# Patient Record
Sex: Female | Born: 1965 | Race: White | Hispanic: No | Marital: Married | Smoking: Current every day smoker
Health system: Southern US, Community
[De-identification: ages and names within clinical notes are randomized; demographics above are authoritative.]

## PROBLEM LIST (undated history)

## (undated) DIAGNOSIS — F32A Depression, unspecified: Secondary | ICD-10-CM

## (undated) DIAGNOSIS — F1020 Alcohol dependence, uncomplicated: Secondary | ICD-10-CM

## (undated) DIAGNOSIS — J948 Other specified pleural conditions: Secondary | ICD-10-CM

## (undated) DIAGNOSIS — F329 Major depressive disorder, single episode, unspecified: Secondary | ICD-10-CM

## (undated) DIAGNOSIS — Z9289 Personal history of other medical treatment: Secondary | ICD-10-CM

## (undated) DIAGNOSIS — D649 Anemia, unspecified: Secondary | ICD-10-CM

## (undated) DIAGNOSIS — R011 Cardiac murmur, unspecified: Secondary | ICD-10-CM

## (undated) DIAGNOSIS — K7031 Alcoholic cirrhosis of liver with ascites: Principal | ICD-10-CM

## (undated) DIAGNOSIS — F419 Anxiety disorder, unspecified: Secondary | ICD-10-CM

## (undated) HISTORY — DX: Other specified pleural conditions: J94.8

## (undated) HISTORY — PX: REFRACTIVE SURGERY: SHX103

## (undated) HISTORY — DX: Alcohol dependence, uncomplicated: F10.20

## (undated) HISTORY — DX: Alcoholic cirrhosis of liver with ascites: K70.31

## (undated) HISTORY — PX: EYE SURGERY: SHX253

## (undated) HISTORY — PX: WISDOM TOOTH EXTRACTION: SHX21

---

## 1997-10-13 ENCOUNTER — Other Ambulatory Visit: Admission: RE | Admit: 1997-10-13 | Discharge: 1997-10-13 | Payer: Self-pay | Admitting: Gynecology

## 1999-01-06 ENCOUNTER — Other Ambulatory Visit: Admission: RE | Admit: 1999-01-06 | Discharge: 1999-01-06 | Payer: Self-pay | Admitting: Gynecology

## 2001-04-13 ENCOUNTER — Other Ambulatory Visit: Admission: RE | Admit: 2001-04-13 | Discharge: 2001-04-13 | Payer: Self-pay | Admitting: Gynecology

## 2001-08-08 HISTORY — PX: FERTILITY SURGERY: SHX945

## 2004-10-18 ENCOUNTER — Other Ambulatory Visit: Admission: RE | Admit: 2004-10-18 | Discharge: 2004-10-18 | Payer: Self-pay | Admitting: Gynecology

## 2005-12-08 ENCOUNTER — Other Ambulatory Visit: Admission: RE | Admit: 2005-12-08 | Discharge: 2005-12-08 | Payer: Self-pay | Admitting: Gynecology

## 2007-03-26 ENCOUNTER — Other Ambulatory Visit: Admission: RE | Admit: 2007-03-26 | Discharge: 2007-03-26 | Payer: Self-pay | Admitting: Gynecology

## 2008-04-03 ENCOUNTER — Other Ambulatory Visit: Admission: RE | Admit: 2008-04-03 | Discharge: 2008-04-03 | Payer: Self-pay | Admitting: Gynecology

## 2017-01-06 DIAGNOSIS — Z9289 Personal history of other medical treatment: Secondary | ICD-10-CM

## 2017-01-06 HISTORY — DX: Personal history of other medical treatment: Z92.89

## 2017-01-06 HISTORY — PX: COLONOSCOPY: SHX174

## 2017-01-06 HISTORY — PX: ESOPHAGOGASTRODUODENOSCOPY: SHX1529

## 2017-01-18 ENCOUNTER — Other Ambulatory Visit: Payer: Self-pay | Admitting: Family Medicine

## 2017-01-18 ENCOUNTER — Encounter: Payer: Self-pay | Admitting: Physician Assistant

## 2017-01-18 DIAGNOSIS — R945 Abnormal results of liver function studies: Secondary | ICD-10-CM

## 2017-01-18 DIAGNOSIS — R7989 Other specified abnormal findings of blood chemistry: Secondary | ICD-10-CM

## 2017-01-18 DIAGNOSIS — R160 Hepatomegaly, not elsewhere classified: Secondary | ICD-10-CM

## 2017-01-19 ENCOUNTER — Encounter (HOSPITAL_COMMUNITY): Payer: Self-pay | Admitting: Nurse Practitioner

## 2017-01-19 ENCOUNTER — Observation Stay (HOSPITAL_COMMUNITY)
Admission: EM | Admit: 2017-01-19 | Discharge: 2017-01-20 | Disposition: A | Discharge status: Home / Self Care | Payer: 59 | Attending: Internal Medicine | Admitting: Internal Medicine

## 2017-01-19 DIAGNOSIS — F1020 Alcohol dependence, uncomplicated: Secondary | ICD-10-CM | POA: Diagnosis not present

## 2017-01-19 DIAGNOSIS — J948 Other specified pleural conditions: Secondary | ICD-10-CM | POA: Diagnosis present

## 2017-01-19 DIAGNOSIS — E871 Hypo-osmolality and hyponatremia: Secondary | ICD-10-CM | POA: Diagnosis not present

## 2017-01-19 DIAGNOSIS — K7030 Alcoholic cirrhosis of liver without ascites: Secondary | ICD-10-CM | POA: Diagnosis present

## 2017-01-19 DIAGNOSIS — E43 Unspecified severe protein-calorie malnutrition: Secondary | ICD-10-CM | POA: Diagnosis present

## 2017-01-19 DIAGNOSIS — R197 Diarrhea, unspecified: Secondary | ICD-10-CM | POA: Diagnosis present

## 2017-01-19 DIAGNOSIS — K7031 Alcoholic cirrhosis of liver with ascites: Secondary | ICD-10-CM

## 2017-01-19 DIAGNOSIS — R7989 Other specified abnormal findings of blood chemistry: Secondary | ICD-10-CM | POA: Insufficient documentation

## 2017-01-19 DIAGNOSIS — E860 Dehydration: Secondary | ICD-10-CM

## 2017-01-19 DIAGNOSIS — F17210 Nicotine dependence, cigarettes, uncomplicated: Secondary | ICD-10-CM | POA: Insufficient documentation

## 2017-01-19 DIAGNOSIS — R531 Weakness: Secondary | ICD-10-CM

## 2017-01-19 DIAGNOSIS — D649 Anemia, unspecified: Secondary | ICD-10-CM | POA: Diagnosis not present

## 2017-01-19 DIAGNOSIS — Z79899 Other long term (current) drug therapy: Secondary | ICD-10-CM | POA: Diagnosis not present

## 2017-01-19 DIAGNOSIS — G4700 Insomnia, unspecified: Secondary | ICD-10-CM | POA: Insufficient documentation

## 2017-01-19 DIAGNOSIS — R945 Abnormal results of liver function studies: Secondary | ICD-10-CM

## 2017-01-19 DIAGNOSIS — E876 Hypokalemia: Secondary | ICD-10-CM | POA: Diagnosis not present

## 2017-01-19 DIAGNOSIS — K759 Inflammatory liver disease, unspecified: Secondary | ICD-10-CM | POA: Diagnosis not present

## 2017-01-19 HISTORY — DX: Alcoholic cirrhosis of liver with ascites: K70.31

## 2017-01-19 HISTORY — DX: Anemia, unspecified: D64.9

## 2017-01-19 HISTORY — DX: Anxiety disorder, unspecified: F41.9

## 2017-01-19 LAB — COMPREHENSIVE METABOLIC PANEL
ALT: 29 U/L (ref 14–54)
ANION GAP: 9 (ref 5–15)
AST: 129 U/L — ABNORMAL HIGH (ref 15–41)
Albumin: 2.8 g/dL — ABNORMAL LOW (ref 3.5–5.0)
Alkaline Phosphatase: 139 U/L — ABNORMAL HIGH (ref 38–126)
BUN: <5 mg/dL — ABNORMAL LOW (ref 6–20)
CHLORIDE: 101 mmol/L (ref 101–111)
CO2: 21 mmol/L — AB (ref 22–32)
CREATININE: 0.61 mg/dL (ref 0.44–1.00)
Calcium: 8.4 mg/dL — ABNORMAL LOW (ref 8.9–10.3)
GFR calc non Af Amer: >60 mL/min (ref >60)
GLUCOSE: 137 mg/dL — AB (ref 65–99)
Potassium: 3.4 mmol/L — ABNORMAL LOW (ref 3.5–5.1)
SODIUM: 131 mmol/L — AB (ref 135–145)
Total Bilirubin: 2.8 mg/dL — ABNORMAL HIGH (ref 0.3–1.2)
Total Protein: 7.3 g/dL (ref 6.5–8.1)

## 2017-01-19 LAB — CBC
HEMATOCRIT: 23.4 % — AB (ref 36.0–46.0)
HEMOGLOBIN: 7 g/dL — AB (ref 12.0–15.0)
MCH: 26.6 pg (ref 26.0–34.0)
MCHC: 29.9 g/dL — ABNORMAL LOW (ref 30.0–36.0)
MCV: 89 fL (ref 78.0–100.0)
Platelets: 262 10*3/uL (ref 150–400)
RBC: 2.63 MIL/uL — AB (ref 3.87–5.11)
RDW: 16.6 % — AB (ref 11.5–15.5)
WBC: 8.7 10*3/uL (ref 4.0–10.5)

## 2017-01-19 LAB — BILIRUBIN, FRACTIONATED(TOT/DIR/INDIR)
BILIRUBIN DIRECT: 1.3 mg/dL — AB (ref 0.1–0.5)
BILIRUBIN INDIRECT: 1.6 mg/dL — AB (ref 0.3–0.9)
BILIRUBIN TOTAL: 2.9 mg/dL — AB (ref 0.3–1.2)

## 2017-01-19 LAB — TSH: TSH: 2.591 u[IU]/mL (ref 0.350–4.500)

## 2017-01-19 LAB — IRON AND TIBC
IRON: 36 ug/dL (ref 28–170)
SATURATION RATIOS: 15 % (ref 10.4–31.8)
TIBC: 245 ug/dL — AB (ref 250–450)
UIBC: 209 ug/dL

## 2017-01-19 LAB — PROTIME-INR
INR: 1.42
Prothrombin Time: 17.5 seconds — ABNORMAL HIGH (ref 11.4–15.2)

## 2017-01-19 LAB — FERRITIN: Ferritin: 42 ng/mL (ref 11–307)

## 2017-01-19 LAB — RETICULOCYTES
RBC.: 2.5 MIL/uL — AB (ref 3.87–5.11)
Retic Count, Absolute: 42.5 10*3/uL (ref 19.0–186.0)
Retic Ct Pct: 1.7 % (ref 0.4–3.1)

## 2017-01-19 LAB — VITAMIN B12: Vitamin B-12: 806 pg/mL (ref 180–914)

## 2017-01-19 LAB — LACTATE DEHYDROGENASE: LDH: 155 U/L (ref 98–192)

## 2017-01-19 LAB — I-STAT TROPONIN, ED: Troponin i, poc: 0.01 ng/mL (ref 0.00–0.08)

## 2017-01-19 LAB — FOLATE: Folate: 8.7 ng/mL (ref >5.9)

## 2017-01-19 LAB — ABO/RH: ABO/RH(D): A POS

## 2017-01-19 LAB — OSMOLALITY: Osmolality: 285 mOsm/kg (ref 275–295)

## 2017-01-19 MED ORDER — SODIUM CHLORIDE 0.9 % IV SOLN
INTRAVENOUS | Status: AC
  Administered 2017-01-19: 20:00:00 via INTRAVENOUS

## 2017-01-19 MED ORDER — ONDANSETRON HCL 4 MG PO TABS: 4.0000 mg | ORAL_TABLET | Freq: Four times a day (QID) | ORAL | Status: DC | PRN

## 2017-01-19 MED ORDER — ESCITALOPRAM OXALATE 10 MG PO TABS
20.0000 mg | ORAL_TABLET | Freq: Every day | ORAL | Status: DC
  Administered 2017-01-20: 20 mg via ORAL
  Filled 2017-01-19: qty 2

## 2017-01-19 MED ORDER — ENSURE ENLIVE PO LIQD
237.0000 mL | Freq: Two times a day (BID) | ORAL | Status: DC
  Administered 2017-01-20: 237 mL via ORAL

## 2017-01-19 MED ORDER — ONDANSETRON HCL 4 MG/2ML IJ SOLN: 4.0000 mg | Freq: Four times a day (QID) | INTRAMUSCULAR | Status: DC | PRN

## 2017-01-19 NOTE — ED Provider Notes (Signed)
[facility]-EMERGENCY DEPT Provider Note   CSN: [id number] Arrival date & time: 01/19/17  1608     History   Chief Complaint Chief Complaint  Patient presents with  . Anemia    HPI Rhonda Haley is a 51 y.o. female.  The history is provided by the patient and medical records. No language interpreter was used.  Illness  This is a chronic problem. The current episode started more than 1 week ago. The problem occurs daily. The problem has not changed since onset.Pertinent negatives include no chest pain, no abdominal pain and no shortness of breath. Nothing aggravates the symptoms. The symptoms are relieved by medications.    Past Medical History:  Diagnosis Date  . Anxiety   . Symptomatic anemia 01/19/2017    Patient Active Problem List   Diagnosis Date Noted  . Diarrhea   . Symptomatic anemia 01/19/2017  . Hyponatremia 01/19/2017  . Hypokalemia 01/19/2017  . Elevated LFTs 01/19/2017    Past Surgical History:  Procedure Laterality Date  . FERTILITY SURGERY  2003   in vitro fertilization    OB History    No data available       Home Medications    Prior to Admission medications   Medication Sig Start Date End Date Taking? Authorizing Provider  diphenhydramine-acetaminophen (TYLENOL PM) 25-500 MG TABS tablet Take 1-2 tablets by mouth at bedtime as needed (pain).    Yes [provider]  escitalopram (LEXAPRO) 20 MG tablet Take 20 mg by mouth daily. 01/18/17  Yes [provider]  loratadine (CLARITIN) 10 MG tablet Take 10 mg by mouth daily as needed for allergies.   Yes [provider]  feeding supplement, ENSURE ENLIVE, (ENSURE ENLIVE) LIQD Take 237 mLs by mouth 2 (two) times daily between meals. 01/21/17   Ghimire, Werner LeanShanker M, MD  ferrous sulfate 325 (65 FE) MG tablet Take 1 tablet (325 mg total) by mouth 2 (two) times daily with a meal. 01/20/17   Ghimire, Werner LeanShanker M, MD  loperamide (IMODIUM A-D) 2 MG tablet Take 1 tablet (2 mg total) by mouth 3  (three) times daily as needed for diarrhea or loose stools. 01/20/17   Ghimire, Werner LeanShanker M, MD  thiamine (VITAMIN B-1) 100 MG tablet Take 1 tablet (100 mg total) by mouth daily. 01/20/17   Ghimire, Werner LeanShanker M, MD    Family History Family History  Problem Relation Age of Onset  . Brain cancer Mother     Social History Social History  Substance Use Topics  . Smoking status: Current Every Day Smoker    Packs/day: 0.50    Years: 28.00    Types: Cigarettes  . Smokeless tobacco: Never Used  . Alcohol use Yes     Comment: 01/19/2017 "1 bottle of wine/week; quit 1 wk ago"     Allergies   Patient has no known allergies.   Review of Systems Review of Systems  Constitutional: Positive for fatigue. Negative for chills and fever.  HENT: Negative for ear pain and sore throat.   Eyes: Negative for pain and visual disturbance.  Respiratory: Negative for cough and shortness of breath.   Cardiovascular: Negative for chest pain and palpitations.  Gastrointestinal: Positive for diarrhea. Negative for abdominal pain and vomiting.  Genitourinary: Negative for dysuria and hematuria.  Musculoskeletal: Negative for arthralgias and back pain.  Skin: Negative for color change and rash.  Neurological: Positive for dizziness. Negative for seizures and syncope.  All other systems reviewed and are negative.    Physical Exam  Updated Vital Signs BP 118/69 (BP Location: Right Arm)   Pulse (!) 106   Temp 98.5 F (36.9 C) (Oral)   Resp (!) 24   Ht 5\' 8"  (1.727 m)   Wt 63.9 kg (140 lb 14 oz)   SpO2 99%   BMI 21.42 kg/m   Physical Exam  Constitutional: She appears well-developed. No distress.  HENT:  Head: Normocephalic and atraumatic.  Eyes: Conjunctivae are normal.  Neck: Neck supple.  Cardiovascular: Normal rate and regular rhythm.   No murmur heard. Pulmonary/Chest: Effort normal and breath sounds normal. No respiratory distress.  Abdominal: Soft. There is no tenderness.  Genitourinary:  Rectal exam shows guaiac negative stool.  Musculoskeletal: She exhibits no edema.  Neurological: She is alert. No cranial nerve deficit. Coordination normal.  5/5 motor strength and intact sensation in all extremities. Finger-to-nose intact bilaterally  Skin: Skin is warm and dry.  Nursing note and vitals reviewed.    ED Treatments / Results  Labs (all labs ordered are listed, but only abnormal results are displayed) Labs Reviewed  CBC - Abnormal; Notable for the following:       Result Value   RBC 2.63 (*)    Hemoglobin 7.0 (*)    HCT 23.4 (*)    MCHC 29.9 (*)    RDW 16.6 (*)    All other components within normal limits  COMPREHENSIVE METABOLIC PANEL - Abnormal; Notable for the following:    Sodium 131 (*)    Potassium 3.4 (*)    CO2 21 (*)    Glucose, Bld 137 (*)    BUN <5 (*)    Calcium 8.4 (*)    Albumin 2.8 (*)    AST 129 (*)    Alkaline Phosphatase 139 (*)    Total Bilirubin 2.8 (*)    All other components within normal limits  IRON AND TIBC - Abnormal; Notable for the following:    TIBC 245 (*)    All other components within normal limits  RETICULOCYTES - Abnormal; Notable for the following:    RBC. 2.50 (*)    All other components within normal limits  PROTIME-INR - Abnormal; Notable for the following:    Prothrombin Time 17.5 (*)    All other components within normal limits  BILIRUBIN, FRACTIONATED(TOT/DIR/INDIR) - Abnormal; Notable for the following:    Total Bilirubin 2.9 (*)    Bilirubin, Direct 1.3 (*)    Indirect Bilirubin 1.6 (*)    All other components within normal limits  COMPREHENSIVE METABOLIC PANEL - Abnormal; Notable for the following:    BUN <5 (*)    Calcium 8.2 (*)    Albumin 2.5 (*)    AST 117 (*)    Alkaline Phosphatase 127 (*)    Total Bilirubin 2.8 (*)    All other components within normal limits  CBC - Abnormal; Notable for the following:    RBC 2.50 (*)    Hemoglobin 6.6 (*)    HCT 22.3 (*)    MCHC 29.6 (*)    RDW 16.7 (*)     All other components within normal limits  CBC - Abnormal; Notable for the following:    RBC 2.90 (*)    Hemoglobin 7.9 (*)    HCT 25.3 (*)    RDW 16.4 (*)    All other components within normal limits  GASTROINTESTINAL PANEL BY PCR, STOOL (REPLACES STOOL CULTURE)  FERRITIN  TSH  FOLATE  VITAMIN B12  HIV ANTIBODY (ROUTINE TESTING)  LACTATE  DEHYDROGENASE  HAPTOGLOBIN  OSMOLALITY  SODIUM, URINE, RANDOM  HEPATITIS PANEL, ACUTE  OSMOLALITY, URINE  OCCULT BLOOD X 1 CARD TO LAB, STOOL  CBC  I-STAT TROPOININ, ED  POC OCCULT BLOOD, ED  TYPE AND SCREEN  ABO/RH  PREPARE RBC (CROSSMATCH)    EKG  EKG Interpretation  Date/Time:  Thursday January 19 2017 16:42:03 EDT Ventricular Rate:  127 PR Interval:    QRS Duration: 72 QT Interval:  328 QTC Calculation: 476 R Axis:   74 Text Interpretation:  Accelerated Junctional rhythm with Premature ventricular complexes or Fusion complexes Cannot rule out Anterior infarct , age undetermined Abnormal ECG No acute changes Confirmed by Derwood Kaplan [phone]) on 01/19/2017 6:31:30 PM Also confirmed by Derwood Kaplan [phone]), editor Elita Quick ([id number])  on 01/20/2017 6:38:30 AM       Radiology US Abdomen Complete  Result Date: 01/20/2017 CLINICAL DATA:  Elevated liver function studies, anemia. EXAM: ABDOMEN ULTRASOUND COMPLETE COMPARISON:  None in PACs FINDINGS: Gallbladder: The gallbladder is adequately distended. There are echogenic mobile shadowing stones measuring up to 1 cm in diameter. The gallbladder wall is top normal in thickness at 3 mm. There is no positive sonographic Murphy's sign. There is a trace of fluid adjacent to the gallbladder. Common bile duct: Diameter: 4.6 mm Liver: The hepatic echotexture is heterogeneous. The surface contour is irregular. There is no discrete mass or ductal dilation. There is ascites surrounding the liver. IVC: No abnormality visualized. Pancreas: Visualization of the pancreatic tail is limited due to  bowel gas. Spleen: The spleen is mildly enlarged measuring 12.6 x 11.2 x 5.7 cm. The calculated volume is 420 cc. Right Kidney: Length: 11.7 cm. Echogenicity within normal limits. No mass or hydronephrosis visualized. Left Kidney: Length: 11.4 cm. Echogenicity within normal limits. No mass or hydronephrosis visualized. Abdominal aorta: Visualization of the abdominal aorta is limited due to bowel gas. Other findings: There is a right pleural effusion. IMPRESSION: Abnormal appearance of the liver worrisome for cirrhosis or other hepatocellular infiltrative process. Right pleural effusion. Small amount of ascites. Splenomegaly. Hepatic protocol MRI is recommended. Gallstones without sonographic evidence of acute cholecystitis. Limited visualization of the pancreatic tail. Electronically Signed   By: David  [place] M.D.   On: 01/20/2017 07:52    Procedures Procedures (including critical care time)  Medications Ordered in ED Medications  escitalopram (LEXAPRO) tablet 20 mg (20 mg Oral Given 01/20/17 1114)  ondansetron (ZOFRAN) tablet 4 mg (not administered)    Or  ondansetron (ZOFRAN) injection 4 mg (not administered)  0.9 %  sodium chloride infusion ( Intravenous New Bag/Given 01/19/17 2029)  feeding supplement (ENSURE ENLIVE) (ENSURE ENLIVE) liquid 237 mL (237 mLs Oral Not Given 01/20/17 1535)  0.9 %  sodium chloride infusion ( Intravenous New Bag/Given 01/20/17 0855)  acetaminophen (TYLENOL) tablet 650 mg (650 mg Oral Given 01/20/17 0850)  diphenhydrAMINE (BENADRYL) injection 25 mg (25 mg Intravenous Given 01/20/17 0849)  furosemide (LASIX) injection 20 mg (20 mg Intravenous Given 01/20/17 0850)     Initial Impression / Assessment and Plan / ED Course  I have reviewed the triage vital signs and the nursing notes.  Pertinent labs & imaging results that were available during my care of the patient were reviewed by me and considered in my medical decision making (see chart for details).     78 yoF  sent by [facility] PCP for symptomatic anemia. Onset of diarrhea over past month after mother was ill. Diarrhea typical after stress. However, diarrhea not improving like  normal. Pt with generalized fatigue, lightheadedness, and nausea.   AF, tachycardic 120s, otherwise VSS. No focal neuro deficits. Lungs CTAB. Abdomen soft, benign throughout. Green fecal matter on recal exam, no gross blood. HemOccult negative.  Unclear etiology for normocytic anemia. Pt admitted for symptomatic anemia. Pt stable at time of transfer.  Final Clinical Impressions(s) / ED Diagnoses   Final diagnoses:  Elevated LFTs    New Prescriptions Discharge Medication List as of 01/20/2017  6:34 PM    START taking these medications   Details  feeding supplement, ENSURE ENLIVE, (ENSURE ENLIVE) LIQD Take 237 mLs by mouth 2 (two) times daily between meals., Starting Sat 01/21/2017, Print    ferrous sulfate 325 (65 FE) MG tablet Take 1 tablet (325 mg total) by mouth 2 (two) times daily with a meal., Starting Fri 01/20/2017, Print    loperamide (IMODIUM A-D) 2 MG tablet Take 1 tablet (2 mg total) by mouth 3 (three) times daily as needed for diarrhea or loose stools., Starting Fri 01/20/2017, Print    thiamine (VITAMIN B-1) 100 MG tablet Take 1 tablet (100 mg total) by mouth daily., Starting Fri 01/20/2017, Print         Hebert Soho, MD 01/20/17 1610    Derwood Kaplan, MD 01/22/17 [phone]

## 2017-01-19 NOTE — ED Notes (Signed)
Admitting at bedside 

## 2017-01-19 NOTE — ED Notes (Signed)
EDP at bedside. MD aware of Hgb 7.0 and occult card at bedside for MD.

## 2017-01-19 NOTE — ED Triage Notes (Signed)
Pt presents with c/o anemia. [facility] after her lab work returned with low hemoglobin. She saw Dr Duanne Guessewey in the office recently for a several month history of fatigue, tachycardia and diarrhea. She denies any fevers, dizziness,  shortness of breath, nausea, vomiting, pain. Her symptoms began after a period of increased stress related to her mother being ill.

## 2017-01-20 ENCOUNTER — Observation Stay (HOSPITAL_COMMUNITY): Payer: 59

## 2017-01-20 DIAGNOSIS — R197 Diarrhea, unspecified: Secondary | ICD-10-CM

## 2017-01-20 DIAGNOSIS — D649 Anemia, unspecified: Secondary | ICD-10-CM

## 2017-01-20 DIAGNOSIS — K7031 Alcoholic cirrhosis of liver with ascites: Secondary | ICD-10-CM | POA: Diagnosis not present

## 2017-01-20 DIAGNOSIS — R7989 Other specified abnormal findings of blood chemistry: Secondary | ICD-10-CM

## 2017-01-20 LAB — COMPREHENSIVE METABOLIC PANEL
ALBUMIN: 2.5 g/dL — AB (ref 3.5–5.0)
ALK PHOS: 127 U/L — AB (ref 38–126)
ALT: 27 U/L (ref 14–54)
ANION GAP: 9 (ref 5–15)
AST: 117 U/L — AB (ref 15–41)
BUN: <5 mg/dL — ABNORMAL LOW (ref 6–20)
CO2: 24 mmol/L (ref 22–32)
Calcium: 8.2 mg/dL — ABNORMAL LOW (ref 8.9–10.3)
Chloride: 102 mmol/L (ref 101–111)
Creatinine, Ser: 0.53 mg/dL (ref 0.44–1.00)
GFR calc Af Amer: >60 mL/min (ref >60)
GFR calc non Af Amer: >60 mL/min (ref >60)
Glucose, Bld: 99 mg/dL (ref 65–99)
POTASSIUM: 3.8 mmol/L (ref 3.5–5.1)
SODIUM: 135 mmol/L (ref 135–145)
Total Bilirubin: 2.8 mg/dL — ABNORMAL HIGH (ref 0.3–1.2)
Total Protein: 6.9 g/dL (ref 6.5–8.1)

## 2017-01-20 LAB — GASTROINTESTINAL PANEL BY PCR, STOOL (REPLACES STOOL CULTURE)
ADENOVIRUS F40/41: NOT DETECTED
Astrovirus: NOT DETECTED
CRYPTOSPORIDIUM: NOT DETECTED
CYCLOSPORA CAYETANENSIS: NOT DETECTED
Campylobacter species: NOT DETECTED
ENTEROAGGREGATIVE E COLI (EAEC): NOT DETECTED
ENTEROPATHOGENIC E COLI (EPEC): NOT DETECTED
Entamoeba histolytica: NOT DETECTED
Enterotoxigenic E coli (ETEC): NOT DETECTED
GIARDIA LAMBLIA: NOT DETECTED
Norovirus GI/GII: NOT DETECTED
PLESIMONAS SHIGELLOIDES: NOT DETECTED
Rotavirus A: NOT DETECTED
Salmonella species: NOT DETECTED
Sapovirus (I, II, IV, and V): NOT DETECTED
Shiga like toxin producing E coli (STEC): NOT DETECTED
Shigella/Enteroinvasive E coli (EIEC): NOT DETECTED
VIBRIO SPECIES: NOT DETECTED
Vibrio cholerae: NOT DETECTED
YERSINIA ENTEROCOLITICA: NOT DETECTED

## 2017-01-20 LAB — CBC
HCT: 25.3 % — ABNORMAL LOW (ref 36.0–46.0)
HEMATOCRIT: 22.3 % — AB (ref 36.0–46.0)
HEMOGLOBIN: 7.9 g/dL — AB (ref 12.0–15.0)
Hemoglobin: 6.6 g/dL — CL (ref 12.0–15.0)
MCH: 26.4 pg (ref 26.0–34.0)
MCH: 27.2 pg (ref 26.0–34.0)
MCHC: 29.6 g/dL — AB (ref 30.0–36.0)
MCHC: 31.2 g/dL (ref 30.0–36.0)
MCV: 89.2 fL (ref 78.0–100.0)
MCV: 87.2 fL (ref 78.0–100.0)
Platelets: 245 10*3/uL (ref 150–400)
Platelets: 237 10*3/uL (ref 150–400)
RBC: 2.5 MIL/uL — ABNORMAL LOW (ref 3.87–5.11)
RBC: 2.9 MIL/uL — ABNORMAL LOW (ref 3.87–5.11)
RDW: 16.7 % — ABNORMAL HIGH (ref 11.5–15.5)
RDW: 16.4 % — ABNORMAL HIGH (ref 11.5–15.5)
WBC: 8.6 10*3/uL (ref 4.0–10.5)
WBC: 8.9 10*3/uL (ref 4.0–10.5)

## 2017-01-20 LAB — HEPATITIS PANEL, ACUTE
HCV Ab: 0.1 s/co ratio (ref 0.0–0.9)
Hep A IgM: NEGATIVE
Hep B C IgM: NEGATIVE
Hepatitis B Surface Ag: NEGATIVE

## 2017-01-20 LAB — SODIUM, URINE, RANDOM: Sodium, Ur: 40 mmol/L

## 2017-01-20 LAB — PREPARE RBC (CROSSMATCH)

## 2017-01-20 LAB — HIV ANTIBODY (ROUTINE TESTING W REFLEX): HIV SCREEN 4TH GENERATION: NONREACTIVE

## 2017-01-20 LAB — OSMOLALITY, URINE: Osmolality, Ur: 584 mOsm/kg (ref 300–900)

## 2017-01-20 LAB — HAPTOGLOBIN: Haptoglobin: 152 mg/dL (ref 34–200)

## 2017-01-20 LAB — OCCULT BLOOD X 1 CARD TO LAB, STOOL: FECAL OCCULT BLD: NEGATIVE

## 2017-01-20 MED ORDER — ENSURE ENLIVE PO LIQD: 237.0000 mL | Freq: Two times a day (BID) | ORAL | 0 refills | Status: AC

## 2017-01-20 MED ORDER — FUROSEMIDE 10 MG/ML IJ SOLN
20.0000 mg | Freq: Once | INTRAMUSCULAR | Status: AC
  Administered 2017-01-20: 20 mg via INTRAVENOUS
  Filled 2017-01-20: qty 2

## 2017-01-20 MED ORDER — DIPHENHYDRAMINE HCL 50 MG/ML IJ SOLN
25.0000 mg | Freq: Once | INTRAMUSCULAR | Status: AC
  Administered 2017-01-20: 25 mg via INTRAVENOUS
  Filled 2017-01-20: qty 1

## 2017-01-20 MED ORDER — SODIUM CHLORIDE 0.9 % IV SOLN
Freq: Once | INTRAVENOUS | Status: AC
  Administered 2017-01-20: 09:00:00 via INTRAVENOUS

## 2017-01-20 MED ORDER — LOPERAMIDE HCL 2 MG PO TABS: 2.0000 mg | ORAL_TABLET | Freq: Three times a day (TID) | ORAL | 0 refills | Status: DC | PRN

## 2017-01-20 MED ORDER — ACETAMINOPHEN 325 MG PO TABS
650.0000 mg | ORAL_TABLET | Freq: Once | ORAL | Status: AC
  Administered 2017-01-20: 650 mg via ORAL
  Filled 2017-01-20: qty 2

## 2017-01-20 MED ORDER — VITAMIN B-1 100 MG PO TABS: 100.0000 mg | ORAL_TABLET | Freq: Every day | ORAL | 0 refills | Status: DC

## 2017-01-20 MED ORDER — FERROUS SULFATE 325 (65 FE) MG PO TABS: 325.0000 mg | ORAL_TABLET | Freq: Two times a day (BID) | ORAL | 0 refills | Status: DC

## 2017-01-20 NOTE — Progress Notes (Signed)
Per report from ED nurse, anticipating transfusion of blood for patient. No active orders for blood transfusion. On call notified to clarify about blood transfusion.

## 2017-01-20 NOTE — Progress Notes (Signed)
PROGRESS NOTE [facility]   Rhonda Haley   WUJ:[id number] DOB: July 12, 1966  DOA: 01/19/2017 PCP: [facility], Rhonda Haley   Brief Narrative:  51 year-old female with significant Hx for anxiety and heavy drinking admitted for anemia. Presented with Non-bloody/nonmelanotic diarrhea, general malaise, fatigue, insomnia. No epistaxis, hemoptysis, hematuria, hematochezia, hematemesis. See below for further details.    Assessment & Plan:  1. Symptomatic anemia:  Unclear cause, hypoproliferative vs chronic blood loss.  Nothing in history to suggest acute blood loss.   -Retic Count 42.5, Iron 36, TIBC 245, B12 806,LDH 155, haptoglobin 152, FOBT- negative -Discuss endoscopy with GI as inpatient vs outpatient  2. Hepatitis:  -hepatitis serologies- Negative -T.Bili 2.8, AST 117, ALT 27, INR 1.42, albumin 2.5 -RUQ US- see results below -MRI- recommended -Consult GI  3. Hyponatremia:  -Resolved -continue to monitor  4. Hypokalemia:  -Resolved -continue to monitor  5. Anxiety:  -Continue escitalopram  DVT prophylaxis: SCDs  Code Status: FULL  Family Communication: None at bedside Disposition Plan: Pending results  Subjective: Patient states she's had increasing fatigue for a few weeks with intermittent SOB/cough and non-bloody diarrhea, until the past week when she experienced diarrhea daily. She states she is feeling better since being admitted yesterday but still has fatigue and diarrhea (x4 this morning). Patient denies N/V, chest pain, hemoptysis, hematochezia, hematemesis.   Objective: Vitals:   01/19/17 1915 01/19/17 1945 01/19/17 2031 01/20/17 0456  BP: (!) 142/74 132/72 120/68 111/67  Pulse: (!) 117 (!) 118 (!) 123 (!) 110  Resp:   18 18  Temp:   99.4 F (37.4 C) 98.7 F (37.1 C)  TempSrc:   Oral Oral  SpO2: 92% 96% 95% 99%  Weight:   140 lb 14 oz (63.9 kg)   Height:   5\' 8"  (1.727 m)     Intake/Output Summary (Last 24 hours) at 01/20/17 0905 Last data  filed at 01/20/17 [id number]  Gross per 24 hour  Intake              950 ml  Output                0 ml  Net              950 ml   Filed Weights   01/19/17 1635 01/19/17 2031  Weight: 141 lb (64 kg) 140 lb 14 oz (63.9 kg)    Examination:  General exam: Appears slightly anxious but in no acute distress Respiratory system: Clear to auscultation. No wheezes,crackle or rhonchi Cardiovascular system: S1 & S2 heard, RRR. No JVD, murmurs, rubs or gallops Gastrointestinal system: Abdomen positive for distention, nontender. Suspected hepatomegaly  Central nervous system: Alert and oriented. No focal neurological deficits. Extremities: No pedal edema. Skin: No jaundice, no lacerations or ulcerations Psychiatry: Judgement and insight appear normal. Mood & affect appropriate.    Data Reviewed: I have personally reviewed following labs and imaging studies  CBC:  Recent Labs Lab 01/19/17 1646 01/20/17 0458  WBC 8.7 8.6  HGB 7.0* 6.6*  HCT 23.4* 22.3*  MCV 89.0 89.2  PLT 262 245   Basic Metabolic Panel:  Recent Labs Lab 01/19/17 1646 01/20/17 0458  NA 131* 135  K 3.4* 3.8  CL 101 102  CO2 21* 24  GLUCOSE 137* 99  BUN <5* <5*  CREATININE 0.61 0.53  CALCIUM 8.4* 8.2*   GFR: Estimated Creatinine Clearance: 82 mL/min (by C-G formula based on SCr of 0.53 mg/dL). Liver Function Tests:  Recent Labs Lab  01/19/17 1646 01/19/17 2010 01/20/17 0458  AST 129*  --  117*  ALT 29  --  27  ALKPHOS 139*  --  127*  BILITOT 2.8* 2.9* 2.8*  PROT 7.3  --  6.9  ALBUMIN 2.8*  --  2.5*   No results for input(s): LIPASE, AMYLASE in the last 168 hours. No results for input(s): AMMONIA in the last 168 hours. Coagulation Profile:  Recent Labs Lab 01/19/17 2010  INR 1.42   Cardiac Enzymes: No results for input(s): CKTOTAL, CKMB, CKMBINDEX, TROPONINI in the last 168 hours. BNP (last 3 results) No results for input(s): PROBNP in the last 8760 hours. HbA1C: No results for input(s): HGBA1C  in the last 72 hours. CBG: No results for input(s): GLUCAP in the last 168 hours. Lipid Profile: No results for input(s): CHOL, HDL, LDLCALC, TRIG, CHOLHDL, LDLDIRECT in the last 72 hours. Thyroid Function Tests:  Recent Labs  01/19/17 2010  TSH 2.591   Anemia Panel:  Recent Labs  01/19/17 2010  VITAMINB12 806  FOLATE 8.7  FERRITIN 42  TIBC 245*  IRON 36  RETICCTPCT 1.7   Sepsis Labs: No results for input(s): PROCALCITON, LATICACIDVEN in the last 168 hours.  No results found for this or any previous visit (from the past 240 hour(s)).       Radiology Studies: US Abdomen Complete  Result Date: 01/20/2017 CLINICAL DATA:  Elevated liver function studies, anemia. EXAM: ABDOMEN ULTRASOUND COMPLETE COMPARISON:  None in PACs FINDINGS: Gallbladder: The gallbladder is adequately distended. There are echogenic mobile shadowing stones measuring up to 1 cm in diameter. The gallbladder wall is top normal in thickness at 3 mm. There is no positive sonographic Murphy's sign. There is a trace of fluid adjacent to the gallbladder. Common bile duct: Diameter: 4.6 mm Liver: The hepatic echotexture is heterogeneous. The surface contour is irregular. There is no discrete mass or ductal dilation. There is ascites surrounding the liver. IVC: No abnormality visualized. Pancreas: Visualization of the pancreatic tail is limited due to bowel gas. Spleen: The spleen is mildly enlarged measuring 12.6 x 11.2 x 5.7 cm. The calculated volume is 420 cc. Right Kidney: Length: 11.7 cm. Echogenicity within normal limits. No mass or hydronephrosis visualized. Left Kidney: Length: 11.4 cm. Echogenicity within normal limits. No mass or hydronephrosis visualized. Abdominal aorta: Visualization of the abdominal aorta is limited due to bowel gas. Other findings: There is a right pleural effusion. IMPRESSION: Abnormal appearance of the liver worrisome for cirrhosis or other hepatocellular infiltrative process. Right pleural  effusion. Small amount of ascites. Splenomegaly. Hepatic protocol MRI is recommended. Gallstones without sonographic evidence of acute cholecystitis. Limited visualization of the pancreatic tail. Electronically Signed   By: Rhonda  Haley M.D.   On: 01/20/2017 07:52      Scheduled Meds: . escitalopram  20 mg Oral Daily  . feeding supplement (ENSURE ENLIVE)  237 mL Oral BID BM   Continuous Infusions:   LOS: 0 days     Ikran Patman,PA-s If 7PM-7AM, please contact night-coverage [email or web address] Password [email or web address] 01/20/2017, 9:05 AM

## 2017-01-20 NOTE — Consult Note (Addendum)
Consultation  Referring Provider: Maxie Barb MD     Primary Care Physician:  Lewis Moccasin, MD Primary Gastroenterologist: Stan Head, MD        Reason for Consultation: Anemia        Impression / Plan:   1. Normocytic Anemia - Mildly symptomatic with reports of fatigue. 1 unit has been transfused. Hg pre transfusion was 6.6. FOBT negative. Iron studies, folate, B12 normal. Haptoglobin normal. Etiology may be related to splenomegaly and cirrhosis. Considering EGD evaluation. *Post transfusion Hg is 7.9*   2. Abnormal LFTs - [place] suggests cirrhosis, likely alcoholic.  ALP 139, AST 129, ALT normal at 27. PT slightly prolonged at 17.5 and INR normal. Bilirubin elevated at 2.9 total. Acute Hep A, B, C serology negative. Sodium and potassium slightly low as well. Abdominal ultrasound shows uncomplicated gallstones, splenomegaly, and abnormal apperance of liver worrisome for cirrhosis or other hepatocellular infiltrative process. MRI recommended for further evaluation, but per patient preference, may consider doing this outpatient.   3. Diarrhea - Longstanding problem for patient, usually self limiting and associated with stress. Stool sample has already been collected for GI panel.    I have personally seen the patient, reviewed and repeated key elements of the history and physical and participated in formation of the assessment and plan the student has documented.   She appears to have cirrhosis based on clinical features and [place], alcoholism, and a normocytic iron that has features of anemia of chronic disease. Also w/ some diarrhea - not infectious.  She has good insight and says she will stop drinking.  DC home I will arrange outpatient EGD and colonoscopy Prn loperamide Thiamine, MVI, ferrous sulfate each day Stop EtOH - she plans to look into outpatient treatment Nutritional supplements  Iva Boop, MD, [facility] [phone] (pager) [phone]  after 5 PM, weekends and holidays  01/20/2017 5:39 PM   HPI:   Rhonda Haley is a 51 y.o. female with PMH of anxiety who presented to the ED yesterday with anemia. She reports that she had been experiencing "stress-induced" diarrhea - a problem she's had in the past - while dealing with some family problems the past few weeks. Usually this resolves on its own, but this time it persisted and she was feeling fatigued. She was having small volume diarrhea up to 10x / day, which was reportedly not bloody. Then one week ago, a routine Hgb check at [facility] showed she was anemic, and follow up check with PCP on 6/12 showed Hg of 7 and abnormal LFT's, so she was advised to go to the ED for a transfusion. She also made an appointment with [place] for liver evaluation.   She denies headaches, dizziness, nausea, vomiting, changes in vision, pruritis, abdominal pain, symptoms of reflux, dark tarry stools. She has noticed a decrease in appetite and "3-4 lbs" weight loss over the past 10 days or so. She denies heavy use of tylenol, but does report that she has been "a heavy drinker for a long time." She quantifies this as "about 2 glasses of wine per night" or sharing a bottle of wine with her husband. She says her last drink was about 10 days ago when she first saw the elevated LFT's with her PCP. She has noticed changes in her abdominal girth - and her husband reports first noticing her legs and arms becoming thinner while her abdomen becoming larger about 2 years ago. She is very concerned about her liver. She  has never had a colonoscopy.  Patient has a strong preference for managing these problems outpatient.    Past Medical History:  Diagnosis Date  . Anxiety   . Symptomatic anemia 01/19/2017    Past Surgical History:  Procedure Laterality Date  . FERTILITY SURGERY  2003   in vitro fertilization    Family History  Problem Relation Age of Onset  . Brain cancer Mother      Social History  Substance Use  Topics  . Smoking status: Current Every Day Smoker    Packs/day: 0.50    Years: 28.00    Types: Cigarettes  . Smokeless tobacco: Never Used  . Alcohol use Yes     Comment: 01/19/2017 "1 bottle of wine/week; quit 1 wk ago"    Prior to Admission medications   Medication Sig Start Date End Date Taking? Authorizing Provider  diphenhydramine-acetaminophen (TYLENOL PM) 25-500 MG TABS tablet Take 1-2 tablets by mouth at bedtime as needed (pain).    Yes [provider]  escitalopram (LEXAPRO) 20 MG tablet Take 20 mg by mouth daily. 01/18/17  Yes [provider]  loratadine (CLARITIN) 10 MG tablet Take 10 mg by mouth daily as needed for allergies.   Yes [provider]    Current Facility-Administered Medications  Medication Dose Route Frequency Provider Last Rate Last Dose  . escitalopram (LEXAPRO) tablet 20 mg  20 mg Oral Daily Danford, Earl Liteshristopher P, MD   20 mg at 01/20/17 1114  . feeding supplement (ENSURE ENLIVE) (ENSURE ENLIVE) liquid 237 mL  237 mL Oral BID BM Danford, Earl Liteshristopher P, MD   237 mL at 01/20/17 1114  . ondansetron (ZOFRAN) tablet 4 mg  4 mg Oral Q6H PRN Danford, Earl Liteshristopher P, MD       Or  . ondansetron (ZOFRAN) injection 4 mg  4 mg Intravenous Q6H PRN Alberteen Samanford, Christopher P, MD        Allergies as of 01/19/2017  . (No Known Allergies)     Review of Systems:    This is positive for those things mentioned in the HPI. All other review of systems are negative.       Physical Exam:  Vital signs in last 24 hours: Temp:  [98.3 F (36.8 C)-99.4 F (37.4 C)] 98.4 F (36.9 C) (06/15 1104) Pulse Rate:  [102-135] 102 (06/15 1104) Resp:  [17-27] 24 (06/15 1104) BP: (111-142)/(47-79) 123/65 (06/15 1104) SpO2:  [92 %-99 %] 99 % (06/15 1104) Weight:  [140 lb 14 oz (63.9 kg)-141 lb (64 kg)] 140 lb 14 oz (63.9 kg) (06/14 2031) Last BM Date: 01/20/17  General:  Somewhat sallow coloring though not overtly jaundiced. Extremities appear emaciated.    Eyes:  anicteric. ENT:   Mouth and posterior pharynx free of lesions.  Neck:   supple w/o thyromegaly or mass.  Lungs: Clear to auscultation bilaterally. Heart:   Systolic murmur noted. S1S2, no rubs,  gallops. Abdomen:  Distended, firm, but non-tender. Hepatosplenomegaly present. BS+  no hernia, or mass  Rectal: Not done. - heme negative stool prior Lymph:  no cervical or supraclavicular adenopathy. Extremities:   Arms and legs very thin. no edema Skin  Angioma on left cheek - multiple spider angiomata on trunk Neuro:  A&O x 3.  Psych:  A little anxious - appropriately concerned about her health.    Data Reviewed:   LAB RESULTS:  Recent Labs  01/19/17 1646 01/20/17 0458  WBC 8.7 8.6  HGB 7.0* 6.6*  HCT 23.4* 22.3*  PLT  262 245   BMET  Recent Labs  01/19/17 1646 01/20/17 0458  NA 131* 135  K 3.4* 3.8  CL 101 102  CO2 21* 24  GLUCOSE 137* 99  BUN <5* <5*  CREATININE 0.61 0.53  CALCIUM 8.4* 8.2*   LFT  Recent Labs  01/19/17 2010 01/20/17 0458  PROT  --  6.9  ALBUMIN  --  2.5*  AST  --  117*  ALT  --  27  ALKPHOS  --  127*  BILITOT 2.9* 2.8*  BILIDIR 1.3*  --   IBILI 1.6*  --    PT/INR  Recent Labs  01/19/17 2010  LABPROT 17.5*  INR 1.42    STUDIES: US Abdomen Complete  Result Date: 01/20/2017 CLINICAL DATA:  Elevated liver function studies, anemia. EXAM: ABDOMEN ULTRASOUND COMPLETE COMPARISON:  None in PACs FINDINGS: Gallbladder: The gallbladder is adequately distended. There are echogenic mobile shadowing stones measuring up to 1 cm in diameter. The gallbladder wall is top normal in thickness at 3 mm. There is no positive sonographic Murphy's sign. There is a trace of fluid adjacent to the gallbladder. Common bile duct: Diameter: 4.6 mm Liver: The hepatic echotexture is heterogeneous. The surface contour is irregular. There is no discrete mass or ductal dilation. There is ascites surrounding the liver. IVC: No abnormality visualized. Pancreas:  Visualization of the pancreatic tail is limited due to bowel gas. Spleen: The spleen is mildly enlarged measuring 12.6 x 11.2 x 5.7 cm. The calculated volume is 420 cc. Right Kidney: Length: 11.7 cm. Echogenicity within normal limits. No mass or hydronephrosis visualized. Left Kidney: Length: 11.4 cm. Echogenicity within normal limits. No mass or hydronephrosis visualized. Abdominal aorta: Visualization of the abdominal aorta is limited due to bowel gas. Other findings: There is a right pleural effusion. IMPRESSION: Abnormal appearance of the liver worrisome for cirrhosis or other hepatocellular infiltrative process. Right pleural effusion. Small amount of ascites. Splenomegaly. Hepatic protocol MRI is recommended. Gallstones without sonographic evidence of acute cholecystitis. Limited visualization of the pancreatic tail. Electronically Signed   By: David  [place] M.D.   On: 01/20/2017 07:52     PREVIOUS ENDOSCOPIES:            None   Thanks  Colin Mulders Spahn, PA-S   LOS: 0 days   @Carl  Sena Slate, MD, [facility] @  01/20/2017, 12:57 PM

## 2017-01-20 NOTE — H&P (Signed)
History and Physical  Patient Name: Rhonda Haley     BJY:[id number]    DOB: 08-14-1965    DOA: 01/19/2017 PCP: Lewis Moccasin, MD  Patient coming from: PCP's office  Chief Complaint: Abnormal labs      HPI: Rhonda Haley is a 51 y.o. female with a past medical history significant for anxiety who presents with anemia.  The patient was in her usual state of health until a few months ago when she started to have nonbloody nonmelanotic diarrhea and just generalized malaise/fatigue, insomnia, and anxiety.  Then a week or so ago, she had a routine Hgb check at her OB-Gyn's office as part of her routine pap smear visit, and they noted that she was anemic, and referred her to her PCP.  At her PCP's office two days ago, she had routine labs done, and today was called that her hemoglobin was 7 g/dL and she should go to the ER.  She reports no melena, hematochezia. No abnormal vaginal bleeding (menopause 2-3 years ago).  No epistaxis, hemoptysis or hematuria.  No prior anemia.  ED course: -Afebrile, heart rate 135, respirations and pulse is normal, blood pressure 136/76 -Na 131, K 3.4, Cr 0.61 (baseline unknown), WBC 8.7K, Hgb 7.0 and normocytic, elevated RDW -AST 129, ALT 29, total bilirubin 2.8 -Troponin negative -ECG showed sinus tachycardia -TRH were asked to evaluate for anemia and elevated LFTs     Other than fatigue, insomnia, diarrhea, and occasional hot flashes as mentioned above, the patient has no other new complaints, but notes a generalized malaise over subacute time frame.  No previous history of liver disease.  She reports that she quit drinking 1 week ago, prior to that drank 2 glasses of wine per day.  However, per PCP notes, that she brings with her, patient endorsed "heavy drinking" in past to PCP.     ROS: Review of Systems  Constitutional: Positive for malaise/fatigue. Negative for chills and fever.  HENT: Negative for nosebleeds.   Respiratory: Negative for hemoptysis.     Gastrointestinal: Negative for blood in stool and melena.  Genitourinary: Negative for hematuria.  Neurological: Positive for weakness.  All other systems reviewed and are negative.         Past Medical History:  Diagnosis Date  . Anxiety   . Symptomatic anemia 01/19/2017    Past Surgical History:  Procedure Laterality Date  . FERTILITY SURGERY  2003   in vitro fertilization    Social History: Patient lives with her husband.  The patient walks unassisted.  Smoker. She is a Transport planner for [employer].  She is from [place].  No Known Allergies  Family history: family history includes Brain cancer in her mother.  Prior to Admission medications   Medication Sig Start Date End Date Taking? Authorizing Provider  diphenhydramine-acetaminophen (TYLENOL PM) 25-500 MG TABS tablet Take 1-2 tablets by mouth at bedtime as needed (pain).    Yes [provider]  escitalopram (LEXAPRO) 20 MG tablet Take 20 mg by mouth daily. 01/18/17  Yes [provider]  loratadine (CLARITIN) 10 MG tablet Take 10 mg by mouth daily as needed for allergies.   Yes [provider]       Physical Exam: BP 120/68 (BP Location: Right Arm)   Pulse (!) 123   Temp 99.4 F (37.4 C) (Oral)   Resp 18   Ht 5\' 8"  (1.727 m)   Wt 63.9 kg (140 lb 14 oz)   SpO2 95%   BMI 21.42  kg/m  General appearance: Thin adult female, alert and in mild distress from malaise.   Eyes: Icteric, conjunctiva pink, lids and lashes normal. PERRL.    ENT: No nasal deformity, discharge, epistaxis.  Hearing normal. OP moist without lesions.   Neck: No neck masses.  Trachea midline.  No thyromegaly/tenderness. Lymph: No cervical or supraclavicular lymphadenopathy. Skin: Warm and dry.  No jaundice.  Spider angiomas everywhere. Cardiac: Tachycardic, regular, nl S1-S2, no murmurs appreciated.  Capillary refill is brisk.  JVP normal.  No LE edema.  Radial and DP pulses 2+ and symmetric. Respiratory: Normal  respiratory rate and rhythm.  CTAB without rales or wheezes. Abdomen: Abdomen soft.  No TTP. No tense or mobile ascites, but abdominal distension.  Suspect hepatomegaly.   MSK: No deformities or effusions.  No cyanosis or clubbing.  Diffuse loss of periphearl muscle mass. Neuro: Cranial nerves normal.  Sensation intact to light touch. Speech is fluent.  Muscle strength normal.    Psych: Sensorium intact and responding to questions, attention normal.  Behavior appropriate.  Affect anxious.  Judgment and insight appear normal.     Labs on Admission:  I have personally reviewed following labs and imaging studies: CBC:  Recent Labs Lab 01/19/17 1646  WBC 8.7  HGB 7.0*  HCT 23.4*  MCV 89.0  PLT 262   Basic Metabolic Panel:  Recent Labs Lab 01/19/17 1646  NA 131*  K 3.4*  CL 101  CO2 21*  GLUCOSE 137*  BUN <5*  CREATININE 0.61  CALCIUM 8.4*   GFR: Estimated Creatinine Clearance: 82 mL/min (by C-G formula based on SCr of 0.61 mg/dL).  Liver Function Tests:  Recent Labs Lab 01/19/17 1646 01/19/17 2010  AST 129*  --   ALT 29  --   ALKPHOS 139*  --   BILITOT 2.8* 2.9*  PROT 7.3  --   ALBUMIN 2.8*  --    Coagulation Profile:  Recent Labs Lab 01/19/17 2010  INR 1.42   Thyroid Function Tests:  Recent Labs  01/19/17 2010  TSH 2.591   Anemia Panel:  Recent Labs  01/19/17 2010  VITAMINB12 806  FOLATE 8.7  FERRITIN 42  TIBC 245*  IRON 36  RETICCTPCT 1.7        EKG: Independently reviewed. Rate 127, sinus tachycardia.        Assessment/Plan Principal Problem:   Symptomatic anemia Active Problems:   Hyponatremia   Hypokalemia   Elevated LFTs  1. Symptomatic anemia:  Unclear cause, hypoproliferative vs chronic blood loss.  Nothing in history to suggest acute blood loss.   -Check reticulocytes -Check iron stores -Check B12, folate -Check LDH, haptoglobin  -Repeat FOBT -Discuss endoscopy with GI as inpatient vs outpatient  2.  Hepatitis:  Seems more concerning finding.  Stigmata of chronic liver disease, jaundice, mildly elevated Tbili, with history of alcoholism -Check hepatitis serologies -Check ferritin -Check INR, albumin -RUQ US ordered -Consult to GI, appreciate cares  3. Hyponatremia:  -Check urine sodium -Check free water clearance -IVF gently overnight and repeat Na in AM  4. Hypokalemia:  Mild  5. Other medications:  -Continue escitalopram for anxiety       DVT prophylaxis: SCDs  Code Status: FULL  Family Communication: Husband at bedside  Disposition Plan: Anticipate transfuse one unit, begin anemia workup.  RUQ US and consult to GI for suspected cirrhosis. Consults called: None overnight Admission status: OBS At the point of initial evaluation, it is my clinical opinion that admission for OBSERVATION is reasonable  and necessary because the patient's presenting complaints in the context of their chronic conditions represent sufficient risk of deterioration or significant morbidity to constitute reasonable grounds for close observation in the hospital setting, but that the patient may be medically stable for discharge from the hospital within 24 to 48 hours.    Medical decision making: Patient seen at 7:20 PM on 01/19/2017.  The patient was discussed with Dr. Lester Kinsman.  What exists of the patient's chart was reviewed in depth and summarized above.  Clinical condition: stable.        Alberteen Sam [facility] Pager [phone]

## 2017-01-20 NOTE — Discharge Summary (Signed)
PATIENT DETAILS Name: Rhonda Haley Age: 51 y.o. Sex: female Date of Birth: May 06, 1966 MRN: [id number]. Admitting Physician: Alberteen Sam, MD WUJ:[id number], Christell Constant, MD  Admit Date: 01/19/2017 Discharge date: 01/20/2017  Recommendations for Outpatient Follow-up:  1. Follow up with PCP in 1-2 weeks 2. Please obtain BMP/CBC in one week 3. Please ensure follow up gastroenterology-for further workup of anemia and possible liver cirrhosis.  Admitted From:  Home  Disposition: Home   Home Health: No  Equipment/Devices: None  Discharge Condition: Stable  CODE STATUS: FULL CODE  Diet recommendation:  Regular ns  Brief Summary: See H&P, Labs, Consult and Test reports for all details in brief,51 year old admitted with several weeks history of fatigue, and intermittent diarrhea for the past few months. Found to have low hemoglobin by PCP, and referred to the ED for further evaluation and treatment.  Brief Hospital Course: Anemia: No overt blood loss-not sure if she had a recent GI bleeding in the context of possible liver cirrhosis/variceal bleeding. Suspect needs endoscopic evaluation at some point-but patient prefers to do this in the outpatient setting. If no obvious etiology found, probably will require hematology evaluation for bone marrow aspiration. Patient was admitted and transfused 1 unit of PRBC, she feels much better, and we will pursue further workup in the outpatient setting. Was evaluated by gastroenterology-they will arrange for outpatient follow-up and further workup.   Diarrhea: Ongoing for the past 2 months.GI pathogen panel was negative, further workup to be done in the outpatient setting. Patient has to keep herself well hydrated. Okay to use as needed Imodium.   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 Ultrasound showed hepatomegaly with possible changes consistent with cirrhosis. As noted above-further  evaluation to be done in the outpatient setting.  Alcohol abuse: He acknowledges being a heavy drinking a few years back-currently claims to drink only 3-4 glasses of wine on a weekly basis. The patient is very keen on quitting alcohol-claims last drink was a week back.  Procedures/Studies: None  Discharge Diagnoses:  Principal Problem:   Symptomatic anemia Active Problems:   Hyponatremia   Hypokalemia   Elevated LFTs   Diarrhea  Discharge Instructions:  Activity:  As tolerated    Discharge Instructions    Call MD for:  difficulty breathing, headache or visual disturbances    Complete by:  As directed    Diet - low sodium heart healthy    Complete by:  As directed    Discharge instructions    Complete by:  As directed    Follow with Primary MD  Lewis Moccasin, MD in 1 week for a repeat complete blood panel.  Please follow up with gastroenterology as scheduled.  Please get a complete blood count and chemistry panel checked by your Primary MD at your next visit, and again as instructed by your Primary MD.  Get Medicines reviewed and adjusted: Please take all your medications with you for your next visit with your Primary MD  Laboratory/radiological data: Please request your Primary MD to go over all hospital tests and procedure/radiological results at the follow up, please ask your Primary MD to get all Hospital records sent to his/her office.  In some cases, they will be blood work, cultures and biopsy results pending at the time of your discharge. Please request that your primary care M.D. follows up on these results.  Also Note the following: If you experience worsening of your admission symptoms, develop shortness of breath, life threatening emergency,  suicidal or homicidal thoughts you must seek medical attention immediately by calling 911 or calling your MD immediately  if symptoms less severe.  You must read complete instructions/literature along with all the  possible adverse reactions/side effects for all the Medicines you take and that have been prescribed to you. Take any new Medicines after you have completely understood and accpet all the possible adverse reactions/side effects.   Do not drive when taking Pain medications or sleeping medications (Benzodaizepines)  Do not take more than prescribed Pain, Sleep and Anxiety Medications. It is not advisable to combine anxiety,sleep and pain medications without talking with your primary care practitioner  Special Instructions: If you have smoked or chewed Tobacco  in the last 2 yrs please stop smoking, stop any regular Alcohol  and or any Recreational drug use.  Wear Seat belts while driving.  Please note: You were cared for by a hospitalist during your hospital stay. Once you are discharged, your primary care physician will handle any further medical issues. Please note that NO REFILLS for any discharge medications will be authorized once you are discharged, as it is imperative that you return to your primary care physician (or establish a relationship with a primary care physician if you do not have one) for your post hospital discharge needs so that they can reassess your need for medications and monitor your lab values.   Increase activity slowly    Complete by:  As directed      Allergies as of 01/20/2017   No Known Allergies     Medication List    TAKE these medications   diphenhydramine-acetaminophen 25-500 MG Tabs tablet Commonly known as:  TYLENOL PM Take 1-2 tablets by mouth at bedtime as needed (pain).   escitalopram 20 MG tablet Commonly known as:  LEXAPRO Take 20 mg by mouth daily.   feeding supplement (ENSURE ENLIVE) Liqd Take 237 mLs by mouth 2 (two) times daily between meals. Start taking on:  01/21/2017   ferrous sulfate 325 (65 FE) MG tablet Take 1 tablet (325 mg total) by mouth 2 (two) times daily with a meal.   loperamide 2 MG tablet Commonly known as:  IMODIUM  A-D Take 1 tablet (2 mg total) by mouth 3 (three) times daily as needed for diarrhea or loose stools.   loratadine 10 MG tablet Commonly known as:  CLARITIN Take 10 mg by mouth daily as needed for allergies.   thiamine 100 MG tablet Commonly known as:  VITAMIN B-1 Take 1 tablet (100 mg total) by mouth daily.      Follow-up Information    Lewis Moccasin, MD. Schedule an appointment as soon as possible for a visit in 1 week(s).   Specialty:  Family Medicine Contact information: [address] [place] [address] [phone]          No Known Allergies  Consultations:   GI  Other Procedures/Studies: US Abdomen Complete  Result Date: 01/20/2017 CLINICAL DATA:  Elevated liver function studies, anemia. EXAM: ABDOMEN ULTRASOUND COMPLETE COMPARISON:  None in PACs FINDINGS: Gallbladder: The gallbladder is adequately distended. There are echogenic mobile shadowing stones measuring up to 1 cm in diameter. The gallbladder wall is top normal in thickness at 3 mm. There is no positive sonographic Murphy's sign. There is a trace of fluid adjacent to the gallbladder. Common bile duct: Diameter: 4.6 mm Liver: The hepatic echotexture is heterogeneous. The surface contour is irregular. There is no discrete mass or ductal dilation. There  is ascites surrounding the liver. IVC: No abnormality visualized. Pancreas: Visualization of the pancreatic tail is limited due to bowel gas. Spleen: The spleen is mildly enlarged measuring 12.6 x 11.2 x 5.7 cm. The calculated volume is 420 cc. Right Kidney: Length: 11.7 cm. Echogenicity within normal limits. No mass or hydronephrosis visualized. Left Kidney: Length: 11.4 cm. Echogenicity within normal limits. No mass or hydronephrosis visualized. Abdominal aorta: Visualization of the abdominal aorta is limited due to bowel gas. Other findings: There is a right pleural effusion. IMPRESSION: Abnormal appearance of the liver worrisome for cirrhosis or other  hepatocellular infiltrative process. Right pleural effusion. Small amount of ascites. Splenomegaly. Hepatic protocol MRI is recommended. Gallstones without sonographic evidence of acute cholecystitis. Limited visualization of the pancreatic tail. Electronically Signed   By: David  [place] M.D.   On: 01/20/2017 07:52     TODAY-DAY OF DISCHARGE:  Subjective:   Rhonda Haley today has no headache,no chest abdominal pain,no new weakness tingling or numbness, feels much better wants to go home today.   Objective:   Blood pressure 118/69, pulse (!) 106, temperature 98.5 F (36.9 C), temperature source Oral, resp. rate (!) 24, height 5\' 8"  (1.727 m), weight 63.9 kg (140 lb 14 oz), SpO2 99 %.  Intake/Output Summary (Last 24 hours) at 01/20/17 1743 Last data filed at 01/20/17 1104  Gross per 24 hour  Intake             1353 ml  Output                0 ml  Net             1353 ml   Filed Weights   01/19/17 1635 01/19/17 2031  Weight: 64 kg (141 lb) 63.9 kg (140 lb 14 oz)    Exam: Awake Alert, Oriented *3, No new F.N deficits, Normal affect [place].AT,PERRAL Supple Neck,No JVD, No cervical lymphadenopathy appriciated.  Symmetrical Chest wall movement, Good air movement bilaterally, CTAB RRR,No Gallops,Rubs or new Murmurs, No Parasternal Heave +ve B.Sounds, Abd Soft, Non tender, No organomegaly appriciated, No rebound -guarding or rigidity. No Cyanosis, Clubbing or edema, No new Rash or bruise   PERTINENT RADIOLOGIC STUDIES: US Abdomen Complete  Result Date: 01/20/2017 CLINICAL DATA:  Elevated liver function studies, anemia. EXAM: ABDOMEN ULTRASOUND COMPLETE COMPARISON:  None in PACs FINDINGS: Gallbladder: The gallbladder is adequately distended. There are echogenic mobile shadowing stones measuring up to 1 cm in diameter. The gallbladder wall is top normal in thickness at 3 mm. There is no positive sonographic Murphy's sign. There is a trace of fluid adjacent to the gallbladder. Common bile duct:  Diameter: 4.6 mm Liver: The hepatic echotexture is heterogeneous. The surface contour is irregular. There is no discrete mass or ductal dilation. There is ascites surrounding the liver. IVC: No abnormality visualized. Pancreas: Visualization of the pancreatic tail is limited due to bowel gas. Spleen: The spleen is mildly enlarged measuring 12.6 x 11.2 x 5.7 cm. The calculated volume is 420 cc. Right Kidney: Length: 11.7 cm. Echogenicity within normal limits. No mass or hydronephrosis visualized. Left Kidney: Length: 11.4 cm. Echogenicity within normal limits. No mass or hydronephrosis visualized. Abdominal aorta: Visualization of the abdominal aorta is limited due to bowel gas. Other findings: There is a right pleural effusion. IMPRESSION: Abnormal appearance of the liver worrisome for cirrhosis or other hepatocellular infiltrative process. Right pleural effusion. Small amount of ascites. Splenomegaly. Hepatic protocol MRI is recommended. Gallstones without sonographic evidence of acute cholecystitis. Limited  visualization of the pancreatic tail. Electronically Signed   By: David  [place] M.D.   On: 01/20/2017 07:52     PERTINENT LAB RESULTS: CBC:  Recent Labs  01/20/17 0458 01/20/17 1536  WBC 8.6 8.9  HGB 6.6* 7.9*  HCT 22.3* 25.3*  PLT 245 237   CMET CMP     Component Value Date/Time   NA 135 01/20/2017 0458   K 3.8 01/20/2017 0458   CL 102 01/20/2017 0458   CO2 24 01/20/2017 0458   GLUCOSE 99 01/20/2017 0458   BUN <5 (L) 01/20/2017 0458   CREATININE 0.53 01/20/2017 0458   CALCIUM 8.2 (L) 01/20/2017 0458   PROT 6.9 01/20/2017 0458   ALBUMIN 2.5 (L) 01/20/2017 0458   AST 117 (H) 01/20/2017 0458   ALT 27 01/20/2017 0458   ALKPHOS 127 (H) 01/20/2017 0458   BILITOT 2.8 (H) 01/20/2017 0458   GFRNONAA >60 01/20/2017 0458   GFRAA >60 01/20/2017 0458    GFR Estimated Creatinine Clearance: 82 mL/min (by C-G formula based on SCr of 0.53 mg/dL). No results for input(s): LIPASE, AMYLASE  in the last 72 hours. No results for input(s): CKTOTAL, CKMB, CKMBINDEX, TROPONINI in the last 72 hours. Invalid input(s): POCBNP No results for input(s): DDIMER in the last 72 hours. No results for input(s): HGBA1C in the last 72 hours. No results for input(s): CHOL, HDL, LDLCALC, TRIG, CHOLHDL, LDLDIRECT in the last 72 hours.  Recent Labs  01/19/17 2010  TSH 2.591    Recent Labs  01/19/17 2010  VITAMINB12 806  FOLATE 8.7  FERRITIN 42  TIBC 245*  IRON 36  RETICCTPCT 1.7   Coags:  Recent Labs  01/19/17 2010  INR 1.42   Microbiology: Recent Results (from the past 240 hour(s))  Gastrointestinal Panel by PCR , Stool     Status: None   Collection Time: 01/20/17  8:56 AM  Result Value Ref Range Status   Campylobacter species NOT DETECTED NOT DETECTED Final   Plesimonas shigelloides NOT DETECTED NOT DETECTED Final   Salmonella species NOT DETECTED NOT DETECTED Final   Yersinia enterocolitica NOT DETECTED NOT DETECTED Final   Vibrio species NOT DETECTED NOT DETECTED Final   Vibrio cholerae NOT DETECTED NOT DETECTED Final   Enteroaggregative E coli (EAEC) NOT DETECTED NOT DETECTED Final   Enteropathogenic E coli (EPEC) NOT DETECTED NOT DETECTED Final   Enterotoxigenic E coli (ETEC) NOT DETECTED NOT DETECTED Final   Shiga like toxin producing E coli (STEC) NOT DETECTED NOT DETECTED Final   Shigella/Enteroinvasive E coli (EIEC) NOT DETECTED NOT DETECTED Final   Cryptosporidium NOT DETECTED NOT DETECTED Final   Cyclospora cayetanensis NOT DETECTED NOT DETECTED Final   Entamoeba histolytica NOT DETECTED NOT DETECTED Final   Giardia lamblia NOT DETECTED NOT DETECTED Final   Adenovirus F40/41 NOT DETECTED NOT DETECTED Final   Astrovirus NOT DETECTED NOT DETECTED Final   Norovirus GI/GII NOT DETECTED NOT DETECTED Final   Rotavirus A NOT DETECTED NOT DETECTED Final   Sapovirus (I, II, IV, and V) NOT DETECTED NOT DETECTED Final    FURTHER DISCHARGE INSTRUCTIONS:  Get  Medicines reviewed and adjusted: Please take all your medications with you for your next visit with your Primary MD  Laboratory/radiological data: Please request your Primary MD to go over all hospital tests and procedure/radiological results at the follow up, please ask your Primary MD to get all Hospital records sent to his/her office.  In some cases, they will be blood work, cultures and biopsy results  pending at the time of your discharge. Please request that your primary care M.D. goes through all the records of your hospital data and follows up on these results.  Also Note the following: If you experience worsening of your admission symptoms, develop shortness of breath, life threatening emergency, suicidal or homicidal thoughts you must seek medical attention immediately by calling 911 or calling your MD immediately  if symptoms less severe.  You must read complete instructions/literature along with all the possible adverse reactions/side effects for all the Medicines you take and that have been prescribed to you. Take any new Medicines after you have completely understood and accpet all the possible adverse reactions/side effects.   Do not drive when taking Pain medications or sleeping medications (Benzodaizepines)  Do not take more than prescribed Pain, Sleep and Anxiety Medications. It is not advisable to combine anxiety,sleep and pain medications without talking with your primary care practitioner  Special Instructions: If you have smoked or chewed Tobacco  in the last 2 yrs please stop smoking, stop any regular Alcohol  and or any Recreational drug use.  Wear Seat belts while driving.  Please note: You were cared for by a hospitalist during your hospital stay. Once you are discharged, your primary care physician will handle any further medical issues. Please note that NO REFILLS for any discharge medications will be authorized once you are discharged, as it is imperative that you  return to your primary care physician (or establish a relationship with a primary care physician if you do not have one) for your post hospital discharge needs so that they can reassess your need for medications and monitor your lab values.  Total Time spent coordinating discharge including counseling, education and face to face time equals 35 minutes.  SignedJeoffrey Massed 01/20/2017 5:43 PM

## 2017-01-20 NOTE — Progress Notes (Signed)
Initial Nutrition Assessment  DOCUMENTATION CODES:   Severe malnutrition in context of acute illness/injury  INTERVENTION:  Continue Ensure Enlive po BID, each supplement provides 350 kcal and 20 grams of protein  Encourage adequate PO intake.   NUTRITION DIAGNOSIS:   Malnutrition (severe) related to altered GI function as evidenced by energy intake < or equal to 50% for > or equal to 5 days, severe depletion of body fat.  GOAL:   Patient will meet greater than or equal to 90% of their needs  MONITOR:   PO intake, Supplement acceptance, Labs, Weight trends, Skin, I & O's  REASON FOR ASSESSMENT:   Malnutrition Screening Tool    ASSESSMENT:   51 y.o. female with a past medical history significant for anxiety who presents with anemia.  Pt reports her appetite has been improving. Meal completion 60% at lunch today. Pt reports she has been experiencing diarrhea over the past week, thus has only been consuming bites out of food throughout the day. She reports she has only been consuming bland type foods. Pt reports having a 3-4 lb loss over the past 2 months (not significant for time frame). Pt currently has Ensure ordered and has been consuming them. Pt educated to continue with nutritional supplements at home especially if po intake is poor.   Nutrition-Focused physical exam completed. Findings are severe fat depletion, no muscle depletion, and no edema.   Labs and medications reviewed.   Diet Order:  Diet regular Room service appropriate? Yes; Fluid consistency: Thin  Skin:  Reviewed, no issues  Last BM:  6/15  Height:   Ht Readings from Last 1 Encounters:  01/19/17 5\' 8"  (1.727 m)    Weight:   Wt Readings from Last 1 Encounters:  01/19/17 140 lb 14 oz (63.9 kg)    Ideal Body Weight:  63.6 kg  BMI:  Body mass index is 21.42 kg/m.  Estimated Nutritional Needs:   Kcal:  1700-1900  Protein:  75-85 grams  Fluid:  1.7 - 1.9 L/day  EDUCATION NEEDS:    Education needs addressed  Roslyn SmilingStephanie Quinnton Bury, MS, RD, LDN Pager # [phone] After hours/ weekend pager # [phone]

## 2017-01-20 NOTE — Progress Notes (Addendum)
Patient has had >4 loose watery stool. Per protocol, Patient placed on enteric precautions. On call notified about loose stool and if they would like a C-diff PCR to be done. Awaiting response.    Patient with low hgb this am, 6.6. MD notified.

## 2017-01-21 LAB — TYPE AND SCREEN
ABO/RH(D): A POS
ANTIBODY SCREEN: NEGATIVE
UNIT DIVISION: 0

## 2017-01-21 LAB — BPAM RBC
Blood Product Expiration Date: 201806212359
ISSUE DATE / TIME: 201806150903
Unit Type and Rh: 9500

## 2017-01-24 ENCOUNTER — Telehealth: Payer: Self-pay

## 2017-01-24 NOTE — Telephone Encounter (Signed)
Patient notified and she verbalized confirmation of the appts.

## 2017-01-24 NOTE — Telephone Encounter (Signed)
Schedule endo/colon per Dr. Leone PayorGessner.for anemia and diarrhea. Scheduled for 02/01/17 0730.   Left message for patient to call back to discuss and arrange pre-visit.

## 2017-01-25 ENCOUNTER — Ambulatory Visit: Payer: Self-pay | Admitting: Physician Assistant

## 2017-01-25 ENCOUNTER — Other Ambulatory Visit: Payer: Self-pay

## 2017-01-27 ENCOUNTER — Encounter: Payer: Self-pay | Admitting: Internal Medicine

## 2017-01-27 ENCOUNTER — Ambulatory Visit (AMBULATORY_SURGERY_CENTER): Payer: Self-pay

## 2017-01-27 VITALS — Ht 68.5 in | Wt 143.8 lb

## 2017-01-27 DIAGNOSIS — K909 Intestinal malabsorption, unspecified: Secondary | ICD-10-CM

## 2017-01-27 DIAGNOSIS — D649 Anemia, unspecified: Secondary | ICD-10-CM

## 2017-01-27 DIAGNOSIS — R197 Diarrhea, unspecified: Secondary | ICD-10-CM

## 2017-01-27 NOTE — Progress Notes (Signed)
No diet meds No home oxygen No past problems with anesthesia No allergies to eggs or soy  Declined emmi 

## 2017-02-01 ENCOUNTER — Ambulatory Visit (AMBULATORY_SURGERY_CENTER): Payer: 59 | Admitting: Internal Medicine

## 2017-02-01 ENCOUNTER — Encounter: Payer: Self-pay | Admitting: Internal Medicine

## 2017-02-01 VITALS — BP 114/69 | HR 96 | Temp 98.9°F | Resp 16 | Ht 68.0 in | Wt 143.0 lb

## 2017-02-01 DIAGNOSIS — K2100 Gastro-esophageal reflux disease with esophagitis, without bleeding: Secondary | ICD-10-CM

## 2017-02-01 DIAGNOSIS — R197 Diarrhea, unspecified: Secondary | ICD-10-CM

## 2017-02-01 DIAGNOSIS — D649 Anemia, unspecified: Secondary | ICD-10-CM | POA: Diagnosis present

## 2017-02-01 MED ORDER — SODIUM CHLORIDE 0.9 % IV SOLN: 500.0000 mL | INTRAVENOUS | Status: DC

## 2017-02-01 MED ORDER — PANTOPRAZOLE SODIUM 20 MG PO TBEC: 20.0000 mg | DELAYED_RELEASE_TABLET | Freq: Every day | ORAL | 0 refills | Status: AC

## 2017-02-01 NOTE — Progress Notes (Signed)
Dental advisory given to [place] and oriented x3, pleased with MAC, report to RN SarahAlert and oriented x3, pleased with MAC, report to RN

## 2017-02-01 NOTE — Op Note (Signed)
[facility] Patient Name: Rhonda Haley Procedure Date: 02/01/2017 7:32 AM MRN: [id number] Endoscopist: Iva Boop , MD Age: 51 Referring MD:  Date of Birth: Aug 19, 1965 Gender: Female Account #: [id number] Procedure:                Colonoscopy Indications:              Clinically significant diarrhea of unexplained                            origin Medicines:                Propofol per Anesthesia, Monitored Anesthesia Care Procedure:                Pre-Anesthesia Assessment:                           - Prior to the procedure, a History and Physical                            was performed, and patient medications and                            allergies were reviewed. The patient's tolerance of                            previous anesthesia was also reviewed. The risks                            and benefits of the procedure and the sedation                            options and risks were discussed with the patient.                            All questions were answered, and informed consent                            was obtained. Prior Anticoagulants: The patient has                            taken no previous anticoagulant or antiplatelet                            agents. ASA Grade Assessment: II - A patient with                            mild systemic disease. After reviewing the risks                            and benefits, the patient was deemed in                            satisfactory condition to undergo the procedure.  After obtaining informed consent, the colonoscope                            was passed under direct vision. Throughout the                            procedure, the patient's blood pressure, pulse, and                            oxygen saturations were monitored continuously. The                            Colonoscope was introduced through the anus and                            advanced to the the terminal ileum,  with                            identification of the appendiceal orifice and IC                            valve. The colonoscopy was performed without                            difficulty. The patient tolerated the procedure                            well. The quality of the bowel preparation was                            good. The bowel preparation used was Miralax. The                            terminal ileum, the appendiceal orifice and the                            rectum were photographed. Scope In: 7:50:22 AM Scope Out: 8:03:49 AM Scope Withdrawal Time: 0 hours 10 minutes 3 seconds  Total Procedure Duration: 0 hours 13 minutes 27 seconds  Findings:                 The perianal and digital rectal examinations were                            normal.                           Scattered small-mouthed diverticula were found in                            the entire colon.                           The terminal ileum appeared normal.  The exam was otherwise without abnormality on                            direct and retroflexion views.                           Biopsies for histology were taken with a cold                            forceps from the right colon and left colon for                            evaluation of microscopic colitis. Estimated blood                            loss was minimal. Complications:            No immediate complications. Estimated Blood Loss:     Estimated blood loss was minimal. Impression:               - Mild diverticulosis in the entire examined colon.                           - The examined portion of the ileum was normal.                           - The examination was otherwise normal on direct                            and retroflexion views.                           - Biopsies were taken with a cold forceps from the                            right colon and left colon for evaluation of                             microscopic colitis. Recommendation:           - Patient has a contact number available for                            emergencies. The signs and symptoms of potential                            delayed complications were discussed with the                            patient. Return to normal activities tomorrow.                            Written discharge instructions were provided to the  patient.                           - Resume previous diet.                           - Continue present medications.                           - Repeat colonoscopy is recommended. The                            colonoscopy date will be determined after pathology                            results from today's exam become available for                            review.                           - will review labs from PCP and determine next                            steps after that and pathology review. Diarrhea                            could be related to chronic alcohol consumption and                            associated disease - if so hould improve with                            continued abstinence. Iva Boop, MD 02/01/2017 8:21:32 AM This report has been signed electronically.

## 2017-02-01 NOTE — Progress Notes (Signed)
Called to room to assist during endoscopic procedure.  Patient ID and intended procedure confirmed with present staff. Received instructions for my participation in the procedure from the performing physician.  

## 2017-02-01 NOTE — Patient Instructions (Addendum)
The upper endoscopy exam showed some changes of acid reflux in the esophagus but was ok otherwise. I have prescribed pantoprazole for this and also want you to follow reflux precautions - see handout.  The colonoscopy showed mild diverticulosis - not a problem - but otherwise ok. I took biopsies to see if there was underlying inflammation only visible by microscope.  I will let you know biopsy results by phone and next steps.  Please continue to abstain from alcohol.  I appreciate the opportunity to care for you. Iva Booparl E. Cydne Grahn, MD, [facility]  New prescription sent to your pharmacy. Protonix is taken every morning 20-30 minutes prior to morning meal.  Handouts given for Diverticulosis and Esophagitis.  YOU HAD AN ENDOSCOPIC PROCEDURE TODAY AT THE [facility]:   Refer to the procedure report that was given to you for any specific questions about what was found during the examination.  If the procedure report does not answer your questions, please call your gastroenterologist to clarify.  If you requested that your care partner not be given the details of your procedure findings, then the procedure report has been included in a sealed envelope for you to review at your convenience later.  YOU SHOULD EXPECT: Some feelings of bloating in the abdomen. Passage of more gas than usual.  Walking can help get rid of the air that was put into your GI tract during the procedure and reduce the bloating. If you had a lower endoscopy (such as a colonoscopy or flexible sigmoidoscopy) you may notice spotting of blood in your stool or on the toilet paper. If you underwent a bowel prep for your procedure, you may not have a normal bowel movement for a few days.  Please Note:  You might notice some irritation and congestion in your nose or some drainage.  This is from the oxygen used during your procedure.  There is no need for concern and it should clear up in a day or so.  SYMPTOMS TO REPORT  IMMEDIATELY:   Following lower endoscopy (colonoscopy or flexible sigmoidoscopy):  Excessive amounts of blood in the stool  Significant tenderness or worsening of abdominal pains  Swelling of the abdomen that is new, acute  Fever of 100F or higher   Following upper endoscopy (EGD)  Vomiting of blood or coffee ground material  New chest pain or pain under the shoulder blades  Painful or persistently difficult swallowing  New shortness of breath  Fever of 100F or higher  Black, tarry-looking stools  For urgent or emergent issues, a gastroenterologist can be reached at any hour by calling [phone].   DIET:  We do recommend a small meal at first, but then you may proceed to your regular diet.  Drink plenty of fluids but you should avoid alcoholic beverages for 24 hours.  ACTIVITY:  You should plan to take it easy for the rest of today and you should NOT DRIVE or use heavy machinery until tomorrow (because of the sedation medicines used during the test).    FOLLOW UP: Our staff will call the number listed on your records the next business day following your procedure to check on you and address any questions or concerns that you may have regarding the information given to you following your procedure. If we do not reach you, we will leave a message.  However, if you are feeling well and you are not experiencing any problems, there is no need to return our call.  We  will assume that you have returned to your regular daily activities without incident.  If any biopsies were taken you will be contacted by phone or by letter within the next 1-3 weeks.  Please call us at [phone] if you have not heard about the biopsies in 3 weeks.    SIGNATURES/CONFIDENTIALITY: You and/or your care partner have signed paperwork which will be entered into your electronic medical record.  These signatures attest to the fact that that the information above on your After Visit Summary has been  reviewed and is understood.  Full responsibility of the confidentiality of this discharge information lies with you and/or your care-partner.

## 2017-02-01 NOTE — Op Note (Signed)
[facility] Patient Name: Rhonda Haley Procedure Date: 02/01/2017 7:32 AM MRN: [id number] Endoscopist: Iva Boop , MD Age: 51 Referring MD:  Date of Birth: 06/30/1966 Gender: Female Account #: [id number] Procedure:                Upper GI endoscopy Indications:              Anemia, alcoholic liver disease, diarrhea Medicines:                Propofol per Anesthesia, Monitored Anesthesia Care Procedure:                Pre-Anesthesia Assessment:                           - Prior to the procedure, a History and Physical                            was performed, and patient medications and                            allergies were reviewed. The patient's tolerance of                            previous anesthesia was also reviewed. The risks                            and benefits of the procedure and the sedation                            options and risks were discussed with the patient.                            All questions were answered, and informed consent                            was obtained. Prior Anticoagulants: The patient has                            taken no previous anticoagulant or antiplatelet                            agents. ASA Grade Assessment: II - A patient with                            mild systemic disease. After reviewing the risks                            and benefits, the patient was deemed in                            satisfactory condition to undergo the procedure.                           After obtaining informed consent, the endoscope was  passed under direct vision. Throughout the                            procedure, the patient's blood pressure, pulse, and                            oxygen saturations were monitored continuously. The                            Endoscope was introduced through the mouth, and                            advanced to the second part of duodenum. The upper          GI endoscopy was accomplished without difficulty.                            The patient tolerated the procedure well. Scope In: Scope Out: Findings:                 LA Grade A (one or more mucosal breaks less than 5                            mm, not extending between tops of 2 mucosal folds)                            esophagitis with no bleeding was found in the                            distal esophagus.                           The exam was otherwise without abnormality.                           The cardia and gastric fundus were normal on                            retroflexion. Complications:            No immediate complications. Estimated Blood Loss:     Estimated blood loss: none. Impression:               - LA Grade A reflux esophagitis.                           - The examination was otherwise normal.                           - No specimens collected. Recommendation:           - Patient has a contact number available for                            emergencies. The signs and symptoms of potential  delayed complications were discussed with the                            patient. Return to normal activities tomorrow.                            Written discharge instructions were provided to the                            patient.                           - Resume previous diet.                           - Continue present medications.                           - Follow an antireflux regimen.                           - Use Protonix (pantoprazole) 20 mg PO daily for 3                            months. Iva Boop, MD 02/01/2017 8:17:38 AM This report has been signed electronically.

## 2017-02-01 NOTE — Progress Notes (Signed)
No changes in medical or surgical hx since PV 

## 2017-02-02 ENCOUNTER — Telehealth: Payer: Self-pay | Admitting: *Deleted

## 2017-02-02 NOTE — Telephone Encounter (Signed)
  Follow up Call-  Call back number 02/01/2017  Post procedure Call Back phone  # [phone]  Permission to leave phone message Yes  Some recent data might be hidden     Patient questions:  Do you have a fever, pain , or abdominal swelling? No. Pain Score  0 *  Have you tolerated food without any problems? Yes.    Have you been able to return to your normal activities? Yes.    Do you have any questions about your discharge instructions: Diet   No. Medications  No. Follow up visit  No.  Do you have questions or concerns about your Care? No.  Actions: * If pain score is 4 or above: No action needed, pain <4.

## 2017-02-03 ENCOUNTER — Other Ambulatory Visit: Payer: Self-pay | Admitting: Family Medicine

## 2017-02-03 DIAGNOSIS — R945 Abnormal results of liver function studies: Secondary | ICD-10-CM

## 2017-02-03 DIAGNOSIS — K7460 Unspecified cirrhosis of liver: Secondary | ICD-10-CM

## 2017-02-09 ENCOUNTER — Encounter: Payer: Self-pay | Admitting: Internal Medicine

## 2017-02-09 ENCOUNTER — Other Ambulatory Visit: Payer: Self-pay

## 2017-02-09 DIAGNOSIS — K709 Alcoholic liver disease, unspecified: Secondary | ICD-10-CM

## 2017-02-09 NOTE — Progress Notes (Signed)
Call from office: 1) Colon bxs normal - no inflammation - diarrhea most likely was from EtOH and low protein in body 2) Keep up good work - stay off EtOH - eat well - I think she can heal and hopefully normalize 3) Labs 7/30 CBC, CMET, Hep B surface Ab, Hep A Ab total  Dx: alcoholic liver disease 4) Appt next avilable  [facility] colon recall 10 yrs no letter be sure to cc PCP on this info

## 2017-02-21 ENCOUNTER — Emergency Department (HOSPITAL_COMMUNITY): Payer: 59

## 2017-02-21 ENCOUNTER — Inpatient Hospital Stay (HOSPITAL_COMMUNITY): Payer: 59

## 2017-02-21 ENCOUNTER — Encounter (HOSPITAL_COMMUNITY): Payer: Self-pay

## 2017-02-21 ENCOUNTER — Inpatient Hospital Stay (HOSPITAL_COMMUNITY)
Admission: EM | Admit: 2017-02-21 | Discharge: 2017-02-24 | DRG: 188 | Disposition: A | Discharge status: Home / Self Care | Payer: 59 | Attending: Internal Medicine | Admitting: Internal Medicine

## 2017-02-21 DIAGNOSIS — K219 Gastro-esophageal reflux disease without esophagitis: Secondary | ICD-10-CM | POA: Diagnosis present

## 2017-02-21 DIAGNOSIS — J948 Other specified pleural conditions: Secondary | ICD-10-CM | POA: Diagnosis present

## 2017-02-21 DIAGNOSIS — K7030 Alcoholic cirrhosis of liver without ascites: Secondary | ICD-10-CM | POA: Diagnosis present

## 2017-02-21 DIAGNOSIS — R161 Splenomegaly, not elsewhere classified: Secondary | ICD-10-CM | POA: Diagnosis present

## 2017-02-21 DIAGNOSIS — Z808 Family history of malignant neoplasm of other organs or systems: Secondary | ICD-10-CM

## 2017-02-21 DIAGNOSIS — R17 Unspecified jaundice: Secondary | ICD-10-CM | POA: Diagnosis not present

## 2017-02-21 DIAGNOSIS — K7460 Unspecified cirrhosis of liver: Secondary | ICD-10-CM

## 2017-02-21 DIAGNOSIS — K7031 Alcoholic cirrhosis of liver with ascites: Secondary | ICD-10-CM | POA: Diagnosis present

## 2017-02-21 DIAGNOSIS — D649 Anemia, unspecified: Secondary | ICD-10-CM | POA: Diagnosis present

## 2017-02-21 DIAGNOSIS — R0600 Dyspnea, unspecified: Secondary | ICD-10-CM | POA: Diagnosis not present

## 2017-02-21 DIAGNOSIS — R000 Tachycardia, unspecified: Secondary | ICD-10-CM | POA: Diagnosis present

## 2017-02-21 DIAGNOSIS — E43 Unspecified severe protein-calorie malnutrition: Secondary | ICD-10-CM | POA: Diagnosis present

## 2017-02-21 DIAGNOSIS — R0602 Shortness of breath: Secondary | ICD-10-CM | POA: Diagnosis present

## 2017-02-21 DIAGNOSIS — E876 Hypokalemia: Secondary | ICD-10-CM | POA: Diagnosis present

## 2017-02-21 DIAGNOSIS — K8020 Calculus of gallbladder without cholecystitis without obstruction: Secondary | ICD-10-CM | POA: Diagnosis present

## 2017-02-21 DIAGNOSIS — F419 Anxiety disorder, unspecified: Secondary | ICD-10-CM | POA: Diagnosis present

## 2017-02-21 DIAGNOSIS — R932 Abnormal findings on diagnostic imaging of liver and biliary tract: Secondary | ICD-10-CM | POA: Diagnosis not present

## 2017-02-21 DIAGNOSIS — R188 Other ascites: Secondary | ICD-10-CM | POA: Diagnosis not present

## 2017-02-21 DIAGNOSIS — F17210 Nicotine dependence, cigarettes, uncomplicated: Secondary | ICD-10-CM | POA: Diagnosis present

## 2017-02-21 DIAGNOSIS — J90 Pleural effusion, not elsewhere classified: Secondary | ICD-10-CM

## 2017-02-21 HISTORY — PX: IR THORACENTESIS ASP PLEURAL SPACE W/IMG GUIDE: IMG5380

## 2017-02-21 LAB — BODY FLUID CELL COUNT WITH DIFFERENTIAL
Lymphs, Fluid: 26 %
Monocyte-Macrophage-Serous Fluid: 69 % (ref 50–90)
Neutrophil Count, Fluid: 5 % (ref 0–25)
WBC FLUID: 88 uL (ref 0–1000)

## 2017-02-21 LAB — CBC WITH DIFFERENTIAL/PLATELET
Basophils Absolute: 0 10*3/uL (ref 0.0–0.1)
Basophils Relative: 0 %
EOS PCT: 0 %
Eosinophils Absolute: 0 10*3/uL (ref 0.0–0.7)
HEMATOCRIT: 28.7 % — AB (ref 36.0–46.0)
Hemoglobin: 9.1 g/dL — ABNORMAL LOW (ref 12.0–15.0)
LYMPHS ABS: 1 10*3/uL (ref 0.7–4.0)
Lymphocytes Relative: 10 %
MCH: 29.4 pg (ref 26.0–34.0)
MCHC: 31.7 g/dL (ref 30.0–36.0)
MCV: 92.9 fL (ref 78.0–100.0)
MONO ABS: 0.6 10*3/uL (ref 0.1–1.0)
MONOS PCT: 6 %
NEUTROS PCT: 84 %
Neutro Abs: 8.4 10*3/uL — ABNORMAL HIGH (ref 1.7–7.7)
PLATELETS: 170 10*3/uL (ref 150–400)
RBC: 3.09 MIL/uL — AB (ref 3.87–5.11)
RDW: 21.7 % — AB (ref 11.5–15.5)
WBC: 10 10*3/uL (ref 4.0–10.5)

## 2017-02-21 LAB — COMPREHENSIVE METABOLIC PANEL
ALBUMIN: 2.6 g/dL — AB (ref 3.5–5.0)
ALT: 41 U/L (ref 14–54)
AST: 158 U/L — AB (ref 15–41)
Alkaline Phosphatase: 110 U/L (ref 38–126)
Anion gap: 8 (ref 5–15)
BUN: 5 mg/dL — AB (ref 6–20)
CHLORIDE: 103 mmol/L (ref 101–111)
CO2: 23 mmol/L (ref 22–32)
Calcium: 8.7 mg/dL — ABNORMAL LOW (ref 8.9–10.3)
Creatinine, Ser: 0.36 mg/dL — ABNORMAL LOW (ref 0.44–1.00)
GFR calc Af Amer: >60 mL/min (ref >60)
GFR calc non Af Amer: >60 mL/min (ref >60)
GLUCOSE: 111 mg/dL — AB (ref 65–99)
POTASSIUM: 4 mmol/L (ref 3.5–5.1)
Sodium: 134 mmol/L — ABNORMAL LOW (ref 135–145)
Total Bilirubin: 6.9 mg/dL — ABNORMAL HIGH (ref 0.3–1.2)
Total Protein: 8.2 g/dL — ABNORMAL HIGH (ref 6.5–8.1)

## 2017-02-21 LAB — GRAM STAIN

## 2017-02-21 LAB — I-STAT TROPONIN, ED: TROPONIN I, POC: 0.04 ng/mL (ref 0.00–0.08)

## 2017-02-21 LAB — PROTEIN, PLEURAL OR PERITONEAL FLUID: Total protein, fluid: <3 g/dL

## 2017-02-21 LAB — GLUCOSE, PLEURAL OR PERITONEAL FLUID: Glucose, Fluid: 121 mg/dL

## 2017-02-21 LAB — BRAIN NATRIURETIC PEPTIDE: B Natriuretic Peptide: 280.6 pg/mL — ABNORMAL HIGH (ref 0.0–100.0)

## 2017-02-21 LAB — ALBUMIN, PLEURAL OR PERITONEAL FLUID

## 2017-02-21 LAB — PROTIME-INR
INR: 1.54
Prothrombin Time: 18.6 seconds — ABNORMAL HIGH (ref 11.4–15.2)

## 2017-02-21 MED ORDER — VITAMIN B-1 100 MG PO TABS
100.0000 mg | ORAL_TABLET | Freq: Every day | ORAL | Status: DC
  Administered 2017-02-22 – 2017-02-24 (×3): 100 mg via ORAL
  Filled 2017-02-21 (×5): qty 1

## 2017-02-21 MED ORDER — LORAZEPAM 2 MG/ML IJ SOLN
0.5000 mg | Freq: Once | INTRAMUSCULAR | Status: AC | PRN
  Administered 2017-02-22: 0.5 mg via INTRAVENOUS
  Filled 2017-02-21: qty 1

## 2017-02-21 MED ORDER — IBUPROFEN 400 MG PO TABS: 400.0000 mg | ORAL_TABLET | Freq: Four times a day (QID) | ORAL | Status: DC | PRN

## 2017-02-21 MED ORDER — FUROSEMIDE 40 MG PO TABS
40.0000 mg | ORAL_TABLET | Freq: Every day | ORAL | Status: DC
  Administered 2017-02-21 – 2017-02-23 (×3): 40 mg via ORAL
  Filled 2017-02-21 (×3): qty 1

## 2017-02-21 MED ORDER — ONDANSETRON HCL 4 MG/2ML IJ SOLN: 4.0000 mg | Freq: Four times a day (QID) | INTRAMUSCULAR | Status: DC | PRN

## 2017-02-21 MED ORDER — FERROUS SULFATE 325 (65 FE) MG PO TABS
325.0000 mg | ORAL_TABLET | Freq: Two times a day (BID) | ORAL | Status: DC
  Administered 2017-02-21 – 2017-02-24 (×6): 325 mg via ORAL
  Filled 2017-02-21 (×6): qty 1

## 2017-02-21 MED ORDER — PANTOPRAZOLE SODIUM 20 MG PO TBEC
20.0000 mg | DELAYED_RELEASE_TABLET | Freq: Every day | ORAL | Status: DC
  Administered 2017-02-22 – 2017-02-24 (×3): 20 mg via ORAL
  Filled 2017-02-21 (×3): qty 1

## 2017-02-21 MED ORDER — PNEUMOCOCCAL VAC POLYVALENT 25 MCG/0.5ML IJ INJ
0.5000 mL | INJECTION | INTRAMUSCULAR | Status: AC
  Administered 2017-02-22: 0.5 mL via INTRAMUSCULAR
  Filled 2017-02-21: qty 0.5

## 2017-02-21 MED ORDER — ENSURE ENLIVE PO LIQD
237.0000 mL | Freq: Two times a day (BID) | ORAL | Status: DC
  Administered 2017-02-21 – 2017-02-24 (×6): 237 mL via ORAL

## 2017-02-21 MED ORDER — SPIRONOLACTONE 25 MG PO TABS
100.0000 mg | ORAL_TABLET | Freq: Every day | ORAL | Status: DC
  Administered 2017-02-21 – 2017-02-23 (×3): 100 mg via ORAL
  Filled 2017-02-21 (×3): qty 4

## 2017-02-21 MED ORDER — LOPERAMIDE HCL 2 MG PO CAPS
2.0000 mg | ORAL_CAPSULE | Freq: Three times a day (TID) | ORAL | Status: DC | PRN
Start: 2017-02-21 — End: 2017-02-24

## 2017-02-21 MED ORDER — ENOXAPARIN SODIUM 40 MG/0.4ML ~~LOC~~ SOLN
40.0000 mg | SUBCUTANEOUS | Status: DC
  Administered 2017-02-21 – 2017-02-23 (×3): 40 mg via SUBCUTANEOUS
  Filled 2017-02-21 (×3): qty 0.4

## 2017-02-21 MED ORDER — SODIUM CHLORIDE 0.9% FLUSH
3.0000 mL | Freq: Two times a day (BID) | INTRAVENOUS | Status: DC
  Administered 2017-02-21 – 2017-02-24 (×4): 3 mL via INTRAVENOUS

## 2017-02-21 MED ORDER — ONDANSETRON HCL 4 MG PO TABS: 4.0000 mg | ORAL_TABLET | Freq: Four times a day (QID) | ORAL | Status: DC | PRN

## 2017-02-21 MED ORDER — ESCITALOPRAM OXALATE 20 MG PO TABS
20.0000 mg | ORAL_TABLET | Freq: Every day | ORAL | Status: DC
  Administered 2017-02-22 – 2017-02-24 (×3): 20 mg via ORAL
  Filled 2017-02-21 (×4): qty 1

## 2017-02-21 MED ORDER — LIDOCAINE HCL (PF) 1 % IJ SOLN
INTRAMUSCULAR | Status: AC
  Filled 2017-02-21: qty 30

## 2017-02-21 MED ORDER — MELATONIN 3 MG PO TABS
3.0000 mg | ORAL_TABLET | Freq: Every evening | ORAL | Status: DC | PRN
  Administered 2017-02-22 – 2017-02-23 (×2): 3 mg via ORAL
  Filled 2017-02-21 (×4): qty 1

## 2017-02-21 MED ORDER — MELATONIN ER 5 MG PO TBCR: 5.0000 mg | EXTENDED_RELEASE_TABLET | Freq: Every evening | ORAL | Status: DC | PRN

## 2017-02-21 MED ORDER — LIDOCAINE HCL (PF) 1 % IJ SOLN
INTRAMUSCULAR | Status: DC | PRN
Start: 2017-02-21 — End: 2017-02-24
  Administered 2017-02-21: 5 mL

## 2017-02-21 NOTE — Procedures (Signed)
PROCEDURE SUMMARY:  Successful US guided right thoracentesis. Yielded 2.0 L of clear yellow fluid. Pt tolerated procedure well. No immediate complications.  Specimen was sent for labs. CXR ordered.  Brayton ElBRUNING, Anias Bartol PA-C 02/21/2017 3:18 PM

## 2017-02-21 NOTE — H&P (Signed)
Date: 02/21/2017               Patient Name:  Rhonda Haley MRN: [id number]  DOB: 07-Oct-1965 Age / Sex: 51 y.o., female   PCP: Lewis Moccasin, MD         Medical Service: Internal Medicine Teaching Service         Attending Physician: Dr. Gust Rung, DO    First Contact: Dr. Alinda Money Pager: [phone]  Second Contact: Dr. Dimple Casey Pager: [phone]       After Hours (After 5p/  First Contact Pager: [phone]  weekends / holidays): Second Contact Pager: [phone]   Chief Complaint: Worsening Shortness of Breath  History of Present Illness: Rhonda Haley is a 51 yo F with a Hx of Alcoholic liver disease and anemia presenting with worsening shortness of breath for 2-3 days. She had to sleep with her head and chest elevated  last night and only felt comfortable sleeping on her side the night before., but had been able to sleep laying flat prior to that. She felt her shortness of breath was worsened by her anxiety. She also endorses leg swelling for the past 2 weeks that would resolve with her legs elevated overnight, but return the following day. She also has noticed her abdomen becoming more distended and tight especiall when it was pointed out to her at her doctors appointment (she previously attributed it to normal weight gain.) She also been experiencing a cough for the last 2 months, which worsened in the past week. When asked about the color change (yellow) of her eyes, her husband stated that it had just started today. Her shortness of breath prompted her to see her primary care doctor who advised her to come to the ED.  Of note, she was seen in the ED on 01/20/17 for anemia (Hgb 6.6) and was transfuse blood. Her Hgb rose to 7.9 and was referred to Dr. Leone Payor at [facility]. Endoscopy and colonoscopy was performed on June 21 and was negative for signs of blood loss. She also quit drinking 4 weeks ago.  In the ED, CXR revealed large right pleural effusion, VIR was consulted and performed a  thoracentesis (collecting 2L clear yellow fluid), follow up CXR showed improvement of the R pleural effusion and she was also symptomatically improved by the time we saw her. She also had BNP 280, Trop 0.04, PT 18.6, CMP with AST 158, ALT 41, T.Bili 6.9 and EKG showing sinus tachycardia.  Meds:  Escitalopram 20 mg po daily Feeding Supplement BID AC Ferrous sulfate 325 mg po bid Pantoprazole 20 mg po daily  Thiamine 100 mg po daily Loperamide 2 mg po tid prn Loratadine 10 mg po daily prn Melatonin po daily prn  Allergies: Allergies as of 02/21/2017  . (No Known Allergies)   Past Medical History:  Diagnosis Date  . Alcoholic liver disease (HCC) 01/19/2017  . Allergy   . Anxiety   . Blood transfusion without reported diagnosis   . Symptomatic anemia 01/19/2017    Family History:  Family History  Problem Relation Age of Onset  . Brain cancer Mother   . Colon cancer Neg Hx   . Esophageal cancer Neg Hx   . Rectal cancer Neg Hx   . Stomach cancer Neg Hx    Social History: Social History   Social History  . Marital status: Married    Spouse name: N/A  . Number of children: N/A  . Years of education: N/A  Social History Main Topics  . Smoking status: Current Every Day Smoker    Packs/day: 0.50    Years: 28.00    Types: Cigarettes  . Smokeless tobacco: Never Used  . Alcohol use No     Comment: quit  . Drug use: No  . Sexual activity: Yes   Other Topics Concern  . None   Social History Narrative  . None   Review of Systems: A complete ROS was negative except as per HPI.  Physical Exam: Blood pressure 112/64, pulse (!) 104, temperature 98.1 F (36.7 C), temperature source Oral, resp. rate 20, height 5' 8.5" (1.74 m), weight 144 lb (65.3 kg), SpO2 100 %. Physical Exam  Constitutional: She is oriented to person, place, and time. She appears well-developed and well-nourished. No distress.  HENT:  Head: Normocephalic and atraumatic.  Eyes: EOM are normal. Scleral  icterus is present.  Cardiovascular: Regular rhythm.  Exam reveals no gallop and no friction rub.   No murmur heard. Tachycardic  Pulmonary/Chest: No respiratory distress.  RLL- no breath sounds Mid RL - Crackles LL - Crackles Mid LL - Crackles Bilat Apicies - Clear  Abdominal: Bowel sounds are normal. She exhibits distension. There is no tenderness.  Hepatomegaly   Musculoskeletal: She exhibits no deformity.  2+ pitting edema bilat LE Negative Asterixis  Neurological: She is alert and oriented to person, place, and time.  Skin: Skin is warm and dry.  2-6060m erythematous macules over bilat LE     EKG: personally reviewed, agree with: - Sinus tachycardia - Rightward axis - Borderline ECG - No significant change since last tracing  CXR: - INITIAL CXR: IMPRESSION: Very large right pleural effusion with collapse of the right lung. This may be due to liver failure. Also consider infection and tumor. Thoracentesis suggested.  - Post Thoracentesis CXR: IMPRESSION: 1. Status post right thoracentesis with reduced right pleural effusion and no pneumothorax. Moderate residual with right lung base atelectasis or consolidation. 2. Questionable abnormal right hilar contour. If the pleural fluid analysis is unrevealing then recommend follow-up Chest CT (IV contrast preferred).  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 Differential includes Hepatic Hydrothorax (supported by R sided presentation, Hx of liver disease, and coexisting stigmata including physical exam consistent with ascites, scleral icterus, and increased liver span); L Heart Failure (less likely due to unilateral presentation, and no prior history of heart disease); Infectious process (Less likely due to lack of leukocytosis, no pain), Other liver etiology. She received thoracentesis yielding 2L of clear yellow fluid. - IR Thoracentesis: 2L, sent for TGs, Cell Count, Cytology,  Culture  - Albumin: <1.0 (Serum 2.6)  - Protein: <3.0 (Serum 8.2)  - Glucose: 121 (Serum 111)  Alcoholic Liver Disease: With worsening ascites, edema, elevated T.Bili (6.9) with scleral icterus, and possible hepatic hydrothorax. 2016 MELD: 21 - Consult GI - Lasix 40mg  Daily - Spironolactone 100mg  Daily - MRI Liver - Continue Thiamine  Anemia: - Ferrous Sulfate 325mg  Daily  GERD: - Protonix 20mg  Daily  Anxiety: - Continue home Lexipro  F/E/N: Heart Diet DVT Phrophylaxis: Lovenox   Dispo: Admit patient to Inpatient with expected length of stay greater than 2 midnights.   Signed: Beola CordAlexander Melvin, DO IM PGY-1 Pager: [phone]

## 2017-02-21 NOTE — ED Triage Notes (Signed)
Per Pt, Pt is coming from PCP with complaints of SOB that started a couple days ago. Pt reports some anxiety, but exertion has worsened the SOB. Pt has hx of liver failure and anemia.

## 2017-02-21 NOTE — ED Notes (Signed)
Pt returns from IR and states she is feeling much better. Husband at bedside

## 2017-02-21 NOTE — ED Provider Notes (Signed)
[facility]-EMERGENCY DEPT Provider Note   CSN: [id number] Arrival date & time: 02/21/17  1003     History   Chief Complaint Chief Complaint  Patient presents with  . Shortness of Breath    HPI Rhonda Haley is a 51 y.o. female.  Pt reports shortness of breath increasing for the last four days.  Last night after dinner she was so short of breath she felt like she couldn't breathe and was tachypneic and tachycardic.  She was seen June 15 for profound weakness and was found to have a hgb of 4 and alcoholic liver disease.  She was given blood and her hgb rose to 7.9 and she was referred to Dr. Leone Payor at [facility].  Endoscopy and colonoscopy was performed on June 21 and was negative for signs of blood loss.  Pt denies any chest pain, fever, nausea, vomiting,  myalgias or arthralgias associated with the shortness of breath.  Pt reports she stopped drinking 4 weeks ago and has not had a drink since.        Past Medical History:  Diagnosis Date  . Alcoholic liver disease (HCC) 01/19/2017  . Allergy   . Anxiety   . Blood transfusion without reported diagnosis   . Symptomatic anemia 01/19/2017    Patient Active Problem List   Diagnosis Date Noted  . Diarrhea   . Symptomatic anemia 01/19/2017  . Alcoholic liver disease (HCC) 01/19/2017    Past Surgical History:  Procedure Laterality Date  . FERTILITY SURGERY  2003   in vitro fertilization  . WISDOM TOOTH EXTRACTION     age 45    OB History    No data available       Home Medications    Prior to Admission medications   Medication Sig Start Date End Date Taking? Authorizing Provider  escitalopram (LEXAPRO) 20 MG tablet Take 20 mg by mouth daily. 01/18/17   [provider]  feeding supplement, ENSURE ENLIVE, (ENSURE ENLIVE) LIQD Take 237 mLs by mouth 2 (two) times daily between meals. 01/21/17   Ghimire, Werner Lean, MD  ferrous sulfate 325 (65 FE) MG tablet Take 1 tablet (325 mg total) by mouth 2 (two) times daily with a  meal. 01/20/17   Ghimire, Werner Lean, MD  loperamide (IMODIUM A-D) 2 MG tablet Take 1 tablet (2 mg total) by mouth 3 (three) times daily as needed for diarrhea or loose stools. 01/20/17   Ghimire, Werner Lean, MD  loratadine (CLARITIN) 10 MG tablet Take 10 mg by mouth daily as needed for allergies.    [provider]  MELATONIN ER PO Take by mouth as needed.    [provider]  pantoprazole (PROTONIX) 20 MG tablet Take 1 tablet (20 mg total) by mouth daily before breakfast. 02/01/17   Iva Boop, MD  thiamine (VITAMIN B-1) 100 MG tablet Take 1 tablet (100 mg total) by mouth daily. 01/20/17   Ghimire, Werner Lean, MD    Family History Family History  Problem Relation Age of Onset  . Brain cancer Mother   . Colon cancer Neg Hx   . Esophageal cancer Neg Hx   . Rectal cancer Neg Hx   . Stomach cancer Neg Hx     Social History Social History  Substance Use Topics  . Smoking status: Current Every Day Smoker    Packs/day: 0.50    Years: 28.00    Types: Cigarettes  . Smokeless tobacco: Never Used  . Alcohol use No  Comment: quit     Allergies   Patient has no known allergies.   Review of Systems Review of Systems  Constitutional: Negative for chills, diaphoresis, fatigue and fever.  HENT: Negative for ear pain and sore throat.   Eyes: Negative for pain and visual disturbance.  Respiratory: Positive for cough, chest tightness and shortness of breath.   Cardiovascular: Positive for leg swelling. Negative for chest pain and palpitations.  Gastrointestinal: Negative for abdominal pain, blood in stool, nausea and vomiting.  Genitourinary: Negative for dysuria and hematuria.  Musculoskeletal: Negative for arthralgias, back pain, myalgias and neck pain.  Skin: Negative for color change and rash.  Neurological: Negative for seizures and syncope.  All other systems reviewed and are negative.    Physical Exam Updated Vital Signs BP 137/75   Pulse (!) 111   Temp  98.1 F (36.7 C) (Oral)   Resp (!) 32   Ht 5' 8.5" (1.74 m)   Wt 65.3 kg (144 lb)   SpO2 95%   BMI 21.58 kg/m   Physical Exam  Constitutional: She appears well-developed and well-nourished. No distress.  HENT:  Head: Normocephalic and atraumatic.  Eyes: Conjunctivae are normal.  Neck: Neck supple.  Cardiovascular: Normal rate and regular rhythm.   No murmur heard. Pulmonary/Chest: Tachypnea noted. She is in respiratory distress. She has decreased breath sounds in the right upper field, the right middle field and the right lower field. She has rales in the left middle field and the left lower field.  Abdominal: Soft. There is no tenderness.  Musculoskeletal: She exhibits no edema.  Neurological: She is alert.  Skin: Skin is warm and dry.  Psychiatric: She has a normal mood and affect.  Nursing note and vitals reviewed.    ED Treatments / Results  Labs (all labs ordered are listed, but only abnormal results are displayed) Labs Reviewed  CBC WITH DIFFERENTIAL/PLATELET - Abnormal; Notable for the following:       Result Value   RBC 3.09 (*)    Hemoglobin 9.1 (*)    HCT 28.7 (*)    RDW 21.7 (*)    All other components within normal limits  COMPREHENSIVE METABOLIC PANEL - Abnormal; Notable for the following:    Sodium 134 (*)    Glucose, Bld 111 (*)    BUN 5 (*)    Creatinine, Ser 0.36 (*)    Calcium 8.7 (*)    Total Protein 8.2 (*)    Albumin 2.6 (*)    AST 158 (*)    Total Bilirubin 6.9 (*)    All other components within normal limits  PROTIME-INR - Abnormal; Notable for the following:    Prothrombin Time 18.6 (*)    All other components within normal limits  BRAIN NATRIURETIC PEPTIDE  I-STAT TROPOININ, ED    EKG  EKG Interpretation  Date/Time:  Tuesday February 21 2017 10:11:29 EDT Ventricular Rate:  120 PR Interval:  126 QRS Duration: 74 QT Interval:  344 QTC Calculation: 486 R Axis:   96 Text Interpretation:  Sinus tachycardia Rightward axis Borderline ECG  No significant change since last tracing Confirmed by Alvira MondaySchlossman, Erin ([id number]) on 02/21/2017 12:03:10 PM       Radiology Dg Chest 2 View  Result Date: 02/21/2017 CLINICAL DATA:  Short of breath.  Alcoholic liver disease EXAM: CHEST  2 VIEW COMPARISON:  Ultrasound abdomen 01/20/2017 FINDINGS: Large right pleural effusion. The majority the right lung is collapsed with small amount of aerated right upper lobe. Mediastinal  structures shifted to the left. Note is made of a smaller right effusion on recent ultrasound. Left lung is clear without infiltrate or effusion. Heart size normal and vascularity normal. IMPRESSION: Very large right pleural effusion with collapse of the right lung. This may be due to liver failure. Also consider infection and tumor. Thoracentesis suggested. Electronically Signed   By: Marlan Palau M.D.   On: 02/21/2017 10:45    Procedures Procedures (including critical care time)  Medications Ordered in ED Medications - No data to display   Initial Impression / Assessment and Plan / ED Course  I have reviewed the triage vital signs and the nursing notes.  Pertinent labs & imaging results that were available during my care of the patient were reviewed by me and considered in my medical decision making (see chart for details).     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

## 2017-02-21 NOTE — H&P (Signed)
Date: 02/21/2017               Patient Name:  Rhonda Haley MRN: [id number]  DOB: [birth year] Age / Sex: 51 y.o., female   PCP: Fanny Bien, MD              Medical Service: Internal Medicine Teaching Service              Attending Physician: Dr. Lucious Groves, DO    First Contact: Lucendia Herrlich, MS III Pager: [phone]  Second Contact: Dr. Trilby Drummer Pager: [phone]  Third Contact Dr. Benjamine Mola Pager: [phone]       After Hours (After 5p/  First Contact Pager: [phone]  weekends / holidays): Second Contact Pager: [phone]   Chief Complaint: Shortness of breath  History of Present Illness: Rhonda Haley is a 51yo F with a history of anemia, liver failure and anxiety who presents to the ED for evaluation of new onset shortness of breath. She endorses that her difficulty breathing started two days ago. This was exacerbated by her anxiety because she worried she "was going to die." Her husband describes her breathing as "labored, short, panting like a racehorse." The patient reports that she has chest tightness and could not sleep lying flat in bed yesterday. Her husband noticed her "yellow eyes" just started today. Additionally, she reports LE edema that started 2 weeks ago; the swelling waxes and wanes. She also reports an ongoing cough that started approximately 2 months ago. The cough has a "deep, rattly" quality and produces little phlegm. Pt also feels like her stomach is swollen but is unsure when this started.  Patient was admitted recently on 01/19/17 for intermittent diarrhea since March that is now resolved. At the time her Hg was 6.6. She was transfused and her Hg rose to 7.9. She was found to have liver cirrhosis. June colonoscopy and endoscopy were unremarkable. She denies current abdominal pain or appetite changes.   Meds: Escitalopram 20 mg po daily Ferrous sulfate 325 mg po bid Pantoprazole 20 mg po daily  Thiamine 100 mg po daily Loperamide 2 mg po tid prn Loratadine 10 mg po  daily prn Melatonin po daily prn  Allergies: Allergies as of 02/21/2017  . (No Known Allergies)   Past Medical History:  Diagnosis Date  . Alcoholic liver disease ([place]) 01/19/2017  . Allergy   . Anxiety   . Blood transfusion without reported diagnosis   . Symptomatic anemia 01/19/2017   Past Surgical History:  Procedure Laterality Date  . FERTILITY SURGERY  2003   in vitro fertilization  . IR THORACENTESIS ASP PLEURAL SPACE W/IMG GUIDE  02/21/2017  . WISDOM TOOTH EXTRACTION     age 24   Family History  Problem Relation Age of Onset  . Brain cancer Mother   . Colon cancer Neg Hx   . Esophageal cancer Neg Hx   . Rectal cancer Neg Hx   . 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 She lives with her husband. She currently smokes 0.5 packs of cigarettes a day and has been smoking for about 20 years. She reports heavy alcohol use in her past but quit 1 month ago when she was diagnosed with liver cirrhosis.  Review of Systems: Pertinent items are noted in HPI.  Physical Exam: Blood pressure 112/64, pulse (!) 104, temperature 98.1 F (36.7 C), temperature source Oral, resp. rate 20, height 5' 8.5" (1.74 m), weight 65.3  kg (144 lb), SpO2 100 %. BP 112/64 (BP Location: Right Arm)   Pulse (!) 104   Temp 98.1 F (36.7 C) (Oral)   Resp 20   Ht 5' 8.5" (1.74 m)   Wt 65.3 kg (144 lb)   SpO2 100%   BMI 21.58 kg/m  General appearance: alert, cooperative and appears stated age Eyes: negative findings: lids and lashes normal, corneas clear and pupils equal, round, reactive to light and accomodation, positive findings: sclera icterus Lungs: No acute distress. Crackles in R middle lobe, L lower and middle lobes. No breath sounds in R lower lobe. Heart: tachycardic and regular rhythm, S1, S2 normal, no murmur, click, rub or gallop Abdomen: normal findings: bowel sounds normal, no masses palpable, no scars, striae, dilated veins, rashes,  or lesions, umbilicus normal and nontender and abnormal findings:  ascites, distended and hepatomegaly Extremities: bilateral lower extremity 2+ pitting edema, DP pulses were not appreciated d/t edema Skin: Skin color, texture, normal. Numerous 2-25m erythematous macules over bilateral lower calves and ankles which pt attributes to flea bites.  Neurologic: Grossly normal. No asterixis.  Lab results: Hg/HCT 9.1/28.7 Na 134 K 4 Cl 103 CO2 23 Glu 111 BUN 5 Cr 0.36 Ca 8.7 Tot Protein 8.2 Albumin 2.6 AST/ALT 158/41 Bili 6.9 BNP 280.6  Imaging results:  Dg Chest 1 View  Result Date: 02/21/2017 CLINICAL DATA:  51year old female status post ultrasound-guided right lung thoracentesis today. EXAM: CHEST 1 VIEW COMPARISON:  PA and lateral chest radiographs 1042 hours today. FINDINGS: PA view of the chest at 1519 hours. Significantly reduced volume of right pleural fluid with improved right mid and upper lung aeration. Up to moderate residual right pleural effusion. No pneumothorax. Right infrahilar air bronchograms. Possible right hilar contour enlargement. Other mediastinal contours are within normal limits. Visualized tracheal air column is within normal limits. Stable and negative left lung. No acute osseous abnormality identified. IMPRESSION: 1. Status post right thoracentesis with reduced right pleural effusion and no pneumothorax. Moderate residual with right lung base atelectasis or consolidation. 2. Questionable abnormal right hilar contour. If the pleural fluid analysis is unrevealing then recommend follow-up Chest CT (IV contrast preferred). Electronically Signed   By: HGenevie AnnM.D.   On: 02/21/2017 15:37   Dg Chest 2 View  Result Date: 02/21/2017 CLINICAL DATA:  Short of breath.  Alcoholic liver disease EXAM: CHEST  2 VIEW COMPARISON:  Ultrasound abdomen 01/20/2017 FINDINGS: Large right pleural effusion. The majority the right lung is collapsed with small amount of aerated right upper lobe.  Mediastinal structures shifted to the left. Note is made of a smaller right effusion on recent ultrasound. Left lung is clear without infiltrate or effusion. Heart size normal and vascularity normal. IMPRESSION: Very large right pleural effusion with collapse of the right lung. This may be due to liver failure. Also consider infection and tumor. Thoracentesis suggested. Electronically Signed   By: CFranchot GalloM.D.   On: 02/21/2017 10:45   Ir Thoracentesis Asp Pleural Space W/img Guide  Result Date: 02/21/2017 INDICATION: Shortness of breath, right-sided pleural effusion. Request diagnostic and therapeutic thoracentesis. EXAM: ULTRASOUND GUIDED RIGHT THORACENTESIS MEDICATIONS: None. COMPLICATIONS: None immediate. Postprocedural chest x-ray negative for pneumothorax PROCEDURE: An ultrasound guided thoracentesis was thoroughly discussed with the patient and questions answered. The benefits, risks, alternatives and complications were also discussed. The patient understands and wishes to proceed with the procedure. Written consent was obtained. Ultrasound was performed to localize and mark an adequate pocket of fluid in the right chest.  The area was then prepped and draped in the normal sterile fashion. 1% Lidocaine was used for local anesthesia. Under ultrasound guidance a Safe-T-Centesis catheter was introduced. Thoracentesis was performed. The catheter was removed and a dressing applied. FINDINGS: A total of approximately 2.0 L of clear yellow fluid was removed. Samples were sent to the laboratory as requested by the clinical team. IMPRESSION: Successful ultrasound guided right thoracentesis yielding 2.0 L of pleural fluid. Read by: Ascencion Dike PA-C Electronically Signed   By: Aletta Edouard M.D.   On: 02/21/2017 15:53   Other results: EKG: sinus tachycardia, rightward axis.  Assessment & Plan by Problem: Principal Problem:   Hydrothorax Active Problems:   Alcoholic liver disease ([place])  Rhonda Haley is a 51yo F with a history of anemia, liver failure and anxiety who presents to the ED for evaluation of new onset shortness of breath. CXR in the ED revealed R pleural effusion and collapse of R lung, possible d/t liver failure. Pt reports feeling much better after fluid removal.   Hydrothorax Pleural fluid albumin <1, glucose 121, total protein <3 (serum 8.2), WBC 88, Neutrophil 5. Does not meet Light's criteria, suggesting transudative effusion. Differential includes heptaic hydrothorax (R sided presentation, hx of liver failure, PE finding above), infection (but no leukocytosis, pain, fever). - R thoracentesis removed 2L of clear yellow fluid.  - Pleural fluid triglycerides, cytology, culture, gram stain pending - Repeat CBC and CMP in AM  Alcoholic liver disease Physical exam positive for ascites, scleral icterus, hepatomegaly, LE 2+ pitting edema; negative for asterixis, spider angiomas. Elevated bili and AST. ALT and alk phos wnl. - Liver MRI w contrast ordered for AM. Consider paracentesis post imaging. - Start furosemide 40 mg po daily and spironolactone 100 mg po daily - Continue thiamine 100 mg po daily - Consult [facility] (Dr. Carlean Purl) in AM  Anemia Asymptomatic, monitor. - Continue ferrous sulfate 325 mg po bid  GERD - Continue Protonix 20 mg po daily  Anxiety - Continue escitalopram 20 mg po daily  DVT prophy: enoxaparin 40 mg daily  FEN: heart diet  Dispo: Expected hospital stay >2 nights.  This is a Careers information officer Note.  The care of the patient was discussed with Dr. Heber Vina and the assessment and plan was formulated with their assistance.  Please see their note for official documentation of the patient encounter.   Signed: Murlean Caller, Medical Student 02/21/2017, 7:12 PM  Pager: [phone]

## 2017-02-21 NOTE — ED Notes (Signed)
Pt found standing in bathroom without oxygen. Assisted back to bed and placed back on oxygen.

## 2017-02-21 NOTE — ED Notes (Signed)
Pt woken from sleep where she had sonorous respirations. Pt sttes she has no pain. explalinresd to pt that she could not eat or drink anything before procedure to improve breathing.

## 2017-02-22 ENCOUNTER — Inpatient Hospital Stay (HOSPITAL_COMMUNITY): Payer: 59

## 2017-02-22 LAB — COMPREHENSIVE METABOLIC PANEL
ALT: 36 U/L (ref 14–54)
AST: 148 U/L — ABNORMAL HIGH (ref 15–41)
Albumin: 2.1 g/dL — ABNORMAL LOW (ref 3.5–5.0)
Alkaline Phosphatase: 95 U/L (ref 38–126)
Anion gap: 7 (ref 5–15)
BUN: <5 mg/dL — ABNORMAL LOW (ref 6–20)
CO2: 26 mmol/L (ref 22–32)
Calcium: 8.1 mg/dL — ABNORMAL LOW (ref 8.9–10.3)
Chloride: 102 mmol/L (ref 101–111)
Creatinine, Ser: 0.48 mg/dL (ref 0.44–1.00)
GFR calc Af Amer: >60 mL/min (ref >60)
GFR calc non Af Amer: >60 mL/min (ref >60)
Glucose, Bld: 89 mg/dL (ref 65–99)
Potassium: 3.2 mmol/L — ABNORMAL LOW (ref 3.5–5.1)
Sodium: 135 mmol/L (ref 135–145)
Total Bilirubin: 6.6 mg/dL — ABNORMAL HIGH (ref 0.3–1.2)
Total Protein: 6.7 g/dL (ref 6.5–8.1)

## 2017-02-22 LAB — TRIGLYCERIDES, BODY FLUIDS: Triglycerides, Fluid: 29 mg/dL

## 2017-02-22 LAB — CBC
HCT: 25.3 % — ABNORMAL LOW (ref 36.0–46.0)
Hemoglobin: 8 g/dL — ABNORMAL LOW (ref 12.0–15.0)
MCH: 29.5 pg (ref 26.0–34.0)
MCHC: 31.6 g/dL (ref 30.0–36.0)
MCV: 93.4 fL (ref 78.0–100.0)
Platelets: 142 10*3/uL — ABNORMAL LOW (ref 150–400)
RBC: 2.71 MIL/uL — ABNORMAL LOW (ref 3.87–5.11)
RDW: 21.9 % — ABNORMAL HIGH (ref 11.5–15.5)
WBC: 9.5 10*3/uL (ref 4.0–10.5)

## 2017-02-22 MED ORDER — GADOBENATE DIMEGLUMINE 529 MG/ML IV SOLN
15.0000 mL | Freq: Once | INTRAVENOUS | Status: AC | PRN
  Administered 2017-02-22: 13 mL via INTRAVENOUS

## 2017-02-22 MED ORDER — ORAL CARE MOUTH RINSE
15.0000 mL | Freq: Two times a day (BID) | OROMUCOSAL | Status: DC
  Administered 2017-02-22 – 2017-02-23 (×3): 15 mL via OROMUCOSAL

## 2017-02-22 MED ORDER — POTASSIUM CHLORIDE CRYS ER 20 MEQ PO TBCR
40.0000 meq | EXTENDED_RELEASE_TABLET | Freq: Once | ORAL | Status: AC
  Administered 2017-02-22: 40 meq via ORAL
  Filled 2017-02-22: qty 2

## 2017-02-22 NOTE — Progress Notes (Signed)
Subjective: Rhonda Haley was feeling better this morning. She was on 4L O2 by nasal cannula. She was not experiencing any shortness of breath. She had already had her MRI and the results were reviewed with her in the room. She states she had some anxiety while in the MRI machine but was feeling better. Her scleral icterus appears to be improving today. She was informed of the plan to monitor her liver function for a day or two, continue pulling of fluid with diuretics, and to get an ECHO.   Objective:  Vital signs in last 24 hours: Vitals:   02/21/17 1615 02/21/17 1653 02/21/17 2143 02/22/17 0605  BP: 125/72 112/64 (!) 124/57 (!) 101/57  Pulse: (!) 112 (!) 104 (!) 121 (!) 124  Resp: (!) 25 20 19  (!) 22  Temp:  98.1 F (36.7 C) 98.9 F (37.2 C) 98.2 F (36.8 C)  TempSrc:  Oral Oral Oral  SpO2: 100% 100% 98% 98%  Weight:      Height:  5' 8.5" (1.74 m)     Physical Exam  Constitutional: She is oriented to person, place, and time. She appears well-developed and well-nourished. No distress.  HENT:  Head: Normocephalic and atraumatic.  Eyes: EOM are normal. Scleral icterus is present.  Cardiovascular: Regular rhythm.  Exam reveals no gallop and no friction rub.   No murmur heard. Tachcardic  Pulmonary/Chest: Effort normal. No respiratory distress.  - L Basilar: Faint Crackles - R Basilar: No breath sound auscultated - Mid R: Crackles - Mid L, and Bilat Apices: Clear  Musculoskeletal: She exhibits no deformity.  2+ Pitting Edema  Neurological: She is alert and oriented to person, place, and time.  Skin: Skin is warm and dry.  2-8143m erythematous macules over bilat LE    MRI Liver W WO Contrast: IMPRESSION: 1. Exam detail is diminished secondary to motion artifact. Patient felt short of breath which was worsened by anxiety. 2. Morphologic features of liver compatible with cirrhosis. There is stigmata of portal venous hypertension including splenomegaly, varicosities and abdominal  ascites. 3. Arterial phase enhancing structure within the left lobe of liver measures less than 1 cm and is considered a LR-3, which is indeterminate. Followup imaging in 6 months with repeat liver protocol MRI is advised. 4. Indeterminate enhancing lesion arising from posterior cortex of right kidney is identified. Difficult to assess due to motion artifact. Cannot rule out solid enhancing neoplasm such as renal cell carcinoma. Consider further evaluation with renal protocol CT which may be less susceptible to respiratory motion artifact. 5. Right pleural effusion. 6. Gallstones.  Assessment/Plan:  Hydrothorax: In the setting of known liver disease. Differential includes Hepatic Hydrothorax (supported by R sided presentation, Hx of liver disease, transudative nature of the fluid, and coexisting stigmata including physical exam consistent with ascites, scleral icterus, and increased liver span); L Heart Failure (less likely due to unilateral presentation, and no prior history of heart disease); Infectious process (unlikely due to lack of leukocytosis, no pain, negative gram stain), other etiology. She received thoracentesis yielding 2L of clear yellow fluid. - IR Thoracentesis: 2L, sent for TGs,Cytology             - Albumin: <1.0 (Serum 2.6)             - Protein: <3.0 (Serum 8.2)             - Glucose: 121 (Serum 111)  - Cell Count: WNL, Hazy yellow fluid  - Gram stain: Negative  - Culture:  Pending  - TGs, Cytology: Pending - TTE to rule out cardiac component  Alcoholic Liver Cirrhosis: With worsening ascites, edema, elevated T.Bili (6.9) with scleral icterus, and possible hepatic hydrothorax. 2016 MELD: 21 at admission. She quit alcohol use 1 month ago. MRI Liver was complicated by motion artifact, but showed features compatible with cirrhosis, lesion on liver with follow up of 6 moths recommended, and gall stones. - Consult GI - Lasix 40mg  Daily - Spironolactone 100mg  Daily - Discriminate  function score: 22  - Daily CMP  - Daily PT/INR  - Continue Thiamine  Anemia: - Ferrous Sulfate 325mg  Daily  GERD: - Protonix 20mg  Daily  Anxiety: - Continue home Lexipro  Hypokalemia (3.2) - PO  F/E/N: Heart Diet DVT Phrophylaxis: Lovenox   Dispo: Anticipated discharge in approximately 1-2 day(s).  Beola Cord, DO IM PGY-1 Pager: [phone]

## 2017-02-22 NOTE — Discharge Instructions (Signed)

## 2017-02-22 NOTE — Progress Notes (Signed)
Subjective: Rhonda Haley endorses feeling well today. She denies chest pain, difficulty breathing, labored breathing or abdominal pain. She does endorse ongoing cough with little phlegm production. She reports urinating 4x after getting her diuretics. Her sclera icterus looks slightly improved. She had some anxiety while in the MRI machine, but is back at her baseline. We discussed the need to monitor her liver function to make sure it does not worsen over the next couple of days. Objective: Vital signs in last 24 hours: Vitals:   02/21/17 1615 02/21/17 1653 02/21/17 2143 02/22/17 0605  BP: 125/72 112/64 (!) 124/57 (!) 101/57  Pulse: (!) 112 (!) 104 (!) 121 (!) 124  Resp: (!) 25 20 19  (!) 22  Temp:  98.1 F (36.7 C) 98.9 F (37.2 C) 98.2 F (36.8 C)  TempSrc:  Oral Oral Oral  SpO2: 100% 100% 98% 98%  Weight:      Height:  5' 8.5" (1.74 m)     Weight change:  No intake or output data in the 24 hours ending 02/22/17 1320 BP (!) 101/57 (BP Location: Left Arm)   Pulse (!) 124   Temp 98.2 F (36.8 C) (Oral)   Resp (!) 22   Ht 5' 8.5" (1.74 m)   Wt 65.3 kg (144 lb)   SpO2 98%   BMI 21.58 kg/m  General appearance: alert, cooperative and appears stated age Eyes: negative findings: corneas clear and pupils equal, round, reactive to light and accomodation, positive findings: sclera icterus Lungs: faint L basilar crackles, no lung sounds on R, R middle and basilar crackles Heart: tachycardic and regular rhythm, S1, S2 normal, no murmur, click, rub or gallop Abdomen: normal findings: bowel sounds normal, no bruits heard, no masses palpable and no scars, striae, dilated veins, rashes, or lesions and abnormal findings:  ascites, distended and hepatomegaly Extremities: bilat LE 2+ pitting edema, edematous areas are slightly tender to palpation Neurologic: Grossly normal Lab Results: Hg/HCT 8/25.3 PLT 142 Na 135 K 3.2 Cl 102 CO2 26 Glu 89 Cr 0.48 BUN <5 Albumin 2.1 AST/ALT 148/36 Alk  phos 95 Bili 6.6 Micro Results: Recent Results (from the past 240 hour(s))  Gram stain     Status: None   Collection Time: 02/21/17  3:11 PM  Result Value Ref Range Status   Specimen Description FLUID PLEURAL  Final   Special Requests NONE  Final   Gram Stain   Final    MODERATE WBC PRESENT, PREDOMINANTLY MONONUCLEAR NO ORGANISMS SEEN    Report Status 02/21/2017 FINAL  Final   Studies/Results: Dg Chest 1 View  Result Date: 02/21/2017 CLINICAL DATA:  51 year old female status post ultrasound-guided right lung thoracentesis today. EXAM: CHEST 1 VIEW COMPARISON:  PA and lateral chest radiographs 1042 hours today. FINDINGS: PA view of the chest at 1519 hours. Significantly reduced volume of right pleural fluid with improved right mid and upper lung aeration. Up to moderate residual right pleural effusion. No pneumothorax. Right infrahilar air bronchograms. Possible right hilar contour enlargement. Other mediastinal contours are within normal limits. Visualized tracheal air column is within normal limits. Stable and negative left lung. No acute osseous abnormality identified. IMPRESSION: 1. Status post right thoracentesis with reduced right pleural effusion and no pneumothorax. Moderate residual with right lung base atelectasis or consolidation. 2. Questionable abnormal right hilar contour. If the pleural fluid analysis is unrevealing then recommend follow-up Chest CT (IV contrast preferred). Electronically Signed   By: Genevie Ann M.D.   On: 02/21/2017 15:37   Dg Chest  2 View  Result Date: 02/21/2017 CLINICAL DATA:  Short of breath.  Alcoholic liver disease EXAM: CHEST  2 VIEW COMPARISON:  Ultrasound abdomen 01/20/2017 FINDINGS: Large right pleural effusion. The majority the right lung is collapsed with small amount of aerated right upper lobe. Mediastinal structures shifted to the left. Note is made of a smaller right effusion on recent ultrasound. Left lung is clear without infiltrate or effusion.  Heart size normal and vascularity normal. IMPRESSION: Very large right pleural effusion with collapse of the right lung. This may be due to liver failure. Also consider infection and tumor. Thoracentesis suggested. Electronically Signed   By: Franchot Gallo M.D.   On: 02/21/2017 10:45   Mr Liver W VO Contrast  Result Date: 02/22/2017 CLINICAL DATA:  Cirrhosis.  Shortness of breath for 2-3 days. EXAM: MRI ABDOMEN WITHOUT AND WITH CONTRAST TECHNIQUE: Multiplanar multisequence MR imaging of the abdomen was performed both before and after the administration of intravenous contrast. CONTRAST:  49m MULTIHANCE GADOBENATE DIMEGLUMINE 529 MG/ML IV SOLN COMPARISON:  None. FINDINGS: Significantly diminished exam detail secondary to motion artifact. Patient felt short of breath which was worsened by anxiety. Lower chest: Large right pleural effusion is again noted. Hepatobiliary: Hypertrophy of the lateral segment of left lobe of liver identified. There is a macronodular contour of the liver compatible with cirrhosis. 6 mm arterial phase enhancing structure within the lateral segment of left lobe of liver is identified, image 47 of series 1101 no no definite washout or capsule identified. Findings compatible with LR-3 lesion. Small cyst in the medial segment of left lobe of liver measures 5 mm. There is diffuse edema involving the wall of the gallbladder likely secondary to cirrhosis and portal venous hypertension. Possible tiny stones noted within the dependent portion of the gallbladder, image 29 of series 5 Pancreas: No mass, inflammatory changes, or other parenchymal abnormality identified. Spleen:  The spleen is enlarged measuring 15.7 cm Adrenals/Urinary Tract: Normal appearance of the adrenal glands. Arising from the posterior aspect of the right kidney there is a 2.3 cm exophytic structure which is T2 hypointense in T1 isointense. Avid enhancement following the IV administration of contrast material is noted.  Unremarkable appearance of the left kidney. No hydronephrosis identified. Stomach/Bowel: The stomach appears normal. There is mild diffuse edema involving the central small bowel loops. No abnormal bowel dilatation noted. Vascular/Lymphatic: No abdominal aortic aneurysm. Small upper abdominal varicosities are identified. No adenopathy noted within the upper abdomen. Other: There is mild upper abdominal ascites. Diffuse body wall edema is noted. Musculoskeletal: No abnormal signal or enhancement identified from within the bone marrow. IMPRESSION: 1. Exam detail is diminished secondary to motion artifact. Patient felt short of breath which was worsened by anxiety. 2. Morphologic features of liver compatible with cirrhosis. There is stigmata of portal venous hypertension including splenomegaly, varicosities and abdominal ascites. 3. Arterial phase enhancing structure within the left lobe of liver measures less than 1 cm and is considered a LR-3, which is indeterminate. Followup imaging in 6 months with repeat liver protocol MRI is advised. 4. Indeterminate enhancing lesion arising from posterior cortex of right kidney is identified. Difficult to assess due to motion artifact. Cannot rule out solid enhancing neoplasm such as renal cell carcinoma. Consider further evaluation with renal protocol CT which may be less susceptible to respiratory motion artifact. 5. Right pleural effusion. 6. Gallstones. Electronically Signed   By: TKerby MoorsM.D.   On: 02/22/2017 09:49   Ir Thoracentesis Asp Pleural Space W/img Guide  Result Date: 02/21/2017 INDICATION: Shortness of breath, right-sided pleural effusion. Request diagnostic and therapeutic thoracentesis. EXAM: ULTRASOUND GUIDED RIGHT THORACENTESIS MEDICATIONS: None. COMPLICATIONS: None immediate. Postprocedural chest x-ray negative for pneumothorax PROCEDURE: An ultrasound guided thoracentesis was thoroughly discussed with the patient and questions answered. The  benefits, risks, alternatives and complications were also discussed. The patient understands and wishes to proceed with the procedure. Written consent was obtained. Ultrasound was performed to localize and mark an adequate pocket of fluid in the right chest. The area was then prepped and draped in the normal sterile fashion. 1% Lidocaine was used for local anesthesia. Under ultrasound guidance a Safe-T-Centesis catheter was introduced. Thoracentesis was performed. The catheter was removed and a dressing applied. FINDINGS: A total of approximately 2.0 L of clear yellow fluid was removed. Samples were sent to the laboratory as requested by the clinical team. IMPRESSION: Successful ultrasound guided right thoracentesis yielding 2.0 L of pleural fluid. Read by: Ascencion Dike PA-C Electronically Signed   By: Aletta Edouard M.D.   On: 02/21/2017 15:53   Medications: I have reviewed the patient's current medications. Scheduled Meds: . enoxaparin (LOVENOX) injection  40 mg Subcutaneous Q24H  . escitalopram  20 mg Oral Daily  . feeding supplement (ENSURE ENLIVE)  237 mL Oral BID BM  . ferrous sulfate  325 mg Oral BID WC  . furosemide  40 mg Oral Daily  . mouth rinse  15 mL Mouth Rinse BID  . pantoprazole  20 mg Oral QAC breakfast  . sodium chloride flush  3 mL Intravenous Q12H  . spironolactone  100 mg Oral Daily  . thiamine  100 mg Oral Daily   Continuous Infusions: PRN Meds:.ibuprofen, lidocaine (PF), loperamide, Melatonin, ondansetron **OR** ondansetron (ZOFRAN) IV Assessment/Plan: Principal Problem:   Hydrothorax Active Problems:   Alcoholic liver disease ([place])  Rhonda Haley is a 51yo F with a history of anemia, liver failure and anxiety who is here with R pleural effusion.   Hydrothorax Gram stain showed moderate WBCs but negative for organisms. Likely hepatic hydrothorax. Stable currently. Monitor. - Pleural fluid triglycerides, cytology and culture pending  Alcoholic liver  disease Physical exam positive for ascites, scleral icterus, hepatomegaly, LE 2+ pitting edema; negative for asterixis, spider angiomas. Elevated bili and AST. ALT and alk phos wnl. Liver MRI result above. Pt will need to follow up with [facility] at her appointment in August. - Echo ordered to evaluate heart fx - Repeat CMP/Pt/INR in AM - Continue furosemide 40 mg po daily and spironolactone 100 mg po daily - Continue thiamine 100 mg po daily - Consult [facility] (Dr. Carlean Purl)   Anemia Hg 9.1 > 8. Asymptomatic. - Continue ferrous sulfate 325 mg po bid  GERD - Continue Protonix 20 mg po daily  Anxiety - Continue escitalopram 20 mg po daily  DVT prophy: enoxaparin 40 mg daily  FEN: heart diet  Dispo: Expected hospital stay 1-2 nights.  This is a Careers information officer Note.  The care of the patient was discussed with Dr. Heber  and the assessment and plan formulated with their assistance.  Please see their attached note for official documentation of the daily encounter.   LOS: 1 day   Murlean Caller, Medical Student 02/22/2017, 1:20 PM  Pager: [phone]

## 2017-02-22 NOTE — Progress Notes (Signed)
Initial Nutrition Assessment  DOCUMENTATION CODES:   Severe malnutrition in context of chronic illness  INTERVENTION:   Ensure Enlive po BID, each supplement provides 350 kcal and 20 grams of protein  Discussed the importance of protein intake for preservation of lean body mass. Included protein supplement information in discharge instructions.   NUTRITION DIAGNOSIS:   Malnutrition (Severe ) related to chronic illness (alcoholic liver disease) as evidenced by severe depletion of body fat, severe depletion of muscle mass.  GOAL:   Patient will meet greater than or equal to 90% of their needs  MONITOR:   PO intake, Supplement acceptance, Weight trends  REASON FOR ASSESSMENT:   Malnutrition Screening Tool   ASSESSMENT:   Pt with PMH of alcoholic liver disease. Admitted for hydrothorax in the setting of liver disease. Had right thoracentesis, which removed 2L of yellowish fluid.   Pt reports consuming half of each meal during admission. States her appetite is increasing while at the hospital. Reports a decrease in appetite prior to admission for around 3 weeks, due to fear of intolerance/diarrhea. States her alcohol consumption stopped one month ago due to diagnosis of alcoholic liver disease. Discussed how alcohol can affect nutrition status.   Pt reports a UBW of 145 lbs. Weight records indicate a steady wt since June 2018 of 140-145 lb. Suspect pt has lost wt recently but hard to tell with fluctuating fluid accumulation.   Nutrition-Focused physical exam completed. Findings are severe fat depletion in arm region, severe muscle depletion in bilateral lower extremites, moderate edema in the abdomen, and bilateral lower extremities. Edema most likely due to alcoholic liver disease symptoms. Pt showed to have yellowing eyes.   Medications reviewed and include: ferrous sulfate, lasix, thiamine Labs reviewed: K 3.2 (L), AST 148 (H)  Diet Order:  Diet Heart Room service appropriate?  Yes; Fluid consistency: Thin; Fluid restriction: 1500 mL Fluid  Skin:  Reviewed, no issues  Last BM:  02/21/17  Height:   Ht Readings from Last 1 Encounters:  02/21/17 5' 8.5" (1.74 m)    Weight:   Wt Readings from Last 1 Encounters:  02/21/17 144 lb (65.3 kg)    Ideal Body Weight:  63.6 kg  BMI:  Body mass index is 21.58 kg/m.  Estimated Nutritional Needs:   Kcal:  1700-1900 (26-29 kcal/kg)  Protein:  95-105 grams (1.4-1.6 g/kg)  Fluid:  >1.7 L/day   EDUCATION NEEDS:   No education needs identified at this time  Rhonda Haley RD, LDN Pager # - [phone]

## 2017-02-23 ENCOUNTER — Inpatient Hospital Stay (HOSPITAL_COMMUNITY): Payer: 59

## 2017-02-23 DIAGNOSIS — R0600 Dyspnea, unspecified: Secondary | ICD-10-CM

## 2017-02-23 DIAGNOSIS — K7460 Unspecified cirrhosis of liver: Secondary | ICD-10-CM

## 2017-02-23 DIAGNOSIS — R188 Other ascites: Secondary | ICD-10-CM

## 2017-02-23 DIAGNOSIS — R17 Unspecified jaundice: Secondary | ICD-10-CM

## 2017-02-23 DIAGNOSIS — J948 Other specified pleural conditions: Principal | ICD-10-CM

## 2017-02-23 LAB — COMPREHENSIVE METABOLIC PANEL
ALT: 36 U/L (ref 14–54)
AST: 127 U/L — ABNORMAL HIGH (ref 15–41)
Albumin: 2.1 g/dL — ABNORMAL LOW (ref 3.5–5.0)
Alkaline Phosphatase: 94 U/L (ref 38–126)
Anion gap: 6 (ref 5–15)
BILIRUBIN TOTAL: 6.9 mg/dL — AB (ref 0.3–1.2)
BUN: 5 mg/dL — ABNORMAL LOW (ref 6–20)
CHLORIDE: 100 mmol/L — AB (ref 101–111)
CO2: 27 mmol/L (ref 22–32)
CREATININE: 0.51 mg/dL (ref 0.44–1.00)
Calcium: 8.1 mg/dL — ABNORMAL LOW (ref 8.9–10.3)
Glucose, Bld: 106 mg/dL — ABNORMAL HIGH (ref 65–99)
POTASSIUM: 3.4 mmol/L — AB (ref 3.5–5.1)
Sodium: 133 mmol/L — ABNORMAL LOW (ref 135–145)
TOTAL PROTEIN: 6.6 g/dL (ref 6.5–8.1)

## 2017-02-23 LAB — PROTIME-INR
INR: 1.68
PROTHROMBIN TIME: 20 s — AB (ref 11.4–15.2)

## 2017-02-23 LAB — ECHOCARDIOGRAM COMPLETE
HEIGHTINCHES: 68.5 in
Weight: 2304 oz

## 2017-02-23 MED ORDER — SPIRONOLACTONE 25 MG PO TABS
200.0000 mg | ORAL_TABLET | Freq: Every day | ORAL | Status: DC
  Administered 2017-02-24: 200 mg via ORAL
  Filled 2017-02-23: qty 8

## 2017-02-23 MED ORDER — SPIRONOLACTONE 25 MG PO TABS
100.0000 mg | ORAL_TABLET | Freq: Once | ORAL | Status: AC
  Administered 2017-02-23: 100 mg via ORAL
  Filled 2017-02-23: qty 4

## 2017-02-23 MED ORDER — FUROSEMIDE 40 MG PO TABS
40.0000 mg | ORAL_TABLET | Freq: Two times a day (BID) | ORAL | Status: DC
  Administered 2017-02-23 – 2017-02-24 (×2): 40 mg via ORAL
  Filled 2017-02-23 (×2): qty 1

## 2017-02-23 NOTE — Progress Notes (Signed)
*  PRELIMINARY RESULTS* Echocardiogram 2D Echocardiogram has been performed.  Jeryl Columbialliott, Makenzee Choudhry 02/23/2017, 12:07 PM

## 2017-02-23 NOTE — Progress Notes (Addendum)
Subjective: [name] was feeling well this morning. She has been intermittently on 4L O2 by nasal cannula. She states she has been able to get up and go to the bathroom without any oxygen and did not experience shortness of breath. She feels as though her swelling has gone down and state she went to the bathroom 7 times. She was informed of the plan for the day and agreed.  Objective:  Vital signs in last 24 hours: Vitals:   02/22/17 0605 02/22/17 1435 02/22/17 2120 02/23/17 0512  BP: (!) 101/57 (!) 102/59 113/63 (!) 111/58  Pulse: (!) 124 (!) 106 (!) 107 (!) 103  Resp: (!) 22 18 18 20   Temp: 98.2 F (36.8 C) 98.1 F (36.7 C) 98.2 F (36.8 C) 98.7 F (37.1 C)  TempSrc: Oral Oral Oral Oral  SpO2: 98% 95% 99% 100%  Weight:      Height:       Physical Exam  Constitutional: She is oriented to person, place, and time. She appears well-developed and well-nourished. No distress.  HENT:  Head: Normocephalic and atraumatic.  Eyes: EOM are normal. Scleral icterus is present.  Cardiovascular: Regular rhythm.  Exam reveals no gallop and no friction rub.   No murmur heard. Tachcardic  Pulmonary/Chest: Effort normal. No respiratory distress.  - R Basilar: No breath sound auscultated - L lung and Bilat Apices: Clear  Musculoskeletal: She exhibits no deformity.  1+ Pitting Edema  Neurological: She is alert and oriented to person, place, and time.  Skin: Skin is warm and dry.  2-3822m erythematous macules over bilat LE    MRI Liver W WO Contrast: IMPRESSION: 1. Exam detail is diminished secondary to motion artifact. Patient felt short of breath which was worsened by anxiety. 2. Morphologic features of liver compatible with cirrhosis. There is stigmata of portal venous hypertension including splenomegaly, varicosities and abdominal ascites. 3. Arterial phase enhancing structure within the left lobe of liver measures less than 1 cm and is considered a LR-3, which is indeterminate. Followup  imaging in 6 months with repeat liver protocol MRI is advised. 4. Indeterminate enhancing lesion arising from posterior cortex of right kidney is identified. Difficult to assess due to motion artifact. Cannot rule out solid enhancing neoplasm such as renal cell carcinoma. Consider further evaluation with renal protocol CT which may be less susceptible to respiratory motion artifact. 5. Right pleural effusion. 6. Gallstones.  Assessment/Plan:  Hydrothorax: In the setting of known liver disease. Differential includes Hepatic Hydrothorax (supported by R sided presentation, Hx of liver disease, transudative nature of the fluid, and coexisting stigmata including physical exam consistent with ascites, scleral icterus, and increased liver span); L Heart Failure (less likely due to unilateral presentation, and no prior history of heart disease) She received thoracentesis yielding 2L of clear yellow fluid. - IR Thoracentesis: 2L, sent for TGs,Cytology             - Albumin: <1.0 (Serum 2.6)             - Protein: <3.0 (Serum 8.2)             - Glucose: 121 (Serum 111)  - Cell Count: WNL, Hazy yellow fluid  - Gram stain: Negative  - Culture: Pending  - TGs 29  - TTE to rule out cardiac component  Alcoholic Liver Cirrhosis: With worsening ascites, edema, elevated T.Bili (6.9) with scleral icterus, and possible hepatic hydrothorax. 2016 MELD: 21 at admission. She quit alcohol use 1 month ago. MRI  Liver was complicated by motion artifact, but showed features compatible with cirrhosis, lesion on liver with follow up of 6 moths recommended, and gall stones. - Consult GI - Lasix 40mg  Daily - Spironolactone 100mg  Daily - Discriminate function score: 29  - Daily CMP  - Daily PT/INR  - Continue Thiamine - Discontinue oxygen - Discontinue cardiac monitoring - Ambulate with pulse ox  Anemia: - Ferrous Sulfate 325mg  Daily  GERD: - Protonix 20mg  Daily  Anxiety: - Continue home Lexipro  Hypokalemia  (3.4 today) - Monitor, replete as needed  F/E/N: Heart Diet DVT Phrophylaxis: Lovenox   Dispo: Anticipated discharge in approximately 0-1 day(s).  Beola Cord, DO IM PGY-1 Pager: [phone]

## 2017-02-23 NOTE — Progress Notes (Signed)
Subjective: [name] is doing well this AM. She is pleasant and cooperative, and expresses desire to return home soon. No events overnight. She denies SOB or chest pain. She does endorse a productive cough today, but thinks it is still much improved from admission. She urinated 7x yesterday and feels that her LE swelling has greatly improved.  Objective: Vital signs in last 24 hours: Vitals:   02/22/17 0605 02/22/17 1435 02/22/17 2120 02/23/17 0512  BP: (!) 101/57 (!) 102/59 113/63 (!) 111/58  Pulse: (!) 124 (!) 106 (!) 107 (!) 103  Resp: (!) 22 18 18 20   Temp: 98.2 F (36.8 C) 98.1 F (36.7 C) 98.2 F (36.8 C) 98.7 F (37.1 C)  TempSrc: Oral Oral Oral Oral  SpO2: 98% 95% 99% 100%  Weight:      Height:        BP (!) 111/58 (BP Location: Right Arm)   Pulse (!) 103   Temp 98.7 F (37.1 C) (Oral)   Resp 20   Ht 5' 8.5" (1.74 m)   Wt 65.3 kg (144 lb)   SpO2 100%   BMI 21.58 kg/m  General appearance: alert, cooperative and appears stated age Eyes: negative findings: corneas clear and pupils equal, round, reactive to light and accomodation, positive findings: sclera icterus Lungs: Crackles on L side and basilar R side. Absent breath sounds throughout R side. Heart: tachycardic, regular rhythm, S1, S2 normal, no murmur, click, rub or gallop Abdomen: distended, softer compared to yesterday's exam, non-tender, hepatomegaly Neurologic: Grossly normal Lab Results: PT/INR 20/1.68 Na 133 K 3.4 Cl 100 CO2 27 Glu 106 BUN 5 Cr 0.51 Albumin 2.1 AST 127 Bili 6.9 Micro Results: Recent Results (from the past 240 hour(s))  Gram stain     Status: None   Collection Time: 02/21/17  3:11 PM  Result Value Ref Range Status   Specimen Description FLUID PLEURAL  Final   Special Requests NONE  Final   Gram Stain   Final    MODERATE WBC PRESENT, PREDOMINANTLY MONONUCLEAR NO ORGANISMS SEEN    Report Status 02/21/2017 FINAL  Final  Culture, body fluid-bottle     Status: None (Preliminary  result)   Collection Time: 02/21/17  3:11 PM  Result Value Ref Range Status   Specimen Description FLUID PLEURAL  Final   Special Requests BOTTLES DRAWN AEROBIC AND ANAEROBIC 10CC  Final   Culture NO GROWTH 2 DAYS  Final   Report Status PENDING  Incomplete   Studies/Results: Dg Chest 1 View  Result Date: 02/21/2017 CLINICAL DATA:  51 year old female status post ultrasound-guided right lung thoracentesis today. EXAM: CHEST 1 VIEW COMPARISON:  PA and lateral chest radiographs 1042 hours today. FINDINGS: PA view of the chest at 1519 hours. Significantly reduced volume of right pleural fluid with improved right mid and upper lung aeration. Up to moderate residual right pleural effusion. No pneumothorax. Right infrahilar air bronchograms. Possible right hilar contour enlargement. Other mediastinal contours are within normal limits. Visualized tracheal air column is within normal limits. Stable and negative left lung. No acute osseous abnormality identified. IMPRESSION: 1. Status post right thoracentesis with reduced right pleural effusion and no pneumothorax. Moderate residual with right lung base atelectasis or consolidation. 2. Questionable abnormal right hilar contour. If the pleural fluid analysis is unrevealing then recommend follow-up Chest CT (IV contrast preferred). Electronically Signed   By: Odessa FlemingH  Hall M.D.   On: 02/21/2017 15:37   Mr Liver W ONWo Contrast  Result Date: 02/22/2017 CLINICAL DATA:  Cirrhosis.  Shortness of breath for 2-3 days. EXAM: MRI ABDOMEN WITHOUT AND WITH CONTRAST TECHNIQUE: Multiplanar multisequence MR imaging of the abdomen was performed both before and after the administration of intravenous contrast. CONTRAST:  13mL MULTIHANCE GADOBENATE DIMEGLUMINE 529 MG/ML IV SOLN COMPARISON:  None. FINDINGS: Significantly diminished exam detail secondary to motion artifact. Patient felt short of breath which was worsened by anxiety. Lower chest: Large right pleural effusion is again noted.  Hepatobiliary: Hypertrophy of the lateral segment of left lobe of liver identified. There is a macronodular contour of the liver compatible with cirrhosis. 6 mm arterial phase enhancing structure within the lateral segment of left lobe of liver is identified, image 47 of series 1101 no no definite washout or capsule identified. Findings compatible with LR-3 lesion. Small cyst in the medial segment of left lobe of liver measures 5 mm. There is diffuse edema involving the wall of the gallbladder likely secondary to cirrhosis and portal venous hypertension. Possible tiny stones noted within the dependent portion of the gallbladder, image 29 of series 5 Pancreas: No mass, inflammatory changes, or other parenchymal abnormality identified. Spleen:  The spleen is enlarged measuring 15.7 cm Adrenals/Urinary Tract: Normal appearance of the adrenal glands. Arising from the posterior aspect of the right kidney there is a 2.3 cm exophytic structure which is T2 hypointense in T1 isointense. Avid enhancement following the IV administration of contrast material is noted. Unremarkable appearance of the left kidney. No hydronephrosis identified. Stomach/Bowel: The stomach appears normal. There is mild diffuse edema involving the central small bowel loops. No abnormal bowel dilatation noted. Vascular/Lymphatic: No abdominal aortic aneurysm. Small upper abdominal varicosities are identified. No adenopathy noted within the upper abdomen. Other: There is mild upper abdominal ascites. Diffuse body wall edema is noted. Musculoskeletal: No abnormal signal or enhancement identified from within the bone marrow. IMPRESSION: 1. Exam detail is diminished secondary to motion artifact. Patient felt short of breath which was worsened by anxiety. 2. Morphologic features of liver compatible with cirrhosis. There is stigmata of portal venous hypertension including splenomegaly, varicosities and abdominal ascites. 3. Arterial phase enhancing structure  within the left lobe of liver measures less than 1 cm and is considered a LR-3, which is indeterminate. Followup imaging in 6 months with repeat liver protocol MRI is advised. 4. Indeterminate enhancing lesion arising from posterior cortex of right kidney is identified. Difficult to assess due to motion artifact. Cannot rule out solid enhancing neoplasm such as renal cell carcinoma. Consider further evaluation with renal protocol CT which may be less susceptible to respiratory motion artifact. 5. Right pleural effusion. 6. Gallstones. Electronically Signed   By: Signa Kell M.D.   On: 02/22/2017 09:49   Ir Thoracentesis Asp Pleural Space W/img Guide  Result Date: 02/21/2017 INDICATION: Shortness of breath, right-sided pleural effusion. Request diagnostic and therapeutic thoracentesis. EXAM: ULTRASOUND GUIDED RIGHT THORACENTESIS MEDICATIONS: None. COMPLICATIONS: None immediate. Postprocedural chest x-ray negative for pneumothorax PROCEDURE: An ultrasound guided thoracentesis was thoroughly discussed with the patient and questions answered. The benefits, risks, alternatives and complications were also discussed. The patient understands and wishes to proceed with the procedure. Written consent was obtained. Ultrasound was performed to localize and mark an adequate pocket of fluid in the right chest. The area was then prepped and draped in the normal sterile fashion. 1% Lidocaine was used for local anesthesia. Under ultrasound guidance a Safe-T-Centesis catheter was introduced. Thoracentesis was performed. The catheter was removed and a dressing applied. FINDINGS: A total of approximately 2.0 L of clear yellow  fluid was removed. Samples were sent to the laboratory as requested by the clinical team. IMPRESSION: Successful ultrasound guided right thoracentesis yielding 2.0 L of pleural fluid. Read by: Brayton El PA-C Electronically Signed   By: Irish Lack M.D.   On: 02/21/2017 15:53   Medications: I have  reviewed the patient's current medications. Scheduled Meds: . enoxaparin (LOVENOX) injection  40 mg Subcutaneous Q24H  . escitalopram  20 mg Oral Daily  . feeding supplement (ENSURE ENLIVE)  237 mL Oral BID BM  . ferrous sulfate  325 mg Oral BID WC  . furosemide  40 mg Oral Daily  . mouth rinse  15 mL Mouth Rinse BID  . pantoprazole  20 mg Oral QAC breakfast  . sodium chloride flush  3 mL Intravenous Q12H  . spironolactone  100 mg Oral Daily  . thiamine  100 mg Oral Daily   Continuous Infusions: PRN Meds:.ibuprofen, lidocaine (PF), loperamide, Melatonin, ondansetron **OR** ondansetron (ZOFRAN) IV Assessment/Plan: Principal Problem:   Hydrothorax Active Problems:   Normocytic anemia, not due to blood loss   Alcoholic cirrhosis of liver with ascites (HCC)  [name] is a 51yo F with a history of anemia, liver failure and anxiety who is here with R pleural effusion.   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 Sx stable currently.  - Pleural fluid triglycerides, cytology and culture pending - Walk w/ pulse ox on room air  Alcoholic liver disease On physical exam today, abdomen was less distended. Trace LE edema; much improved from admission. CMP this morning remains stable. AST is 127 down from 148. Bili is 6.9 up from 6.6. - Echo this AM - Continue trending CMP/PT/INR  - Continue furosemide 40 mg po daily and spironolactone 100 mg po daily - Continue thiamine 100 mg po daily - Consult [facility] (Dr. Leone Payor)   Anemia - Continue ferrous sulfate 325 mg po bid  GERD - Continue Protonix 20 mg po daily  Anxiety - Continue escitalopram 20 mg po daily  DVT prophy: enoxaparin 40 mg daily  FEN: heart diet  Dispo: Discharge expected today or tomorrow.  This is a Psychologist, occupational Note.  The care of the patient was discussed with Dr. Mikey Bussing and the assessment and plan formulated with their assistance.  Please see their attached note for official documentation of the  daily encounter.   LOS: 2 days   Donnalee Curry, Medical Student 02/23/2017, 11:36 AM  Pager: [phone]

## 2017-02-23 NOTE — Consult Note (Signed)
Consultation  Referring Provider:     Carlynn PurlErik Hoffman Primary Care Physician:  Lewis Moccasinewey, Elizabeth R, MD Primary Gastroenterologist:     Stan Headarl Gessner    Reason for Consultation:     Cirrhosis, hepatic hydrothorax         HPI:   Rhonda Haley is a 51 y.o. 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 She reports years of "heavy drinking" followed by years or more moderate drinking, at least drinks per day. Her last drink was 5 weeks ago following her hospitalization when she was diagnosed. She states she has been abstinent since that time.   She was admitted with shortness of breath and found to have a large right sided pleural effusion. 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 She had an MRI of the liver on 7/18 showing cirrhotic liver with ascites and 6mm lesion for which monitoring with MRI is recommended in 6 months. She also has gallstones. She has jaundice with bilurbinemia. No varices on recent EGD. She endorses lower extremity edema and abdominal distension, both of these are improving on lasix 40mg  q day and aldactone 100mg  daily which she recently started. MELD is 20 today.   Overall she reports feeling better since thoracentesis. No longer on oxygen at rest but with ambulation she desaturated to 89-91% today without oxygen. We were consulted to assist with management. Generally she feels improved since being admitted, eating okay.   EGD 02/01/17 - LA grade A esophagitis, no varices Colonoscopy 6/27 - normal, [facility]   Past Medical History:  Diagnosis Date  . Alcoholic liver disease (HCC) 01/19/2017  . Allergy   . Anxiety   . Blood transfusion without reported diagnosis   . Symptomatic anemia 01/19/2017    Past Surgical History:  Procedure Laterality Date  . FERTILITY SURGERY  2003   in vitro fertilization  . IR  THORACENTESIS ASP PLEURAL SPACE W/IMG GUIDE  02/21/2017  . WISDOM TOOTH EXTRACTION     age 51    Family History  Problem Relation Age of Onset  . Brain cancer Mother   . Colon cancer Neg Hx   . Esophageal cancer Neg Hx   . Rectal cancer Neg Hx   . Stomach cancer Neg Hx      Social History  Substance Use Topics  . Smoking status: Current Every Day Smoker    Packs/day: 0.50    Years: 28.00    Types: Cigarettes  . Smokeless tobacco: Never Used  . Alcohol use No     Comment: quit    Prior to Admission medications   Medication Sig Start Date End Date Taking? Authorizing Provider  escitalopram (LEXAPRO) 20 MG tablet Take 20 mg by mouth daily. 01/18/17  Yes [provider]  feeding supplement, ENSURE ENLIVE, (ENSURE ENLIVE) LIQD Take 237 mLs by mouth 2 (two) times daily between meals. Patient taking differently: Take 237 mLs by mouth daily.  01/21/17  Yes Ghimire, Werner LeanShanker M, MD  ferrous sulfate 325 (65 FE) MG tablet Take 1 tablet (325 mg total) by mouth 2 (two) times daily with a meal. 01/20/17  Yes Ghimire, Werner LeanShanker M, MD  loperamide (IMODIUM A-D) 2 MG tablet Take 1 tablet (2 mg total) by mouth 3 (three) times daily as needed for diarrhea or loose stools. 01/20/17  Yes Ghimire, Werner LeanShanker M, MD  loratadine (  CLARITIN) 10 MG tablet Take 10 mg by mouth daily as needed for allergies.   Yes [provider]  MELATONIN ER PO Take 1 capsule by mouth as needed (for sleep).    Yes [provider]  pantoprazole (PROTONIX) 20 MG tablet Take 1 tablet (20 mg total) by mouth daily before breakfast. 02/01/17  Yes Iva Boop, MD  thiamine (VITAMIN B-1) 100 MG tablet Take 1 tablet (100 mg total) by mouth daily. 01/20/17  Yes Ghimire, Werner Lean, MD    Current Facility-Administered Medications  Medication Dose Route Frequency Provider Last Rate Last Dose  . enoxaparin (LOVENOX) injection 40 mg  40 mg Subcutaneous Q24H Fuller Plan, MD   40 mg at 02/22/17 2125  .  escitalopram (LEXAPRO) tablet 20 mg  20 mg Oral Daily Fuller Plan, MD   20 mg at 02/23/17 1031  . feeding supplement (ENSURE ENLIVE) (ENSURE ENLIVE) liquid 237 mL  237 mL Oral BID BM Fuller Plan, MD   237 mL at 02/23/17 1035  . ferrous sulfate tablet 325 mg  325 mg Oral BID WC Rice, Jamesetta Orleans, MD   325 mg at 02/23/17 1030  . furosemide (LASIX) tablet 40 mg  40 mg Oral Daily Fuller Plan, MD   40 mg at 02/23/17 1031  . lidocaine (PF) (XYLOCAINE) 1 % injection    PRN Irish Lack, MD   5 mL at 02/21/17 1511  . loperamide (IMODIUM) capsule 2 mg  2 mg Oral TID PRN Fuller Plan, MD      . MEDLINE mouth rinse  15 mL Mouth Rinse BID Carlynn Purl C, DO   15 mL at 02/23/17 1045  . Melatonin TABS 3 mg  3 mg Oral QHS PRN Carlynn Purl C, DO   3 mg at 02/22/17 2123  . ondansetron (ZOFRAN) tablet 4 mg  4 mg Oral Q6H PRN Rice, Jamesetta Orleans, MD       Or  . ondansetron [facility]) injection 4 mg  4 mg Intravenous Q6H PRN Rice, Jamesetta Orleans, MD      . pantoprazole (PROTONIX) EC tablet 20 mg  20 mg Oral QAC breakfast Fuller Plan, MD   20 mg at 02/23/17 1030  . sodium chloride flush (NS) 0.9 % injection 3 mL  3 mL Intravenous Q12H Rice, Jamesetta Orleans, MD   3 mL at 02/22/17 0909  . spironolactone (ALDACTONE) tablet 100 mg  100 mg Oral Daily Fuller Plan, MD   100 mg at 02/23/17 1031  . thiamine (VITAMIN B-1) tablet 100 mg  100 mg Oral Daily Fuller Plan, MD   100 mg at 02/23/17 1034    Allergies as of 02/21/2017  . (No Known Allergies)     Review of Systems:    As per HPI, otherwise negative    Physical Exam:  Vital signs in last 24 hours: Temp:  [98.1 F (36.7 C)-98.7 F (37.1 C)] 98.7 F (37.1 C) (07/19 0512) Pulse Rate:  [103-107] 103 (07/19 0512) Resp:  [18-20] 20 (07/19 0512) BP: (102-113)/(58-63) 111/58 (07/19 0512) SpO2:  [95 %-100 %] 100 % (07/19 0512) Last BM Date: 02/22/17 General:   Pleasant female in NAD, jaundiced Head:   Normocephalic and atraumatic.spider angioma noted Eyes:   (+) icterus.   Conjunctiva pink. Ears:  Normal auditory acuity. Neck:  Supple Lungs:  Respirations even and unlabored. Lungs clear to auscultation on left side, decreased on R base / mid lung.     Heart:  Mild tachy, regular rhythm; no MRG Abdomen:  Soft, mildly distended,.No appreciable masses  Rectal:  Not performed.  Msk:  Symmetrical without gross deformities.  Extremities:  (+) 1-2 edema of LE Bilaterally. Neurologic:  Alert and  oriented x4;  grossly normal neurologically. No asterixis Skin:  Intact without significant lesions or rashes. Psych:  Alert and cooperative. Normal affect.  LAB RESULTS:  Recent Labs  02/21/17 1123 02/22/17 0414  WBC 10.0 9.5  HGB 9.1* 8.0*  HCT 28.7* 25.3*  PLT 170 142*   BMET  Recent Labs  02/21/17 1123 02/22/17 0414 02/23/17 0455  NA 134* 135 133*  K 4.0 3.2* 3.4*  CL 103 102 100*  CO2 23 26 27   GLUCOSE 111* 89 106*  BUN 5* <5* 5*  CREATININE 0.36* 0.48 0.51  CALCIUM 8.7* 8.1* 8.1*   LFT  Recent Labs  02/23/17 0455  PROT 6.6  ALBUMIN 2.1*  AST 127*  ALT 36  ALKPHOS 94  BILITOT 6.9*   PT/INR  Recent Labs  02/23/17 0326 02/23/17 0455  LABPROT >90.0* 20.0*  INR >10.00* 1.68    STUDIES: Dg Chest 1 View  Result Date: 02/21/2017 CLINICAL DATA:  51 year old female status post ultrasound-guided right lung thoracentesis today. EXAM: CHEST 1 VIEW COMPARISON:  PA and lateral chest radiographs 1042 hours today. FINDINGS: PA view of the chest at 1519 hours. Significantly reduced volume of right pleural fluid with improved right mid and upper lung aeration. Up to moderate residual right pleural effusion. No pneumothorax. Right infrahilar air bronchograms. Possible right hilar contour enlargement. Other mediastinal contours are within normal limits. Visualized tracheal air column is within normal limits. Stable and negative left lung. No acute osseous abnormality identified.  IMPRESSION: 1. Status post right thoracentesis with reduced right pleural effusion and no pneumothorax. Moderate residual with right lung base atelectasis or consolidation. 2. Questionable abnormal right hilar contour. If the pleural fluid analysis is unrevealing then recommend follow-up Chest CT (IV contrast preferred). Electronically Signed   By: Odessa Fleming M.D.   On: 02/21/2017 15:37   Mr Liver W ZO Contrast  Result Date: 02/22/2017 CLINICAL DATA:  Cirrhosis.  Shortness of breath for 2-3 days. EXAM: MRI ABDOMEN WITHOUT AND WITH CONTRAST TECHNIQUE: Multiplanar multisequence MR imaging of the abdomen was performed both before and after the administration of intravenous contrast. CONTRAST:  13mL MULTIHANCE GADOBENATE DIMEGLUMINE 529 MG/ML IV SOLN COMPARISON:  None. FINDINGS: Significantly diminished exam detail secondary to motion artifact. Patient felt short of breath which was worsened by anxiety. Lower chest: Large right pleural effusion is again noted. Hepatobiliary: Hypertrophy of the lateral segment of left lobe of liver identified. There is a macronodular contour of the liver compatible with cirrhosis. 6 mm arterial phase enhancing structure within the lateral segment of left lobe of liver is identified, image 47 of series 1101 no no definite washout or capsule identified. Findings compatible with LR-3 lesion. Small cyst in the medial segment of left lobe of liver measures 5 mm. There is diffuse edema involving the wall of the gallbladder likely secondary to cirrhosis and portal venous hypertension. Possible tiny stones noted within the dependent portion of the gallbladder, image 29 of series 5 Pancreas: No mass, inflammatory changes, or other parenchymal abnormality identified. Spleen:  The spleen is enlarged measuring 15.7 cm Adrenals/Urinary Tract: Normal appearance of the adrenal glands. Arising from the posterior aspect of the right kidney there is a 2.3 cm exophytic structure which is T2 hypointense in  T1 isointense. Avid enhancement following  the IV administration of contrast material is noted. Unremarkable appearance of the left kidney. No hydronephrosis identified. Stomach/Bowel: The stomach appears normal. There is mild diffuse edema involving the central small bowel loops. No abnormal bowel dilatation noted. Vascular/Lymphatic: No abdominal aortic aneurysm. Small upper abdominal varicosities are identified. No adenopathy noted within the upper abdomen. Other: There is mild upper abdominal ascites. Diffuse body wall edema is noted. Musculoskeletal: No abnormal signal or enhancement identified from within the bone marrow. IMPRESSION: 1. Exam detail is diminished secondary to motion artifact. Patient felt short of breath which was worsened by anxiety. 2. Morphologic features of liver compatible with cirrhosis. There is stigmata of portal venous hypertension including splenomegaly, varicosities and abdominal ascites. 3. Arterial phase enhancing structure within the left lobe of liver measures less than 1 cm and is considered a LR-3, which is indeterminate. Followup imaging in 6 months with repeat liver protocol MRI is advised. 4. Indeterminate enhancing lesion arising from posterior cortex of right kidney is identified. Difficult to assess due to motion artifact. Cannot rule out solid enhancing neoplasm such as renal cell carcinoma. Consider further evaluation with renal protocol CT which may be less susceptible to respiratory motion artifact. 5. Right pleural effusion. 6. Gallstones. Electronically Signed   By: Signa Kell M.D.   On: 02/22/2017 09:49   Ir Thoracentesis Asp Pleural Space W/img Guide  Result Date: 02/21/2017 INDICATION: Shortness of breath, right-sided pleural effusion. Request diagnostic and therapeutic thoracentesis. EXAM: ULTRASOUND GUIDED RIGHT THORACENTESIS MEDICATIONS: None. COMPLICATIONS: None immediate. Postprocedural chest x-ray negative for pneumothorax PROCEDURE: An ultrasound  guided thoracentesis was thoroughly discussed with the patient and questions answered. The benefits, risks, alternatives and complications were also discussed. The patient understands and wishes to proceed with the procedure. Written consent was obtained. Ultrasound was performed to localize and mark an adequate pocket of fluid in the right chest. The area was then prepped and draped in the normal sterile fashion. 1% Lidocaine was used for local anesthesia. Under ultrasound guidance a Safe-T-Centesis catheter was introduced. Thoracentesis was performed. The catheter was removed and a dressing applied. FINDINGS: A total of approximately 2.0 L of clear yellow fluid was removed. Samples were sent to the laboratory as requested by the clinical team. IMPRESSION: Successful ultrasound guided right thoracentesis yielding 2.0 L of pleural fluid. Read by: Brayton El PA-C Electronically Signed   By: Irish Lack M.D.   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 MELD 20. 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 We discussed initial management of this is diuretics which she has tolerated well and appears to be working. I would escalate diuretics as tolerated, especially given her desaturations with ambulation.   We otherwise discussed cirrhosis in general, long term risks. While I do think she has alcoholic cirrhosis, I would check some other serologies to ensure no evidence of other chronic liver diseases given her age. MRI shows an indeterminate liver lesion, no dilated ducts, I think her jaundice is from decompensated cirrhosis, but AFP level should be obtained. Varices screening up to date.  Recommend the following: -  continued alcohol abstinence - low Na (<2gm / day) diet - agree with echocardiogram,  will await findings - hopefully discharge soon when she does not need oxygen with ambulation. I would escalate diuretics as tolerated in this light, lasix to 40mg  BID and aldactone to 200mg  to more aggressively treat effusion. Monitor renal function and electrolytes closely on this regimen, she should have them done in 2-3 days after discharge - NO NSAIDs, I have stopped ibuprofen, given risk for renal injury - obtain baseline labs as outlined - ANA, SMA, IgG, ceruloplasmin, alpha one antitrypsin, hepatitis A total AB, hepatitis B surface AB (vaccinate if needed). AFP level given MRI findings - f/u MRI in 6 months as outpatient  Once ready for discharge, needs close follow up with labs and will coordinate office visit with Dr. Leone Payor.   Call with questions, thanks  Ileene Patrick, MD [facility] Pager [phone]

## 2017-02-23 NOTE — Progress Notes (Signed)
Notified MD of critical PT/INR as well as notification from lab to nurse of contaminated labs. MD to call lab and notify nurse for any new orders.

## 2017-02-23 NOTE — Progress Notes (Signed)
SATURATION QUALIFICATIONS: (This note is used to comply with regulatory documentation for home oxygen)  Patient Saturations on Room Air at Rest = 98%  Patient Saturations on Room Air while Ambulating = 89-91%

## 2017-02-24 ENCOUNTER — Other Ambulatory Visit: Payer: Self-pay

## 2017-02-24 ENCOUNTER — Telehealth: Payer: Self-pay

## 2017-02-24 DIAGNOSIS — K8020 Calculus of gallbladder without cholecystitis without obstruction: Secondary | ICD-10-CM | POA: Diagnosis present

## 2017-02-24 DIAGNOSIS — R932 Abnormal findings on diagnostic imaging of liver and biliary tract: Secondary | ICD-10-CM | POA: Diagnosis not present

## 2017-02-24 DIAGNOSIS — J948 Other specified pleural conditions: Secondary | ICD-10-CM

## 2017-02-24 LAB — COMPREHENSIVE METABOLIC PANEL
ALK PHOS: 96 U/L (ref 38–126)
ALT: 35 U/L (ref 14–54)
ANION GAP: 8 (ref 5–15)
AST: 104 U/L — ABNORMAL HIGH (ref 15–41)
Albumin: 2 g/dL — ABNORMAL LOW (ref 3.5–5.0)
BILIRUBIN TOTAL: 6.6 mg/dL — AB (ref 0.3–1.2)
BUN: 5 mg/dL — ABNORMAL LOW (ref 6–20)
CALCIUM: 8.2 mg/dL — AB (ref 8.9–10.3)
CO2: 29 mmol/L (ref 22–32)
CREATININE: 0.47 mg/dL (ref 0.44–1.00)
Chloride: 98 mmol/L — ABNORMAL LOW (ref 101–111)
GFR calc non Af Amer: >60 mL/min (ref >60)
GLUCOSE: 103 mg/dL — AB (ref 65–99)
Potassium: 3.3 mmol/L — ABNORMAL LOW (ref 3.5–5.1)
Sodium: 135 mmol/L (ref 135–145)
TOTAL PROTEIN: 6.8 g/dL (ref 6.5–8.1)

## 2017-02-24 LAB — IGG: IGG (IMMUNOGLOBIN G), SERUM: 1948 mg/dL — AB (ref 700–1600)

## 2017-02-24 LAB — PROTIME-INR
INR: 1.59
Prothrombin Time: 19.1 seconds — ABNORMAL HIGH (ref 11.4–15.2)

## 2017-02-24 LAB — ALPHA-1-ANTITRYPSIN: A-1 Antitrypsin, Ser: 182 mg/dL (ref 90–200)

## 2017-02-24 LAB — AFP TUMOR MARKER: AFP, Serum, Tumor Marker: 3.5 ng/mL (ref 0.0–8.3)

## 2017-02-24 LAB — HEPATITIS B SURFACE ANTIBODY,QUALITATIVE: HEP B S AB: NONREACTIVE

## 2017-02-24 LAB — CERULOPLASMIN: CERULOPLASMIN: 23.5 mg/dL (ref 19.0–39.0)

## 2017-02-24 LAB — HEPATITIS A ANTIBODY, TOTAL: Hep A Total Ab: POSITIVE — AB

## 2017-02-24 MED ORDER — FUROSEMIDE 40 MG PO TABS
40.0000 mg | ORAL_TABLET | Freq: Two times a day (BID) | ORAL | 0 refills | Status: DC
Start: 2017-02-24 — End: 2017-03-21

## 2017-02-24 MED ORDER — POTASSIUM CHLORIDE CRYS ER 20 MEQ PO TBCR: 20.0000 meq | EXTENDED_RELEASE_TABLET | Freq: Every day | ORAL | 0 refills | Status: AC

## 2017-02-24 MED ORDER — SPIRONOLACTONE 100 MG PO TABS
200.0000 mg | ORAL_TABLET | Freq: Every day | ORAL | 0 refills | Status: DC
Start: 2017-02-25 — End: 2017-03-17

## 2017-02-24 NOTE — Progress Notes (Signed)
Subjective: Rhonda Haley is doing well today. She denies dyspnea and SOB. She successfully ambulated yesterday off oxygen and only desaturated to 89-91%. She is currently on room air and reports feeling ready to go home. Patient understands that she needs to follow up with [facility] and her PCP. She was instructed to inform her doctors if she has symptoms of hypokalemia or excessive diuresis.  Objective: Vital signs in last 24 hours: Vitals:   02/23/17 1504 02/23/17 2212 02/24/17 0543 02/24/17 0828  BP: (!) 104/52 (!) 113/50 (!) 108/53 (!) 102/55  Pulse: (!) 120 (!) 111 (!) 102 100  Resp: 18 18 18    Temp: 99 F (37.2 C) 99 F (37.2 C) 98.4 F (36.9 C)   TempSrc:  Oral    SpO2: 99% 95% 100%   Weight:      Height:       No intake or output data in the 24 hours ending 02/24/17 1110 BP (!) 102/55 (BP Location: Right Arm)   Pulse 100   Temp 98.4 F (36.9 C)   Resp 18   Ht 5' 8.5" (1.74 m)   Wt 65.3 kg (144 lb)   SpO2 100%   BMI 21.58 kg/m  General appearance: alert, cooperative and appears stated age Eyes: negative findings: corneas clear and pupils equal, round, reactive to light and accomodation, positive findings: sclera icterus Lungs: Normal effort and no respiratory distress. No breath sounds heart R basilar. Clear L lung and bilat apices. Heart: regular rate and rhythm, S1, S2 normal, no murmur, click, rub or gallop Extremities: trace LE pitting edema Neurologic: Grossly normal Lab Results: Na 135 K 3.3 Cl 98 CO2 29 Glu 103 Bun 5 Cr 0.47 Ca 8.2 Albumin 2.0 AST/ALT 104/35 Bili 6.6 Micro Results: Recent Results (from the past 240 hour(s))  Gram stain     Status: None   Collection Time: 02/21/17  3:11 PM  Result Value Ref Range Status   Specimen Description FLUID PLEURAL  Final   Special Requests NONE  Final   Gram Stain   Final    MODERATE WBC PRESENT, PREDOMINANTLY MONONUCLEAR NO ORGANISMS SEEN    Report Status 02/21/2017 FINAL  Final  Culture, body fluid-bottle      Status: None (Preliminary result)   Collection Time: 02/21/17  3:11 PM  Result Value Ref Range Status   Specimen Description FLUID PLEURAL  Final   Special Requests BOTTLES DRAWN AEROBIC AND ANAEROBIC 10CC  Final   Culture NO GROWTH 3 DAYS  Final   Report Status PENDING  Incomplete   Medications: I have reviewed the patient's current medications. Scheduled Meds: . enoxaparin (LOVENOX) injection  40 mg Subcutaneous Q24H  . escitalopram  20 mg Oral Daily  . feeding supplement (ENSURE ENLIVE)  237 mL Oral BID BM  . ferrous sulfate  325 mg Oral BID WC  . furosemide  40 mg Oral BID  . mouth rinse  15 mL Mouth Rinse BID  . pantoprazole  20 mg Oral QAC breakfast  . sodium chloride flush  3 mL Intravenous Q12H  . spironolactone  200 mg Oral Daily  . thiamine  100 mg Oral Daily   Continuous Infusions: PRN Meds:.lidocaine (PF), loperamide, Melatonin, ondansetron **OR** ondansetron (ZOFRAN) IV Assessment/Plan: Principal Problem:   Hydrothorax Active Problems:   Normocytic anemia, not due to blood loss   Alcoholic cirrhosis of liver with ascites (HCC)  [name] Iman is a 51yo F with a history of anemia, liver failure and anxiety who is  here with R pleural effusion.   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 Sx stable currently.  - IR Thoracentesis: 2L, sent for TGs,Cytology - Albumin: <1.0 (Serum 2.6) - Protein: <3.0 (Serum 8.2) - Glucose: 121 (Serum 111)             - Cell Count: WNL, Hazy yellow fluid             - Gram stain: Negative             - Culture: Pending             - TGs 29            Alcoholic liver disease Trace LE edema; much improved from admission. CMP, PT/INR  this morning remain stable. AST is 104 down from 127. Bili is 6.6 up from 6.9. - Echo unremarkable, EF 60-65% - Continue furosemide 40 mg po daily and spironolactone 100 mg po daily - Continue thiamine 100 mg po daily - Follow up with [facility] (Dr. Leone Payor)    Anemia - Continue ferrous sulfate 325 mg po bid  GERD - Continue Protonix 20 mg po daily  Anxiety - Continue escitalopram 20 mg po daily  Hypokalemia - 20 mEq daily  DVT prophy: enoxaparin 40 mg daily  FEN: heart diet  Dispo: Discharge today.  This is a Psychologist, occupational Note.  The care of the patient was discussed with Dr. Mikey Bussing and the assessment and plan formulated with their assistance.  Please see their attached note for official documentation of the daily encounter.   LOS: 3 days   Donnalee Curry, Medical Student 02/24/2017, 11:10 AM  Pager [phone]

## 2017-02-24 NOTE — Telephone Encounter (Signed)
Letter mailed to the patient Follow up scheduled for 03/09/17 11:00 and labs entered for next week

## 2017-02-24 NOTE — Progress Notes (Signed)
Discharge instructions given to patient.  Patient verbalize understanding of discharge instructions.  PIV removed area clean/dry/Intact.    Discharge vitals  Vitals:   02/24/17 0543 02/24/17 0828  BP: (!) 108/53 (!) 102/55  Pulse: (!) 102 100  Resp: 18   Temp: 98.4 F (36.9 C)    Allergies as of 02/24/2017   No Known Allergies     Medication List    TAKE these medications   escitalopram 20 MG tablet Commonly known as:  LEXAPRO Take 20 mg by mouth daily.   feeding supplement (ENSURE ENLIVE) Liqd Take 237 mLs by mouth 2 (two) times daily between meals. What changed:  when to take this   ferrous sulfate 325 (65 FE) MG tablet Take 1 tablet (325 mg total) by mouth 2 (two) times daily with a meal.   furosemide 40 MG tablet Commonly known as:  LASIX Take 1 tablet (40 mg total) by mouth 2 (two) times daily.   loperamide 2 MG tablet Commonly known as:  IMODIUM A-D Take 1 tablet (2 mg total) by mouth 3 (three) times daily as needed for diarrhea or loose stools.   loratadine 10 MG tablet Commonly known as:  CLARITIN Take 10 mg by mouth daily as needed for allergies.   MELATONIN ER PO Take 1 capsule by mouth as needed (for sleep).   pantoprazole 20 MG tablet Commonly known as:  PROTONIX Take 1 tablet (20 mg total) by mouth daily before breakfast.   potassium chloride SA 20 MEQ tablet Commonly known as:  K-DUR,KLOR-CON Take 1 tablet (20 mEq total) by mouth daily.   spironolactone 100 MG tablet Commonly known as:  ALDACTONE Take 2 tablets (200 mg total) by mouth daily.   thiamine 100 MG tablet Commonly known as:  VITAMIN B-1 Take 1 tablet (100 mg total) by mouth daily.       Patient escorted to car via wheelchair Danne HarborGlodean Travez Stancil, RN 02/24/17 at 1207pm

## 2017-02-24 NOTE — Progress Notes (Signed)
      Progress Note   Subjective  Patient feeling well today. She wants to go home. LE edema is improved, breathing is improved. No pains.   Objective   Vital signs in last 24 hours: Temp:  [98.4 F (36.9 C)-99 F (37.2 C)] 98.4 F (36.9 C) (07/20 0543) Pulse Rate:  [100-120] 100 (07/20 0828) Resp:  [18] 18 (07/20 0543) BP: (102-113)/(50-55) 102/55 (07/20 0828) SpO2:  [95 %-100 %] 100 % (07/20 0543) Last BM Date: 02/23/17 General:    white female in NAD Heart:  Regular rate and rhythm; no murmurs Lungs: Respirations even and unlabored, clear on left, decreased on right Abdomen:  Soft, nontender, mildly distended.  Extremities:  (+) 1 LE edema. Neurologic:  Alert and oriented,  grossly normal neurologically. Psych:  Cooperative. Normal mood and affect.  Intake/Output from previous day: No intake/output data recorded. Intake/Output this shift: No intake/output data recorded.  Lab Results:  Recent Labs  02/21/17 1123 02/22/17 0414  WBC 10.0 9.5  HGB 9.1* 8.0*  HCT 28.7* 25.3*  PLT 170 142*   BMET  Recent Labs  02/22/17 0414 02/23/17 0455 02/24/17 0516  NA 135 133* 135  K 3.2* 3.4* 3.3*  CL 102 100* 98*  CO2 26 27 29   GLUCOSE 89 106* 103*  BUN <5* 5* 5*  CREATININE 0.48 0.51 0.47  CALCIUM 8.1* 8.1* 8.2*   LFT  Recent Labs  02/24/17 0516  PROT 6.8  ALBUMIN 2.0*  AST 104*  ALT 35  ALKPHOS 96  BILITOT 6.6*   PT/INR  Recent Labs  02/23/17 0455 02/24/17 0516  LABPROT 20.0* 19.1*  INR 1.68 1.59    Studies/Results: No results found.     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 MELD 20. We discussed initial management of this is diuretics which she has tolerated well and appears to be working, diuretics escalated yesterday, renal function stable. Labs for chronic liver disease sent, she needs hepatitis B vaccine as  outpatient. AFP level normal. Echo done yesterday, looks okay.  Recommend the following: - if no oxygen requirement, stable for discharge today - continued alcohol abstinence - low Na (<2gm / day) diet - continue lasix at 40mg  BID and aldactone to 200mg  / day. Repeat labs next week to ensure stable. She's had a nice response thus far, may be able to taper down dose pending labs next week and how she is feeling - NO NSAIDs, I have stopped ibuprofen, given risk for renal injury - await pending labs - hepatitis B vaccine as outpatient - f/u MRI in 6 months as outpatient  Our office will coordinate follow up labs and follow up with Dr. Leone PayorGessner.   Call with questions  Ileene PatrickSteven Lilygrace Rodick, MD [facility] Pager [phone]

## 2017-02-24 NOTE — Telephone Encounter (Signed)
-----   Message from Ruffin FrederickSteven Paul Armbruster, MD sent at 02/24/2017  9:51 AM EDT ----- Regarding: Leone PayorGessner patient [facility] She[facility], This patient should be discharged today. I would like her to have BMET next week to ensure renal function / electrolytes stable on her diuretic dose (lasix 40mg  BID, aldactone 200mg  / day). 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 She's responded really well, dosing may be able to be lowered soon pending her course. She should have a follow up scheduled with Dr. Reece AgarG or one of the APPs if possible in a few weeks. Thanks  Baldo Asharl, [place]

## 2017-02-24 NOTE — Discharge Summary (Signed)
Name: Rhonda Haley MRN: [id number] DOB: 03-07-1966 51 y.o. PCP: Lewis Moccasin, MD  Date of Admission: 02/21/2017 10:17 AM Date of Discharge: 02/24/2017 Attending Physician: Gust Rung, DO Discharge Diagnosis:  Principal Problem:   Hydrothorax Active Problems:   Normocytic anemia, not due to blood loss   Alcoholic cirrhosis of liver with ascites [facility])  Discharge Medications: Allergies as of 02/24/2017   No Known Allergies     Medication List    TAKE these medications   escitalopram 20 MG tablet Commonly known as:  LEXAPRO Take 20 mg by mouth daily.   feeding supplement (ENSURE ENLIVE) Liqd Take 237 mLs by mouth 2 (two) times daily between meals. What changed:  when to take this   ferrous sulfate 325 (65 FE) MG tablet Take 1 tablet (325 mg total) by mouth 2 (two) times daily with a meal.   furosemide 40 MG tablet Commonly known as:  LASIX Take 1 tablet (40 mg total) by mouth 2 (two) times daily.   loperamide 2 MG tablet Commonly known as:  IMODIUM A-D Take 1 tablet (2 mg total) by mouth 3 (three) times daily as needed for diarrhea or loose stools.   loratadine 10 MG tablet Commonly known as:  CLARITIN Take 10 mg by mouth daily as needed for allergies.   MELATONIN ER PO Take 1 capsule by mouth as needed (for sleep).   pantoprazole 20 MG tablet Commonly known as:  PROTONIX Take 1 tablet (20 mg total) by mouth daily before breakfast.   potassium chloride SA 20 MEQ tablet Commonly known as:  K-DUR,KLOR-CON Take 1 tablet (20 mEq total) by mouth daily.   spironolactone 100 MG tablet Commonly known as:  ALDACTONE Take 2 tablets (200 mg total) by mouth daily.   thiamine 100 MG tablet Commonly known as:  VITAMIN B-1 Take 1 tablet (100 mg total) by mouth daily.       Disposition and follow-up:   Rhonda Haley was discharged from [facility] in Stable condition.  At the hospital follow up visit please address:  1. Hydrothorax /  Alcoholic Liver Cirrhosis: Patient is to follow up with [facility] for labs in the first week after discharge and a clinic visit at the beginning of august. Please ensure she has followed up for a repeat CMP to monitor her liver function and response to diuretics. She has been prescribed 7 days of potassium supplement as prophylaxis while on diuretics. She was prescribed 10 days of diuretics. Please ensure her medications are adjusted or renewed based on her response.  2. Other: - Hepatitis B screen came back as NONIMMUNE, will need vaccination. - A 6 month follow up MRI is recommended to monitor the lesion on her liver.  3. Labs / imaging needed at time of follow-up: CMP (if not done by GI)  4. Pending labs/ test needing follow-up: Autoimmune hepatitis workup ordered and being followed by GI: ANA w/ Reflex, Anti-smooth muscle, IgG, Ceruloplasmin, Alpha-1-antitrypsin, AFP  Follow-up Appointments:   [facility]  Hospital Course by problem list: Principal Problem:   Hydrothorax Active Problems:   Normocytic anemia, not due to blood loss   Alcoholic cirrhosis of liver with ascites (HCC)   Hydrothorax: Patient presented with worsening shortness of breath for 2 days with 2 weeks of edema and ascites with an unclear date of onset. New (that day) scleral icterus was also present. Chest Xray show large right sided pleural effusion. A diagnostic and therapeutic thoracentesis was performed yielding  2L of clear yellow fluid. Her breathing improved following this procedure as did her chest xray. 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 The patient was placed on Lasix and Spironolactone. A echocardiogram was performed to rule out a cardiogenic component and was negative with EF 60-65%. Her breathing continued to improve, she was weaned off oxygen and was able to ambulate without feel short of breath.  Alcoholic Liver  Cirrhosis: The patient had recently been diagnosed with alcoholic liver cirrhosis and presented with worsening symptoms including her hydrothorax, ascites, edema, and scleral icterus. An MRILiver was ordered considering her worsening symptoms and that she was already scheduled to have one as a part of her outpatient work up by GI. This scan was complicated by motion artifact, but showed features compatible with cirrhosis, a lesion on liver with a recommended follow up interval of 6 months, and gall stones. GI was consulted to assist in management and further workup as they already followed her as an outpatient. Her CMP and INR were trended daily as well as her discriminate function score (which remained <32). She was treat with Spironolactone and Lasix as stated above. GI ordered labs for a autoimmune hepatitis workup. She was discharged with instruction to follow up with GI for labs to monitor her hepatic function and her response to her diuretic within the week as well as a clinic visit with GI and her Primary care.  Hypokalemia: She experienced mild hypokalemia and was placed on a potassium supplement as prophylaxis while on diuretics.   Anxiety, GERD, Anemia: Home medication were continued  Discharge Vitals:   BP (!) 102/55 (BP Location: Right Arm)   Pulse 100   Temp 98.4 F (36.9 C)   Resp 18   Ht 5' 8.5" (1.74 m)   Wt 144 lb (65.3 kg)   SpO2 100%   BMI 21.58 kg/m   Pertinent Labs, Studies, and Procedures:  CMP Latest Ref Rng & Units 02/24/2017 02/23/2017 02/22/2017  Glucose 65 - 99 mg/dL 161(W) 960(A) 89  BUN 6 - 20 mg/dL 5(L) 5(L) <5(W)  Creatinine 0.44 - 1.00 mg/dL 0.98 1.19 1.47  Sodium 135 - 145 mmol/L 135 133(L) 135  Potassium 3.5 - 5.1 mmol/L 3.3(L) 3.4(L) 3.2(L)  Chloride 101 - 111 mmol/L 98(L) 100(L) 102  CO2 22 - 32 mmol/L 29 27 26   Calcium 8.9 - 10.3 mg/dL 8.2(L) 8.1(L) 8.1(L)  Total Protein 6.5 - 8.1 g/dL 6.8 6.6 6.7  Total Bilirubin 0.3 - 1.2 mg/dL 6.6(H) 6.9(H) 6.6(H)    Alkaline Phos 38 - 126 U/L 96 94 95  AST 15 - 41 U/L 104(H) 127(H) 148(H)  ALT 14 - 54 U/L 35 36 36   CBC Latest Ref Rng & Units 02/22/2017 02/21/2017 01/20/2017  WBC 4.0 - 10.5 K/uL 9.5 10.0 8.9  Hemoglobin 12.0 - 15.0 g/dL 8.0(L) 9.1(L) 7.9(L)  Hematocrit 36.0 - 46.0 % 25.3(L) 28.7(L) 25.3(L)  Platelets 150 - 400 K/uL 142(L) 170 237   Prothrombin Time: 18.6 -> 20.0 -> 19.1  Pleural Fluid Analysis (at time of writing): Albumin: <1.0 (Serum 2.6) Protein: <3.0 (Serum 8.2) Glucose: 121 (Serum 111) Cell Count: WNL, Hazy yellow fluid Gram stain: Negative Culture: NGTD TGs: 29            EKG: - Sinus tachycardia - Rightward axis - Borderline ECG - No significant change since last tracing  CXR: - INITIAL CXR: IMPRESSION: Very large right pleural effusion  with collapse of the right lung. This may be due to liver failure. Also consider infection and tumor. Thoracentesis suggested.  - Post Thoracentesis CXR: IMPRESSION: 1. Status post right thoracentesis with reduced right pleural effusion and no pneumothorax. Moderate residual with right lung base atelectasis or consolidation. 2. Questionable abnormal right hilar contour. If the pleural fluid analysis is unrevealing then recommend follow-up Chest CT (IV contrast preferred).  MRI Liver W WO Contrast: IMPRESSION: 1. Exam detail is diminished secondary to motion artifact. Patient felt short of breath which was worsened by anxiety. 2. Morphologic features of liver compatible with cirrhosis. There is stigmata of portal venous hypertension including splenomegaly, varicosities and abdominal ascites. 3. Arterial phase enhancing structure within the left lobe of liver measures less than 1 cm and is considered a LR-3, which is indeterminate. Followup imaging in 6 months with repeat liver protocol MRI is advised. 4. Indeterminate enhancing lesion arising from posterior cortex of right kidney is identified. Difficult to assess due to motion  artifact. Cannot rule out solid enhancing neoplasm such as renal cell carcinoma. Consider further evaluation with renal protocol CT which may be less susceptible to respiratory motion artifact. 5. Right pleural effusion. 6. Gallstones.  Echocardiogram: Study Conclusions - Left ventricle: The cavity size was normal. Systolic function was normal. The estimated ejection fraction was in the range of 60% to 65%. Wall motion was normal; there were no regional wall motion abnormalities. Left ventricular diastolic function parameters were normal. Doppler parameters are consistent with high ventricular filling pressure. - Aortic valve: Transvalvular velocity was within the normal range. There was no stenosis. There was no regurgitation. Valve area (VTI): 1.47 cm^2. Valve area (Vmax): 1.47 cm^2. Valve area (Vmean): 1.57 cm^2. - Mitral valve: Transvalvular velocity was within the normal range. There was no evidence for stenosis. There was trivial   regurgitation. - Right ventricle: The cavity size was normal. Wall thickness was normal. Systolic function was normal. - Tricuspid valve: There was no regurgitation. - Pericardium, extracardiac: A trivial pericardial effusion was identified. Minimal ascites was noted.  Discharge Instructions: Discharge Instructions    Call MD for:  difficulty breathing, headache or visual disturbances    Complete by:  As directed    Call MD for:  persistant nausea and vomiting    Complete by:  As directed    Call MD for:  severe uncontrolled pain    Complete by:  As directed    Call MD for:  temperature >100.4    Complete by:  As directed    Diet - low sodium heart healthy    Complete by:  As directed    Discharge instructions    Complete by:  As directed    Please follow up with your GP in the next week and arrange your follow up with [facility].  You will needed to have lab work done in the next week to monitor your response to the diuretic that have been  added. Your GI doctor intends to arrange this lab work. However, if you do not hear from them in the next week, please ask your GP to perform the lab work (the test(s) needed will be available to your GP in the medical records.)  Your new medication are: - Furosemide (Lasix) 40mg , Twice Daily - Spironolactone (Aldactone) 200mg , Once Daily - Potassium Chloride , Once Daily  Please return to activity slowly as you may experience lightheadedness while on diuretics. Also, tell your doctor if you experience symptom of low potassium (i.e.  Weakness, Fatigue)  Please avoid taking NSAIDs (not taking them will help protect your kidneys)  Thank you for allowing us to care for you   Increase activity slowly    Complete by:  As directed       Signed: Beola CordAlexander Melvin, DO IM PGY-1 Pager: [phone]

## 2017-02-24 NOTE — Progress Notes (Signed)
Subjective: Rhonda Haley was feeling well this morning. She has been off oxygen since yesterday. She was able to ambulate off oxygen yesterday. While ambulating her oxygen saturation dropped to the mid 80's, but did not experience shortness of breath. She feels her breathing is greatly improved and her swelling has continued to go down and states she is ready to go home. The discharge plan was explained to her and she agreed to follow up with her primary care and GI doctors. She was counseled to take it easy while on the diuretics in case she were to feel light headed upon standing, she was also warned of the signs of low potassium (she will be sent out on a potassium supplement as prophylaxis.)  Objective:  Vital signs in last 24 hours: Vitals:   02/23/17 1504 02/23/17 2212 02/24/17 0543 02/24/17 0828  BP: (!) 104/52 (!) 113/50 (!) 108/53 (!) 102/55  Pulse: (!) 120 (!) 111 (!) 102 100  Resp: 18 18 18    Temp: 99 F (37.2 C) 99 F (37.2 C) 98.4 F (36.9 C)   TempSrc:  Oral    SpO2: 99% 95% 100%   Weight:      Height:       Physical Exam  Constitutional: She is oriented to person, place, and time. She appears well-developed and well-nourished. No distress.  HENT:  Head: Normocephalic and atraumatic.  Eyes: EOM are normal. Scleral icterus is present.  Cardiovascular: Normal rate and regular rhythm.  Exam reveals no gallop and no friction rub.   No murmur heard. Pulmonary/Chest: Effort normal. No respiratory distress.  - R Basilar: No breath sound auscultated - L lung and Bilat Apices: Clear  Musculoskeletal: She exhibits no deformity.  Trace Pitting Edema  Neurological: She is alert and oriented to person, place, and time.  Skin: Skin is warm and dry.   Assessment/Plan:  Hydrothorax: In the setting of known liver disease. 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 L Heart Failure tuled out with normal echocardiogram. She received thoracentesis yielding 2L of clear yellow fluid. 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 2016 MELD: 21 at admission. She quit alcohol use 1 month ago. MRI Liver was complicated by motion artifact, but showed features compatible with cirrhosis, lesion on liver with follow up of 6 moths recommended, and gall stones. - Appreciate GI recs - Lasix 40mg  BID - Spironolactone 200mg  Daily - Discriminate function score: 24.5  - Daily CMP  - Daily PT/INR  - Continue Thiamine  Anemia: - Ferrous Sulfate 325mg  Daily  GERD: - Protonix 20mg  Daily  Anxiety: - Continue home Lexipro  Hypokalemia (3.4 today) - 20 mEq Daily   F/E/N: Heart Diet DVT Phrophylaxis: Lovenox   Dispo: Anticipated discharge later Today.  Beola CordAlexander Bobbie Virden, DO  IM PGY-1 Pager: [phone]

## 2017-02-24 NOTE — Care Management Note (Signed)
Case Management Note  Patient Details  Name: [name] Pherigo MRN: [id number] Date of Birth: [birth year]  Subjective/Objective:    Esophagitis, Hydrothorax, alcoholic liver cirrhosis         Action/Plan: Discharge Planning: Chart reviewed, No NCM needs identified.   PCP Lewis MoccasinEWEY, ELIZABETH R MD  Expected Discharge Date:  02/24/17               Expected Discharge Plan:  Home/Self Care  In-House Referral:  NA  Discharge planning Services  CM Consult  Post Acute Care Choice:  NA Choice offered to:  NA  DME Arranged:  N/A DME Agency:  NA  HH Arranged:  NA HH Agency:  NA  Status of Service:  Completed, signed off  If discussed at Long Length of Stay Meetings, dates discussed:    Additional Comments:  Elliot CousinShavis, Tresea Heine Ellen, RN 02/24/2017, 11:10 AM

## 2017-02-25 LAB — ANTI-SMOOTH MUSCLE ANTIBODY, IGG: F-ACTIN AB IGG: 29 U — AB (ref 0–19)

## 2017-02-25 LAB — ENA+DNA/DS+ANTICH+CENTRO+JO...
Anti JO-1: <0.2 AI (ref 0.0–0.9)
Centromere Ab Screen: 1.3 AI — ABNORMAL HIGH (ref 0.0–0.9)
Chromatin Ab SerPl-aCnc: 0.2 AI (ref 0.0–0.9)
ENA SM Ab Ser-aCnc: <0.2 AI (ref 0.0–0.9)
Ribonucleic Protein: <0.2 AI (ref 0.0–0.9)
SSA (Ro) (ENA) Antibody, IgG: <0.2 AI (ref 0.0–0.9)
SSB (La) (ENA) Antibody, IgG: <0.2 AI (ref 0.0–0.9)
Scleroderma (Scl-70) (ENA) Antibody, IgG: <0.2 AI (ref 0.0–0.9)
ds DNA Ab: 2 IU/mL (ref 0–9)

## 2017-02-25 LAB — ANA W/REFLEX IF POSITIVE: Anti Nuclear Antibody(ANA): POSITIVE — AB

## 2017-02-26 LAB — CULTURE, BODY FLUID W GRAM STAIN -BOTTLE

## 2017-02-26 LAB — CULTURE, BODY FLUID-BOTTLE: CULTURE: NO GROWTH

## 2017-03-09 ENCOUNTER — Encounter: Payer: Self-pay | Admitting: Gastroenterology

## 2017-03-09 ENCOUNTER — Ambulatory Visit (INDEPENDENT_AMBULATORY_CARE_PROVIDER_SITE_OTHER): Payer: 59 | Admitting: Gastroenterology

## 2017-03-09 ENCOUNTER — Encounter (INDEPENDENT_AMBULATORY_CARE_PROVIDER_SITE_OTHER): Payer: Self-pay

## 2017-03-09 VITALS — BP 100/60 | HR 64 | Ht 69.0 in | Wt 129.4 lb

## 2017-03-09 DIAGNOSIS — K7031 Alcoholic cirrhosis of liver with ascites: Secondary | ICD-10-CM

## 2017-03-09 DIAGNOSIS — R17 Unspecified jaundice: Secondary | ICD-10-CM | POA: Insufficient documentation

## 2017-03-09 DIAGNOSIS — J948 Other specified pleural conditions: Secondary | ICD-10-CM

## 2017-03-09 NOTE — Patient Instructions (Signed)
Your physician has requested that you go to the basement for  lab work before leaving today.   

## 2017-03-09 NOTE — Progress Notes (Addendum)
03/09/2017 Rhonda Haley [id number] Rhonda Haley   HISTORY OF PRESENT ILLNESS:  This is a pleasant 51 year old female with recent diagnosis of ETOH cirrhosis/liver disease with associated ascites, hepatic hydrothorax, and mild coagulopathy. Her most recent labs show some worsening of her liver function test with total bili up to 8.6, AST 592, and ALT 270.  Otherwise she is on Lasix 40 mg twice a day and spironolactone 200 mg daily. Renal function is stable as well as electrolytes and these have been helping significantly as she seems to have minimal ascites and says that her breathing is much improved.  She is working and says that it has been going well.  Overall she is feeling better.  No ETOH in 6 weeks at this point.  Past Medical History:  Diagnosis Date  . Alcoholic liver disease (HCC) 01/19/2017  . Allergy   . Anxiety   . Blood transfusion without reported diagnosis   . Symptomatic anemia 01/19/2017   Past Surgical History:  Procedure Laterality Date  . FERTILITY SURGERY  2003   in vitro fertilization  . IR THORACENTESIS ASP PLEURAL SPACE W/IMG GUIDE  02/21/2017  . WISDOM TOOTH EXTRACTION     age 51    reports that she has been smoking Cigarettes.  She has a 14.00 pack-year smoking history. She has never used smokeless tobacco. She reports that she does not drink alcohol or use drugs. family history includes Brain cancer in her mother. No Known Allergies    Outpatient Encounter Prescriptions as of 03/09/2017  Medication Sig  . escitalopram (LEXAPRO) 20 MG tablet Take 20 mg by mouth daily.  . feeding supplement, ENSURE ENLIVE, (ENSURE ENLIVE) LIQD Take 237 mLs by mouth 2 (two) times daily between meals. (Patient taking differently: Take 237 mLs by mouth daily. )  . ferrous sulfate 325 (65 FE) MG tablet Take 1 tablet (325 mg total) by mouth 2 (two) times daily with a meal.  . loperamide (IMODIUM A-D) 2 MG tablet Take 1 tablet (2 mg total) by mouth 3 (three) times daily as needed for  diarrhea or loose stools.  Marland Kitchen. MELATONIN ER PO Take 1 capsule by mouth as needed (for sleep).   . pantoprazole (PROTONIX) 20 MG tablet Take 1 tablet (20 mg total) by mouth daily before breakfast.  . thiamine (VITAMIN B-1) 100 MG tablet Take 1 tablet (100 mg total) by mouth daily.  . furosemide (LASIX) 40 MG tablet Take 1 tablet (40 mg total) by mouth 2 (two) times daily.  . potassium chloride SA (K-DUR,KLOR-CON) 20 MEQ tablet Take 1 tablet (20 mEq total) by mouth daily.  Marland Kitchen. spironolactone (ALDACTONE) 100 MG tablet Take 2 tablets (200 mg total) by mouth daily.  . [DISCONTINUED] loratadine (CLARITIN) 10 MG tablet Take 10 mg by mouth daily as needed for allergies.   No facility-administered encounter medications on file as of 03/09/2017.      REVIEW OF SYSTEMS  : All other systems reviewed and negative except where noted in the History of Present Illness.   PHYSICAL EXAM: BP 100/60   Pulse 64   Ht 5\' 9"  (1.753 m)   Wt 129 lb 6 oz (58.7 kg)   BMI 19.11 kg/m  General: Well developed white female in no acute distress; jaundice noted. Head: Normocephalic and atraumatic Eyes:  Scleral icterus noted. Ears: Normal auditory acuity Lungs: Clear throughout to auscultation; no increased WOB. Heart: Regular rate and rhythm; no M/R/G. Abdomen: Soft, mildly distended.  BS present.  Non-tender.  Musculoskeletal: Symmetrical with no gross deformities  Skin: No lesions on visible extremities Extremities: No edema  Neurological: Alert oriented x 4, grossly non-focal; mentating well and no asterixis noted. Psychological:  Alert and cooperative. Normal mood and affect  ASSESSMENT AND PLAN: *ETOH cirrhosis/liver disease with associated ascites, hepatic hydrothorax, and mild coagulopathy. Her most recent labs show some worsening of her liver function test. I would like her to have repeat labs performed and asked her to have them drawn today, but she asked if she could have them done next week since she is  tired of getting stuck. She promised to return Monday or Tuesday of next week and or to recheck CBC, CMP, PT/INR. ? If she will need steroids but we will see what repeat labs show and calculate update DF.  Otherwise she is on Lasix 40 mg twice a day and spironolactone 200 mg daily. Renal function is stable as well as electrolytes and these have been helping significantly. We'll continue on current doses. She does have follow-up with Dr. Leone Haley the end of this month which she will keep, but we'll also be in contact lab results, etc. in the interim. *Liver lesion:  AFP is normal.  Recommended MRI liver protocol in 6 months.  **Of note, she had some other abnormalities in a few autoimmune markers. Unsure if these really represent anything significant. Dr. Leone Haley to review.  CC:  Rhonda Haley  Agree with [name]'s management.  Rhonda Booparl E. Gessner, Haley, Clementeen GrahamFACG

## 2017-03-14 ENCOUNTER — Other Ambulatory Visit (INDEPENDENT_AMBULATORY_CARE_PROVIDER_SITE_OTHER): Payer: 59

## 2017-03-14 DIAGNOSIS — K7031 Alcoholic cirrhosis of liver with ascites: Secondary | ICD-10-CM

## 2017-03-14 DIAGNOSIS — R17 Unspecified jaundice: Secondary | ICD-10-CM

## 2017-03-14 DIAGNOSIS — J948 Other specified pleural conditions: Secondary | ICD-10-CM

## 2017-03-14 LAB — CBC WITH DIFFERENTIAL/PLATELET
BASOS PCT: 1.1 % (ref 0.0–3.0)
Basophils Absolute: 0.1 10*3/uL (ref 0.0–0.1)
EOS ABS: 0.1 10*3/uL (ref 0.0–0.7)
Eosinophils Relative: 0.6 % (ref 0.0–5.0)
HCT: 28.8 % — ABNORMAL LOW (ref 36.0–46.0)
Hemoglobin: 9.7 g/dL — ABNORMAL LOW (ref 12.0–15.0)
LYMPHS ABS: 1.9 10*3/uL (ref 0.7–4.0)
Lymphocytes Relative: 14.6 % (ref 12.0–46.0)
MCHC: 33.5 g/dL (ref 30.0–36.0)
MCV: 97.9 fl (ref 78.0–100.0)
MONO ABS: 1 10*3/uL (ref 0.1–1.0)
Monocytes Relative: 7.5 % (ref 3.0–12.0)
NEUTROS ABS: 10 10*3/uL — AB (ref 1.4–7.7)
Neutrophils Relative %: 76.2 % (ref 43.0–77.0)
PLATELETS: 162 10*3/uL (ref 150.0–400.0)
RBC: 2.94 Mil/uL — ABNORMAL LOW (ref 3.87–5.11)
RDW: 17 % — AB (ref 11.5–15.5)
WBC: 13.1 10*3/uL — AB (ref 4.0–10.5)

## 2017-03-14 LAB — COMPREHENSIVE METABOLIC PANEL
ALK PHOS: 128 U/L — AB (ref 39–117)
ALT: 162 U/L — ABNORMAL HIGH (ref 0–35)
AST: 193 U/L — AB (ref 0–37)
Albumin: 2.6 g/dL — ABNORMAL LOW (ref 3.5–5.2)
BUN: 11 mg/dL (ref 6–23)
CO2: 21 mEq/L (ref 19–32)
Calcium: 8.7 mg/dL (ref 8.4–10.5)
Chloride: 96 mEq/L (ref 96–112)
Creatinine, Ser: 0.74 mg/dL (ref 0.40–1.20)
GFR: 87.12 mL/min (ref >60.00)
GLUCOSE: 109 mg/dL — AB (ref 70–99)
POTASSIUM: 4.7 meq/L (ref 3.5–5.1)
SODIUM: 125 meq/L — AB (ref 135–145)
Total Bilirubin: 25.8 mg/dL — ABNORMAL HIGH (ref 0.2–1.2)
Total Protein: 8.1 g/dL (ref 6.0–8.3)

## 2017-03-14 LAB — PROTIME-INR
INR: 2 ratio — ABNORMAL HIGH (ref 0.8–1.0)
PROTHROMBIN TIME: 21.9 s — AB (ref 9.6–13.1)

## 2017-03-16 ENCOUNTER — Other Ambulatory Visit: Payer: Self-pay | Admitting: Internal Medicine

## 2017-03-17 ENCOUNTER — Telehealth: Payer: Self-pay | Admitting: Gastroenterology

## 2017-03-17 ENCOUNTER — Emergency Department (HOSPITAL_COMMUNITY): Payer: 59

## 2017-03-17 ENCOUNTER — Inpatient Hospital Stay (HOSPITAL_COMMUNITY)
Admission: EM | Admit: 2017-03-17 | Discharge: 2017-03-21 | DRG: 186 | Disposition: A | Discharge status: Home / Self Care | Payer: 59 | Attending: Internal Medicine | Admitting: Internal Medicine

## 2017-03-17 ENCOUNTER — Encounter (HOSPITAL_COMMUNITY): Payer: Self-pay | Admitting: *Deleted

## 2017-03-17 DIAGNOSIS — J948 Other specified pleural conditions: Secondary | ICD-10-CM | POA: Diagnosis present

## 2017-03-17 DIAGNOSIS — R0602 Shortness of breath: Secondary | ICD-10-CM

## 2017-03-17 DIAGNOSIS — D649 Anemia, unspecified: Secondary | ICD-10-CM | POA: Diagnosis present

## 2017-03-17 DIAGNOSIS — D689 Coagulation defect, unspecified: Secondary | ICD-10-CM | POA: Diagnosis present

## 2017-03-17 DIAGNOSIS — E43 Unspecified severe protein-calorie malnutrition: Secondary | ICD-10-CM | POA: Diagnosis present

## 2017-03-17 DIAGNOSIS — Z9889 Other specified postprocedural states: Secondary | ICD-10-CM

## 2017-03-17 DIAGNOSIS — E871 Hypo-osmolality and hyponatremia: Secondary | ICD-10-CM | POA: Diagnosis present

## 2017-03-17 DIAGNOSIS — Z79899 Other long term (current) drug therapy: Secondary | ICD-10-CM

## 2017-03-17 DIAGNOSIS — K7011 Alcoholic hepatitis with ascites: Secondary | ICD-10-CM | POA: Diagnosis present

## 2017-03-17 DIAGNOSIS — Z87891 Personal history of nicotine dependence: Secondary | ICD-10-CM

## 2017-03-17 DIAGNOSIS — R17 Unspecified jaundice: Secondary | ICD-10-CM | POA: Diagnosis not present

## 2017-03-17 DIAGNOSIS — R188 Other ascites: Secondary | ICD-10-CM

## 2017-03-17 DIAGNOSIS — K7031 Alcoholic cirrhosis of liver with ascites: Secondary | ICD-10-CM | POA: Diagnosis present

## 2017-03-17 DIAGNOSIS — K652 Spontaneous bacterial peritonitis: Secondary | ICD-10-CM

## 2017-03-17 DIAGNOSIS — K7030 Alcoholic cirrhosis of liver without ascites: Secondary | ICD-10-CM | POA: Diagnosis present

## 2017-03-17 DIAGNOSIS — K7460 Unspecified cirrhosis of liver: Secondary | ICD-10-CM

## 2017-03-17 DIAGNOSIS — Z681 Body mass index (BMI) 19 or less, adult: Secondary | ICD-10-CM

## 2017-03-17 DIAGNOSIS — N179 Acute kidney failure, unspecified: Secondary | ICD-10-CM | POA: Diagnosis present

## 2017-03-17 DIAGNOSIS — D696 Thrombocytopenia, unspecified: Secondary | ICD-10-CM | POA: Diagnosis present

## 2017-03-17 DIAGNOSIS — J9600 Acute respiratory failure, unspecified whether with hypoxia or hypercapnia: Secondary | ICD-10-CM | POA: Diagnosis present

## 2017-03-17 LAB — BASIC METABOLIC PANEL
ANION GAP: 8 (ref 5–15)
BUN: 13 mg/dL (ref 6–20)
CALCIUM: 8.9 mg/dL (ref 8.9–10.3)
CO2: 18 mmol/L — AB (ref 22–32)
CREATININE: 0.82 mg/dL (ref 0.44–1.00)
Chloride: 103 mmol/L (ref 101–111)
Glucose, Bld: 119 mg/dL — ABNORMAL HIGH (ref 65–99)
Potassium: 4.8 mmol/L (ref 3.5–5.1)
Sodium: 129 mmol/L — ABNORMAL LOW (ref 135–145)

## 2017-03-17 LAB — CBC
HCT: 26.2 % — ABNORMAL LOW (ref 36.0–46.0)
Hemoglobin: 8.7 g/dL — ABNORMAL LOW (ref 12.0–15.0)
MCH: 31.8 pg (ref 26.0–34.0)
MCHC: 33.2 g/dL (ref 30.0–36.0)
MCV: 95.6 fL (ref 78.0–100.0)
PLATELETS: 123 10*3/uL — AB (ref 150–400)
RBC: 2.74 MIL/uL — AB (ref 3.87–5.11)
RDW: 17.2 % — ABNORMAL HIGH (ref 11.5–15.5)
WBC: 10.2 10*3/uL (ref 4.0–10.5)

## 2017-03-17 LAB — HEPATIC FUNCTION PANEL
ALBUMIN: 2 g/dL — AB (ref 3.5–5.0)
ALT: 194 U/L — ABNORMAL HIGH (ref 14–54)
AST: 228 U/L — ABNORMAL HIGH (ref 15–41)
Alkaline Phosphatase: 116 U/L (ref 38–126)
BILIRUBIN DIRECT: 17.5 mg/dL — AB (ref 0.1–0.5)
BILIRUBIN TOTAL: 30 mg/dL — AB (ref 0.3–1.2)
Indirect Bilirubin: 12.5 mg/dL — ABNORMAL HIGH (ref 0.3–0.9)
Total Protein: 9.1 g/dL — ABNORMAL HIGH (ref 6.5–8.1)

## 2017-03-17 LAB — PROTIME-INR
INR: 2.18
PROTHROMBIN TIME: 24.6 s — AB (ref 11.4–15.2)

## 2017-03-17 LAB — I-STAT TROPONIN, ED: TROPONIN I, POC: 0 ng/mL (ref 0.00–0.08)

## 2017-03-17 MED ORDER — FERROUS SULFATE 325 (65 FE) MG PO TABS: 325.0000 mg | ORAL_TABLET | Freq: Two times a day (BID) | ORAL | 0 refills | Status: AC

## 2017-03-17 MED ORDER — VITAMIN B-1 100 MG PO TABS: 100.0000 mg | ORAL_TABLET | Freq: Every day | ORAL | 0 refills | Status: DC

## 2017-03-17 MED ORDER — SPIRONOLACTONE 100 MG PO TABS: 200.0000 mg | ORAL_TABLET | Freq: Every day | ORAL | 0 refills | Status: DC

## 2017-03-17 NOTE — ED Triage Notes (Signed)
Pt here for SOB, pt states, " I have the same symptoms I had a few weeks ago when I got admitted and had to have fluid pulled off my right lung." pt states, "some of my liver enzymes came back high." pt seen by PCP  Today and told to come here because she listened to my lung and didn't hear anything." pt speaks in full sentences, pt states, "It just feels tightness in my chest really."

## 2017-03-17 NOTE — Telephone Encounter (Signed)
Ok to refill 

## 2017-03-17 NOTE — ED Notes (Signed)
Crit lab result communicated to Marcina MillardMizera, MD.

## 2017-03-17 NOTE — ED Provider Notes (Signed)
[facility]-EMERGENCY DEPT Provider Note   CSN: [id number] Arrival date & time: 03/17/17  1512     History   Chief Complaint Chief Complaint  Patient presents with  . Shortness of Breath  . Jaundice    HPI Rhonda Haley is a 51 y.o. female. With history of alcoholic liver cirrhosis who presents with shortness of breath and jaundice. 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 She reports for the past 3-4 days, she has had increased shortness of breath, worse with laying down, requiring her to sleep sitting up. She has noticed increased swelling of her abdomen and her lower extremities. She has continued take spironolactone and Lasix as instructed. She denies any fevers, chills, chest pain, palpitations, vomiting or diarrhea. She reports symptoms are similar to her previous admission in terms of her shortness of breath.  HPI  Past Medical History:  Diagnosis Date  . Alcoholic liver disease (HCC) 01/19/2017  . Allergy   . Anxiety   . Blood transfusion without reported diagnosis   . Symptomatic anemia 01/19/2017    Patient Active Problem List   Diagnosis Date Noted  . Jaundice 03/09/2017  . Cholelithiasis 02/24/2017  . Abnormal MRI, liver 02/24/2017  . 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 Protein-calorie malnutrition, severe 02/21/2017  . Diarrhea   . Normocytic anemia, not due to blood loss 01/19/2017  . Alcoholic cirrhosis of liver with ascites (HCC) 01/19/2017    Past Surgical History:  Procedure Laterality Date  . FERTILITY SURGERY  2003   in vitro fertilization  . IR THORACENTESIS ASP PLEURAL SPACE W/IMG GUIDE  02/21/2017  . WISDOM TOOTH EXTRACTION     age 73    OB History    No data available       Home Medications    Prior to Admission medications   Medication Sig Start Date End Date Taking? Authorizing Provider  escitalopram (LEXAPRO) 20 MG tablet Take 20 mg by mouth daily. 01/18/17  Yes [provider]  ferrous sulfate 325 (65 FE) MG tablet Take 1 tablet (325 mg total) by mouth 2 (two) times daily with a meal. 03/17/17  Yes Zehr, Shanda Bumps D, PA-C  furosemide (LASIX) 40 MG tablet Take 1 tablet (40 mg total) by mouth 2 (two) times daily. 02/24/17 03/17/17 Yes Beola Cord, MD  Melatonin 10 MG TABS Take 10 mg by mouth at bedtime.   Yes [provider]  pantoprazole (PROTONIX) 20 MG tablet Take 1 tablet (20 mg total) by mouth daily before breakfast. 02/01/17  Yes Iva Boop, MD  potassium chloride SA (K-DUR,KLOR-CON) 20 MEQ tablet Take 1 tablet (20 mEq total) by mouth daily. 02/24/17 03/17/17 Yes Beola Cord, MD  spironolactone (ALDACTONE) 100 MG tablet Take 2 tablets (200 mg total) by mouth daily. 03/17/17 03/27/17 Yes Zehr, Princella Pellegrini, PA-C  thiamine (VITAMIN B-1) 100 MG tablet Take 1 tablet (100 mg total) by mouth daily. 03/17/17  Yes Zehr, Princella Pellegrini, PA-C  feeding supplement, ENSURE ENLIVE, (ENSURE ENLIVE) LIQD Take 237 mLs by mouth 2 (two) times daily between meals. Patient not taking: Reported on 03/17/2017 01/21/17   Maretta Bees, MD  loperamide (IMODIUM A-D) 2 MG tablet Take 1 tablet (2 mg total) by mouth 3 (three) times daily as needed for diarrhea or loose stools. Patient not taking: Reported on 03/17/2017 01/20/17   Maretta Bees, MD    Family History Family History  Problem Relation Age of Onset  . Brain cancer Mother   .  Colon cancer Neg Hx   . Esophageal cancer Neg Hx   . Rectal cancer Neg Hx   . Stomach cancer Neg Hx     Social History Social History  Substance Use Topics  . Smoking status: Former Smoker    Packs/day: 0.50    Years: 28.00    Types: Cigarettes    Quit date: 03/03/2017  . Smokeless tobacco: Never Used  . Alcohol use No     Comment: quit x 6 wks ago     Allergies   Patient has no known allergies.   Review of Systems Review of Systems  Constitutional: Negative for chills and fever.       Jaundice  HENT:  Negative for ear pain and sore throat.   Eyes: Negative for pain and visual disturbance.  Respiratory: Positive for shortness of breath. Negative for cough.   Cardiovascular: Positive for leg swelling. Negative for chest pain and palpitations.  Gastrointestinal: Positive for abdominal distention. Negative for abdominal pain and vomiting.  Genitourinary: Negative for dysuria and hematuria.  Musculoskeletal: Negative for arthralgias and back pain.  Skin: Negative for color change and rash.  Neurological: Negative for seizures and syncope.  All other systems reviewed and are negative.    Physical Exam Updated Vital Signs BP (!) 105/49 (BP Location: Left Arm)   Pulse 92   Temp 97.8 F (36.6 C) (Oral)   Resp 16   Ht 5' 8.5" (1.74 m)   Wt 55.8 kg (123 lb)   SpO2 98%   BMI 18.43 kg/m   Physical Exam  Constitutional: She is oriented to person, place, and time. She appears well-developed and well-nourished. No distress.  HENT:  Head: Normocephalic and atraumatic.  Eyes: Scleral icterus is present.  Neck: Neck supple.  Cardiovascular: Normal rate and regular rhythm.   No murmur heard. Pulmonary/Chest: Tachypnea noted. No respiratory distress. She has decreased breath sounds in the right upper field, the right middle field and the right lower field.  Abdominal: Soft. She exhibits distension. There is no tenderness. There is no rebound and no guarding.  Musculoskeletal: She exhibits no edema.  Neurological: She is alert and oriented to person, place, and time. No cranial nerve deficit or sensory deficit. She exhibits normal muscle tone. Coordination normal.  Skin: Skin is warm and dry.  Yellowing of skin  Psychiatric: She has a normal mood and affect.  Nursing note and vitals reviewed.    ED Treatments / Results  Labs (all labs ordered are listed, but only abnormal results are displayed) Labs Reviewed  BASIC METABOLIC PANEL - Abnormal; Notable for the following:       Result  Value   Sodium 129 (*)    CO2 18 (*)    Glucose, Bld 119 (*)    All other components within normal limits  CBC - Abnormal; Notable for the following:    RBC 2.74 (*)    Hemoglobin 8.7 (*)    HCT 26.2 (*)    RDW 17.2 (*)    Platelets 123 (*)    All other components within normal limits  PROTIME-INR - Abnormal; Notable for the following:    Prothrombin Time 24.6 (*)    All other components within normal limits  HEPATIC FUNCTION PANEL  I-STAT TROPONIN, ED    EKG  EKG Interpretation None       Radiology Dg Chest 2 View  Result Date: 03/17/2017 CLINICAL DATA:  Shortness of breath. EXAM: CHEST  2 VIEW COMPARISON:  Chest x-ray  02/21/2017 FINDINGS: Large right-sided pleural effusion with overlying atelectasis. The left lung is relatively clear. The cardiac silhouette, mediastinal and hilar contours are grossly normal and stable. The bony thorax is intact. IMPRESSION: Large right pleural effusion. Electronically Signed   By: Rudie MeyerP.  Gallerani M.D.   On: 03/17/2017 15:55    Procedures Procedures (including critical care time)  Medications Ordered in ED Medications - No data to display   Initial Impression / Assessment and Plan / ED Course  I have reviewed the triage vital signs and the nursing notes.  Pertinent labs & imaging results that were available during my care of the patient were reviewed by me and considered in my medical decision making (see chart for details).    Patient is a 51 year old female with history of alcoholic liver cirrhosis who presents with shortness of breath and jaundice. 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 Patient arrived hemodynamically stable, mild tachypnea saturating well on room air, exam as above significant for decreased lung sounds diffusely and right lung fields, abdominal distention, trace lower extremity edema. Patient yellow appearing, with scleral icterus.  Labs  significant for hyponatremia (135 at previous discharge). No leukocytosis. Of note bilirubin increased from 6 to 30 today over past 3 weeks. Chest radiograph showing large right pleural effusion.   Findings consistent with worsening liver failure. Patient does not need emergent IR drainage of hydrothorax or paracentesis since she is hemodynamically stable and saturating well on room air. She will likely need IR consult in AM, and perhaps GI consultation too given worsening of liver function.    Discussed with hospitalist for admission. Patient in agreement with plan at time of admission.   Patient and plan of care discussed with Attending physician, Dr. Denton LankSteinl.    Final Clinical Impressions(s) / ED Diagnoses   Final diagnoses:  Hyperbilirubinemia  Cirrhosis of liver with ascites, unspecified hepatic cirrhosis type (HCC)  Ascites due to alcoholic cirrhosis (HCC)  Hydrothorax  Jaundice    New Prescriptions New Prescriptions   No medications on file         Wynelle ClevelandMizera, Mannat Benedetti, MD 03/17/17 [id number]    Cathren LaineSteinl, Kevin, MD 03/18/17 1511

## 2017-03-17 NOTE — Telephone Encounter (Signed)
The pt was advised to follow up with PCP regarding her chest congestion.  She states she also requested refills on ferrous sulfate, spironolactone and B1 (thiamine).  Pt was advised that the refill request went to PCP.  She states she is about out and could we refill.  Jessica per your note she is to continue meds as ordered.  Can we refill enough to last until appt with Leone PayorGessner on 8/29?

## 2017-03-18 ENCOUNTER — Encounter (HOSPITAL_COMMUNITY): Payer: Self-pay | Admitting: *Deleted

## 2017-03-18 DIAGNOSIS — J948 Other specified pleural conditions: Principal | ICD-10-CM

## 2017-03-18 DIAGNOSIS — D689 Coagulation defect, unspecified: Secondary | ICD-10-CM

## 2017-03-18 DIAGNOSIS — N179 Acute kidney failure, unspecified: Secondary | ICD-10-CM | POA: Diagnosis present

## 2017-03-18 DIAGNOSIS — E8809 Other disorders of plasma-protein metabolism, not elsewhere classified: Secondary | ICD-10-CM | POA: Diagnosis not present

## 2017-03-18 DIAGNOSIS — R17 Unspecified jaundice: Secondary | ICD-10-CM | POA: Diagnosis not present

## 2017-03-18 DIAGNOSIS — J9600 Acute respiratory failure, unspecified whether with hypoxia or hypercapnia: Secondary | ICD-10-CM | POA: Diagnosis present

## 2017-03-18 DIAGNOSIS — K7011 Alcoholic hepatitis with ascites: Secondary | ICD-10-CM

## 2017-03-18 DIAGNOSIS — D739 Disease of spleen, unspecified: Secondary | ICD-10-CM | POA: Diagnosis not present

## 2017-03-18 DIAGNOSIS — D649 Anemia, unspecified: Secondary | ICD-10-CM | POA: Diagnosis present

## 2017-03-18 DIAGNOSIS — K7031 Alcoholic cirrhosis of liver with ascites: Secondary | ICD-10-CM | POA: Insufficient documentation

## 2017-03-18 DIAGNOSIS — Z681 Body mass index (BMI) 19 or less, adult: Secondary | ICD-10-CM | POA: Diagnosis not present

## 2017-03-18 DIAGNOSIS — E871 Hypo-osmolality and hyponatremia: Secondary | ICD-10-CM | POA: Diagnosis present

## 2017-03-18 DIAGNOSIS — K7460 Unspecified cirrhosis of liver: Secondary | ICD-10-CM | POA: Diagnosis not present

## 2017-03-18 DIAGNOSIS — Z87891 Personal history of nicotine dependence: Secondary | ICD-10-CM | POA: Diagnosis not present

## 2017-03-18 DIAGNOSIS — E46 Unspecified protein-calorie malnutrition: Secondary | ICD-10-CM | POA: Diagnosis not present

## 2017-03-18 DIAGNOSIS — Z79899 Other long term (current) drug therapy: Secondary | ICD-10-CM | POA: Diagnosis not present

## 2017-03-18 DIAGNOSIS — D696 Thrombocytopenia, unspecified: Secondary | ICD-10-CM | POA: Diagnosis present

## 2017-03-18 DIAGNOSIS — Z9889 Other specified postprocedural states: Secondary | ICD-10-CM | POA: Diagnosis not present

## 2017-03-18 DIAGNOSIS — E43 Unspecified severe protein-calorie malnutrition: Secondary | ICD-10-CM | POA: Diagnosis present

## 2017-03-18 HISTORY — DX: Other specified pleural conditions: J94.8

## 2017-03-18 LAB — COMPREHENSIVE METABOLIC PANEL
ALBUMIN: 2 g/dL — AB (ref 3.5–5.0)
ALK PHOS: 99 U/L (ref 38–126)
ALT: 184 U/L — ABNORMAL HIGH (ref 14–54)
ALT: 172 U/L — AB (ref 14–54)
ANION GAP: 8 (ref 5–15)
AST: 217 U/L — ABNORMAL HIGH (ref 15–41)
AST: 207 U/L — ABNORMAL HIGH (ref 15–41)
Albumin: 1.9 g/dL — ABNORMAL LOW (ref 3.5–5.0)
Alkaline Phosphatase: 108 U/L (ref 38–126)
Anion gap: 7 (ref 5–15)
BILIRUBIN TOTAL: 28.2 mg/dL — AB (ref 0.3–1.2)
BUN: 14 mg/dL (ref 6–20)
BUN: 16 mg/dL (ref 6–20)
CALCIUM: 9 mg/dL (ref 8.9–10.3)
CHLORIDE: 101 mmol/L (ref 101–111)
CHLORIDE: 101 mmol/L (ref 101–111)
CO2: 20 mmol/L — ABNORMAL LOW (ref 22–32)
CO2: 18 mmol/L — AB (ref 22–32)
Calcium: 9.2 mg/dL (ref 8.9–10.3)
Creatinine, Ser: 0.96 mg/dL (ref 0.44–1.00)
Creatinine, Ser: 1.12 mg/dL — ABNORMAL HIGH (ref 0.44–1.00)
GFR calc non Af Amer: 55 mL/min — ABNORMAL LOW (ref >60)
GLUCOSE: 85 mg/dL (ref 65–99)
Glucose, Bld: 109 mg/dL — ABNORMAL HIGH (ref 65–99)
POTASSIUM: 5.2 mmol/L — AB (ref 3.5–5.1)
Potassium: 5.4 mmol/L — ABNORMAL HIGH (ref 3.5–5.1)
SODIUM: 127 mmol/L — AB (ref 135–145)
Sodium: 128 mmol/L — ABNORMAL LOW (ref 135–145)
TOTAL PROTEIN: 8.7 g/dL — AB (ref 6.5–8.1)
Total Bilirubin: 29.6 mg/dL (ref 0.3–1.2)
Total Protein: 8.5 g/dL — ABNORMAL HIGH (ref 6.5–8.1)

## 2017-03-18 LAB — CBC
HEMATOCRIT: 25.2 % — AB (ref 36.0–46.0)
Hemoglobin: 8.5 g/dL — ABNORMAL LOW (ref 12.0–15.0)
MCH: 31.6 pg (ref 26.0–34.0)
MCHC: 33.7 g/dL (ref 30.0–36.0)
MCV: 93.7 fL (ref 78.0–100.0)
PLATELETS: 123 10*3/uL — AB (ref 150–400)
RBC: 2.69 MIL/uL — ABNORMAL LOW (ref 3.87–5.11)
RDW: 16.8 % — AB (ref 11.5–15.5)
WBC: 10.4 10*3/uL (ref 4.0–10.5)

## 2017-03-18 LAB — AMMONIA: Ammonia: 22 umol/L (ref 9–35)

## 2017-03-18 LAB — PROTIME-INR
INR: 2.09
Prothrombin Time: 23.8 seconds — ABNORMAL HIGH (ref 11.4–15.2)

## 2017-03-18 MED ORDER — FUROSEMIDE 10 MG/ML IJ SOLN
40.0000 mg | Freq: Two times a day (BID) | INTRAMUSCULAR | Status: DC
  Administered 2017-03-18 – 2017-03-19 (×3): 40 mg via INTRAVENOUS
  Filled 2017-03-18 (×3): qty 4

## 2017-03-18 MED ORDER — PANTOPRAZOLE SODIUM 20 MG PO TBEC
20.0000 mg | DELAYED_RELEASE_TABLET | Freq: Every day | ORAL | Status: DC
  Administered 2017-03-18 – 2017-03-21 (×4): 20 mg via ORAL
  Filled 2017-03-18 (×4): qty 1

## 2017-03-18 MED ORDER — PREDNISOLONE 5 MG PO TABS
40.0000 mg | ORAL_TABLET | Freq: Every day | ORAL | Status: DC
  Administered 2017-03-18 – 2017-03-21 (×4): 40 mg via ORAL
  Filled 2017-03-18 (×4): qty 8

## 2017-03-18 MED ORDER — VITAMIN B-1 100 MG PO TABS
100.0000 mg | ORAL_TABLET | Freq: Every day | ORAL | Status: DC
  Administered 2017-03-18 – 2017-03-21 (×4): 100 mg via ORAL
  Filled 2017-03-18 (×4): qty 1

## 2017-03-18 MED ORDER — ORAL CARE MOUTH RINSE
15.0000 mL | Freq: Two times a day (BID) | OROMUCOSAL | Status: DC
Start: 2017-03-18 — End: 2017-03-21
  Administered 2017-03-18 – 2017-03-21 (×7): 15 mL via OROMUCOSAL

## 2017-03-18 MED ORDER — MELATONIN 3 MG PO TABS
9.0000 mg | ORAL_TABLET | Freq: Every day | ORAL | Status: DC
  Administered 2017-03-18 – 2017-03-20 (×4): 9 mg via ORAL
  Filled 2017-03-18 (×4): qty 3

## 2017-03-18 MED ORDER — FUROSEMIDE 10 MG/ML IJ SOLN
40.0000 mg | Freq: Two times a day (BID) | INTRAMUSCULAR | Status: DC
  Filled 2017-03-18: qty 4

## 2017-03-18 MED ORDER — SPIRONOLACTONE 25 MG PO TABS
200.0000 mg | ORAL_TABLET | Freq: Every day | ORAL | Status: DC
  Administered 2017-03-18 – 2017-03-20 (×3): 200 mg via ORAL
  Filled 2017-03-18 (×4): qty 8

## 2017-03-18 MED ORDER — POTASSIUM CHLORIDE CRYS ER 20 MEQ PO TBCR
20.0000 meq | EXTENDED_RELEASE_TABLET | Freq: Every day | ORAL | Status: DC
  Administered 2017-03-19 – 2017-03-21 (×3): 20 meq via ORAL
  Filled 2017-03-18 (×4): qty 1

## 2017-03-18 MED ORDER — FERROUS SULFATE 325 (65 FE) MG PO TABS
325.0000 mg | ORAL_TABLET | Freq: Two times a day (BID) | ORAL | Status: DC
  Administered 2017-03-18 – 2017-03-21 (×7): 325 mg via ORAL
  Filled 2017-03-18 (×7): qty 1

## 2017-03-18 MED ORDER — PHYTONADIONE 5 MG PO TABS
10.0000 mg | ORAL_TABLET | Freq: Every day | ORAL | Status: DC
  Administered 2017-03-18 – 2017-03-21 (×4): 10 mg via ORAL
  Filled 2017-03-18 (×4): qty 2

## 2017-03-18 MED ORDER — IPRATROPIUM-ALBUTEROL 0.5-2.5 (3) MG/3ML IN SOLN
3.0000 mL | RESPIRATORY_TRACT | Status: DC | PRN
  Administered 2017-03-18: 3 mL via RESPIRATORY_TRACT
  Filled 2017-03-18: qty 3

## 2017-03-18 MED ORDER — ESCITALOPRAM OXALATE 10 MG PO TABS
10.0000 mg | ORAL_TABLET | Freq: Every day | ORAL | Status: DC
  Administered 2017-03-18 – 2017-03-21 (×4): 10 mg via ORAL
  Filled 2017-03-18 (×4): qty 1

## 2017-03-18 NOTE — Progress Notes (Addendum)
Initial Nutrition Assessment  DOCUMENTATION CODES:   Severe malnutrition in context of chronic illness  INTERVENTION:    Advance diet as medically appropriate, add supplements when/as able  NUTRITION DIAGNOSIS:   Malnutrition (severe) related to chronic illness (alcoholic liver disease) as evidenced by severe depletion of body fat, severe depletion of muscle mass, percent weight loss (12% x 2 months)  GOAL:   Patient will meet greater than or equal to 90% of their needs  MONITOR:   PO intake, Supplement acceptance, Labs, Weight trends, I & O's  REASON FOR ASSESSMENT:   Malnutrition Screening Tool  ASSESSMENT:   51 y.o. female with medical history significant of ETOH cirrhosis, hepatic hydrothorax, anxiety who presents for SOB.   Pt with shortness of breath during RD interview. Eyes are yellow. Reports she typically has an Ensure supplement for breakfast and then 2 smaller meals. She believes she's lost weight, however, unable to identify amount or time frame.    Stated "I was 144 and now I'm 120 something". Per readings, has had a 12% loss since 01/2017. Severe for time frame. Medications reviewed and include thiamine and ferrous sulfate. Labs reviewed. Na 128 (L). K 5.2 (H).  Nutrition-Focused physical exam completed. Findings are severe fat depletion, severe muscle depletion, and mild edema.   Diet Order:  Diet NPO time specified  Skin:  Reviewed, no issues  Last BM:  8/11  Height:   Ht Readings from Last 1 Encounters:  03/17/17 5' 8.5" (1.74 m)    Weight:   Wt Readings from Last 1 Encounters:  03/17/17 123 lb (55.8 kg)   Wt Readings from Last 10 Encounters:  03/17/17 123 lb (55.8 kg)  03/09/17 129 lb 6 oz (58.7 kg)  02/21/17 144 lb (65.3 kg)  02/01/17 143 lb (64.9 kg)  01/27/17 143 lb 12.8 oz (65.2 kg)  01/19/17 140 lb 14 oz (63.9 kg)    Ideal Body Weight:  63.6 kg  BMI:  Body mass index is 18.43 kg/m.  Estimated Nutritional Needs:   Kcal:   1700-1900  Protein:  95-105 fm  Fluid:  1.7-1.9 L  EDUCATION NEEDS:   No education needs identified at this time  Maureen ChattersKatie Tyrell Brereton, RD, LDN Pager #: [phone] After-Hours Pager #: [phone]

## 2017-03-18 NOTE — Progress Notes (Signed)
This nurse text paged Jordan HawksAmy Wertman PA, re: no procedure to be done today. No response from PA. This nurse then called GI after hours number and left message with operator that This nurse would like a call back re: pt and the procedure. An order was placed to call dr when pt gets back in room from Thoracentesis an since the pt will not have the procedure today I wanted to update them. No response from Dr on call as well.

## 2017-03-18 NOTE — Progress Notes (Addendum)
PROGRESS NOTE    Rhonda Haley   ZOX:[id number]  DOB: [birth year]  DOA: 03/17/2017 PCP: Lewis Moccasin, MD   Brief Narrative:  Rhonda Haley is a 51 y.o. female with medical history significant of ETOH cirrhosis, hepatic hydrothorax, anxiety who presents for SOB and enlarging abdomen.   She has been out of spironolactone for 1 wk now although her GI office appears to have received the message and made a note in the computer system to approver her for refills.   CXR shows large R pleural effusion. She underwent a thoracentesis on 02/21/17 and it was suspected that this was hepatic hydrothorax.     Subjective: A little short of breath today.  ROS: no complaints of nausea, vomiting, constipation diarrhea, cough, dyspnea or dysuria. No other complaints.   Assessment & Plan:   Principal Problem:   Hydrothorax- hepatic - Spironolactone resumed- cont Lasix - GI to evaluate  Active Problems:    Alcoholic cirrhosis of liver with ascites and elevated LFTs including severe hyperbilirubinuria  - as above- GI following as outpt    Normocytic anemia, not due to blood loss - on Iron supplements    DVT prophylaxis: SCDs Code Status: Full code Family Communication:  Disposition Plan: home when stable Consultants:   GI Procedures:   none Antimicrobials:  Anti-infectives    None       Objective: Vitals:   03/17/17 2330 03/18/17 0100 03/18/17 0201 03/18/17 0512  BP: (!) 107/59 108/79 (!) 101/56 (!) 96/53  Pulse: 96 97 99 (!) 102  Resp: (!) 30 (!) 24 (!) 22 (!) 26  Temp:   98.1 F (36.7 C) 98 F (36.7 C)  TempSrc:   Oral Oral  SpO2: 98% 95% 100% 100%  Weight:      Height:        Intake/Output Summary (Last 24 hours) at 03/18/17 1207 Last data filed at 03/18/17 8119  Gross per 24 hour  Intake                0 ml  Output              100 ml  Net             -100 ml   Filed Weights   03/17/17 1526  Weight: 55.8 kg (123 lb)    Examination: General exam: Appears  comfortable  HEENT: PERRLA, oral mucosa moist, + severe sclera icterus  Respiratory system: poor air entry in right lung field, Respiratory effort normal. Cardiovascular system: S1 & S2 heard, RRR.  No murmurs  Gastrointestinal system: Abdomen soft, non-tender,  Distended with fluid shift Normal bowel sound. No organomegaly Central nervous system: Alert and oriented. No focal neurological deficits. Extremities: No cyanosis, clubbing or edema Skin: No rashes or ulcers Psychiatry:  Mood & affect appropriate.     Data Reviewed: I have personally reviewed following labs and imaging studies  CBC:  Recent Labs Lab 03/14/17 1042 03/17/17 1530 03/18/17 0437  WBC 13.1* 10.2 10.4  NEUTROABS 10.0*  --   --   HGB 9.7* 8.7* 8.5*  HCT 28.8* 26.2* 25.2*  MCV 97.9 95.6 93.7  PLT 162.0 123* 123*   Basic Metabolic Panel:  Recent Labs Lab 03/14/17 1042 03/17/17 1530 03/18/17 0437  NA 125* 129* 128*  K 4.7 4.8 5.2*  CL 96 103 101  CO2 21 18* 20*  GLUCOSE 109* 119* 109*  BUN 11 13 14   CREATININE 0.74 0.82 0.96  CALCIUM 8.7 8.9 9.2  GFR: Estimated Creatinine Clearance: 59.7 mL/min (by C-G formula based on SCr of 0.96 mg/dL). Liver Function Tests:  Recent Labs Lab 03/14/17 1042 03/17/17 2107 03/18/17 0437  AST 193* 228* 217*  ALT 162* 194* 184*  ALKPHOS 128* 116 108  BILITOT 25.8* 30.0* 28.2*  PROT 8.1 9.1* 8.7*  ALBUMIN 2.6* 2.0* 1.9*   No results for input(s): LIPASE, AMYLASE in the last 168 hours. No results for input(s): AMMONIA in the last 168 hours. Coagulation Profile:  Recent Labs Lab 03/14/17 1042 03/17/17 2107 03/18/17 0437  INR 2.0* 2.18 2.09   Cardiac Enzymes: No results for input(s): CKTOTAL, CKMB, CKMBINDEX, TROPONINI in the last 168 hours. BNP (last 3 results) No results for input(s): PROBNP in the last 8760 hours. HbA1C: No results for input(s): HGBA1C in the last 72 hours. CBG: No results for input(s): GLUCAP in the last 168 hours. Lipid  Profile: No results for input(s): CHOL, HDL, LDLCALC, TRIG, CHOLHDL, LDLDIRECT in the last 72 hours. Thyroid Function Tests: No results for input(s): TSH, T4TOTAL, FREET4, T3FREE, THYROIDAB in the last 72 hours. Anemia Panel: No results for input(s): VITAMINB12, FOLATE, FERRITIN, TIBC, IRON, RETICCTPCT in the last 72 hours. Urine analysis: No results found for: COLORURINE, APPEARANCEUR, LABSPEC, PHURINE, GLUCOSEU, HGBUR, BILIRUBINUR, KETONESUR, PROTEINUR, UROBILINOGEN, NITRITE, LEUKOCYTESUR Sepsis Labs: @LABRCNTIP (procalcitonin:4,lacticidven:4) )No results found for this or any previous visit (from the past 240 hour(s)).       Radiology Studies: Dg Chest 2 View  Result Date: 03/17/2017 CLINICAL DATA:  Shortness of breath. EXAM: CHEST  2 VIEW COMPARISON:  Chest x-ray 02/21/2017 FINDINGS: Large right-sided pleural effusion with overlying atelectasis. The left lung is relatively clear. The cardiac silhouette, mediastinal and hilar contours are grossly normal and stable. The bony thorax is intact. IMPRESSION: Large right pleural effusion. Electronically Signed   By: Rudie MeyerP.  Gallerani M.D.   On: 03/17/2017 15:55      Scheduled Meds: . escitalopram  10 mg Oral Daily  . ferrous sulfate  325 mg Oral BID WC  . furosemide  40 mg Intravenous Q12H  . mouth rinse  15 mL Mouth Rinse BID  . Melatonin  9 mg Oral QHS  . pantoprazole  20 mg Oral QAC breakfast  . potassium chloride SA  20 mEq Oral Daily  . spironolactone  200 mg Oral Daily  . thiamine  100 mg Oral Daily   Continuous Infusions:   LOS: 0 days    Time spent in minutes: 35    Calvert CantorSaima Oniel Meleski, MD [facility] Pager: [email or web address] Password [email or web address] 03/18/2017, 12:07 PM

## 2017-03-18 NOTE — Consult Note (Signed)
Consultation  Referring Provider:  [facility]/ [facility]  Primary Care Physician:  Rhonda Moccasinewey, Elizabeth R, MD Primary Gastroenterologist:  Dr.Regena Haley  Reason for Consultation:  Decompensated cirrhosis with large right effusion  IMPRESSION:   #751 51 year old white female alcoholic with decompensated cirrhosis and alcoholic hepatitis. Patient stopped drinking 6-7 weeks ago.Admitted last evening with progressive shortness of breath after running out of both of her diuretics over at least the past 1 week. Denies active drinking Current MELD =31 DF = 73 Patient has had further decline in hepatic function, and now meets criteria for steroids for acute alcoholic hepatitis. #2 recurrent large right pleural effusion #3 ascites #4worsening coagulopathy #5 normocytic anemia   PLAN: #1 scheduled for IR thoracentesis today #2 strict 2 g sodium diet #3 IV Lasix 40 twice a day was started on admission, continue #4 restart Aldactone 100 mg twice daily #5 start prednisolone 40 mg by mouth daily 30 days #6 we will also schedule for paracentesis, primarily diagnostic, would not remove greater than 1 L, to rule out any component of SBP since she has had decompensation. #7 patient will need to be monitored closely over the next several days, hopefully will not progress to overt hepatic failure-we will follow with you.    Rhonda Haley  03/18/2017, 11:57 AM    [facility] Attending   I have taken an interval history, reviewed the chart and examined the patient. I agree with the Advanced Practitioner's note, impression and recommendations.   The patient is very seriously ill with decompensated alcoholic cirrhosis and probably hepatitis from EtOH as well - mildly + autoimmune markers unclear significance. Current situation likely aggravated by being off diuretics.Prior MRI from July also reviewed   Though do not wabnt a chest tube do think thoracentesis makes sense and dx paracentesis. Treat  with prednisolone 40 mg qd Will try to titrate up on diuretics need to maximize to control hepatic hydrothorak. If this fails consider TIPS Could need a liver bx to understand things but let's see how she does with steroids and try to avoid that.  She looks and is malnourished also. - Dietitian to see and advise supplements  I will need to get her seen by hepatology as outpatient (depending upon clinical course)  IV albumin after thoracentesis and paracentesis - I will order  Rhonda Booparl E. Daielle Melcher, MD, Rhonda Haley [phone] (pager) 03/18/2017 1:34 PM       HPI: Rhonda Haley is a 51 y.o. female , known to Dr. Leone Haley is relatively recent diagnosis of EtOH induced cirrhosis, decompensated. She was admitted through the emergency room last evening after she presented with progressive shortness of breath. Patient was hospitalized in mid July with hepatic hydrothorax and required thoracentesis  of 2 L. Fluid was not infected. She has been on high-dose diuretics with Lasix 40 twice a day and Aldactone 200 mg by mouth daily at home. She has not had any EtOH in the past 6-7 weeks. Apparently she ran out of Aldactone about a week ago and since has developed increasing shortness of breath.We looked at her meds together and she actually has been out of Lasix and Aldactone  for at least a week. Has had increase in abdominal girth  as well- weight up 10 pounds. Very SOB with walking, cannot lay flat . Chest x-ray on admission last evening with large right pleural effusion. Labs reviewed, sodium 129,, creatinine 0.82, T bili 30 -higher than last admission, AST 228, ALT of 194 WBC 10.2, hemoglobin  8.7 stable platelets 123, PT 24.6/INR 2.18   Previously checked chronic hepatic markers had shown a weakly positive A NA and weakly positive smooth muscle antibody, unclear if clinically significant.   Past Medical History:  Diagnosis Date  . Alcoholic liver disease (HCC) 01/19/2017  . Allergy     . Anxiety   . Blood transfusion without reported diagnosis   . Symptomatic anemia 01/19/2017    Past Surgical History:  Procedure Laterality Date  . FERTILITY SURGERY  2003   in vitro fertilization  . IR THORACENTESIS ASP PLEURAL SPACE W/IMG GUIDE  02/21/2017  . WISDOM TOOTH EXTRACTION     age 26    Prior to Admission medications   Medication Sig Start Date End Date Taking? Authorizing Provider  escitalopram (LEXAPRO) 20 MG tablet Take 20 mg by mouth daily. 01/18/17  Yes [provider]  ferrous sulfate 325 (65 FE) MG tablet Take 1 tablet (325 mg total) by mouth 2 (two) times daily with a meal. 03/17/17  Yes Zehr, Shanda Bumps D, PA-C  furosemide (LASIX) 40 MG tablet Take 1 tablet (40 mg total) by mouth 2 (two) times daily. 02/24/17 03/17/17 Yes Beola Cord, MD  Melatonin 10 MG TABS Take 10 mg by mouth at bedtime.   Yes [provider]  pantoprazole (PROTONIX) 20 MG tablet Take 1 tablet (20 mg total) by mouth daily before breakfast. 02/01/17  Yes Rhonda Boop, MD  potassium chloride SA (K-DUR,KLOR-CON) 20 MEQ tablet Take 1 tablet (20 mEq total) by mouth daily. 02/24/17 03/17/17 Yes Beola Cord, MD  spironolactone (ALDACTONE) 100 MG tablet Take 2 tablets (200 mg total) by mouth daily. 03/17/17 03/27/17 Yes Zehr, Princella Pellegrini, PA-C  thiamine (VITAMIN B-1) 100 MG tablet Take 1 tablet (100 mg total) by mouth daily. 03/17/17  Yes Zehr, Princella Pellegrini, PA-C  feeding supplement, ENSURE ENLIVE, (ENSURE ENLIVE) LIQD Take 237 mLs by mouth 2 (two) times daily between meals. Patient not taking: Reported on 03/17/2017 01/21/17   Maretta Bees, MD  loperamide (IMODIUM A-D) 2 MG tablet Take 1 tablet (2 mg total) by mouth 3 (three) times daily as needed for diarrhea or loose stools. Patient not taking: Reported on 03/17/2017 01/20/17   Maretta Bees, MD    Current Facility-Administered Medications  Medication Dose Route Frequency Provider Last Rate Last Dose  . escitalopram (LEXAPRO)  tablet 10 mg  10 mg Oral Daily Inez Catalina, MD   10 mg at 03/18/17 0916  . ferrous sulfate tablet 325 mg  325 mg Oral BID WC Debe Coder B, MD   325 mg at 03/18/17 0916  . furosemide (LASIX) injection 40 mg  40 mg Intravenous Q12H Rizwan, Ladell Heads, MD      . ipratropium-albuterol (DUONEB) 0.5-2.5 (3) MG/3ML nebulizer solution 3 mL  3 mL Nebulization Q4H PRN Inez Catalina, MD      . MEDLINE mouth rinse  15 mL Mouth Rinse BID Debe Coder B, MD   15 mL at 03/18/17 0916  . Melatonin TABS 9 mg  9 mg Oral QHS Debe Coder B, MD   9 mg at 03/18/17 0228  . pantoprazole (PROTONIX) EC tablet 20 mg  20 mg Oral QAC breakfast Debe Coder B, MD   20 mg at 03/18/17 0916  . potassium chloride SA (K-DUR,KLOR-CON) CR tablet 20 mEq  20 mEq Oral Daily Debe Coder B, MD      . spironolactone (ALDACTONE) tablet 200 mg  200 mg Oral Daily Calvert Cantor, MD  200 mg at 03/18/17 0227  . thiamine (VITAMIN B-1) tablet 100 mg  100 mg Oral Daily Debe Coder B, MD   100 mg at 03/18/17 0916    Allergies as of 03/17/2017  . (No Known Allergies)    Family History  Problem Relation Age of Onset  . Brain cancer Mother   . Colon cancer Neg Hx   . Esophageal cancer Neg Hx   . Rectal cancer Neg Hx   . Stomach cancer Neg Hx     Social History   Social History  . Marital status: Married    Spouse name: N/A  . Number of children: N/A  . Years of education: N/A   Occupational History  . Not on file.   Social History Main Topics  . Smoking status: Former Smoker    Packs/day: 0.50    Years: 28.00    Types: Cigarettes    Quit date: 03/03/2017  . Smokeless tobacco: Never Used  . Alcohol use No     Comment: quit x 6 wks ago  . Drug use: No  . Sexual activity: Yes     Review of Systems: Pertinent positive and negative review of systems were noted in the above HPI section.  All other review of systems was otherwise negative.  Physical Exam: Vital signs in last 24 hours: Temp:  [97.8 F (36.6  C)-98.1 F (36.7 C)] 98 F (36.7 C) (08/11 0512) Pulse Rate:  [79-102] 102 (08/11 0512) Resp:  [16-31] 26 (08/11 0512) BP: (96-108)/(49-79) 96/53 (08/11 0512) SpO2:  [94 %-100 %] 100 % (08/11 0512) Weight:  [123 lb (55.8 kg)] 123 lb (55.8 kg) (08/10 1526) Last BM Date: 03/18/17 General:   Alert,  Well-developed, acute and chronically ill-appearing very thin jaundiced white female Head:  Normocephalic and atraumatic. Eyes:  Sclera  Very icteric    Conjunctiva pink. Ears:  Normal auditory acuity. Nose:  No deformity, discharge,  or lesions. Mouth:  No deformity or lesions.   Neck:  Supple; no masses or thyromegaly. Lungs: significantly decreased BS right lung Heart:  Regular rate and rhythm; systolic murmur Abdomen:  Soft,BS + nontense ascites , liver enlarged -palp 4 FB below RCM Msk:  Symmetrical without gross deformities. . Pulses:  Normal pulses noted. Extremities:  Trace edema Neurologic:  Alert and  oriented x4;  grossly normal neurologically. Skin:  Jaundiced..Multiple spider angiomata Psych:  Alert and cooperative. Normal mood and affect.  Lab Results:  Recent Labs  03/17/17 1530 03/18/17 0437  WBC 10.2 10.4  HGB 8.7* 8.5*  HCT 26.2* 25.2*  PLT 123* 123*   BMET  Recent Labs  03/17/17 1530 03/18/17 0437  NA 129* 128*  K 4.8 5.2*  CL 103 101  CO2 18* 20*  GLUCOSE 119* 109*  BUN 13 14  CREATININE 0.82 0.96  CALCIUM 8.9 9.2   LFT  Recent Labs  03/17/17 2107 03/18/17 0437  PROT 9.1* 8.7*  ALBUMIN 2.0* 1.9*  AST 228* 217*  ALT 194* 184*  ALKPHOS 116 108  BILITOT 30.0* 28.2*  BILIDIR 17.5*  --   IBILI 12.5*  --    PT/INR  Recent Labs  03/17/17 2107 03/18/17 0437  LABPROT 24.6* 23.8*  INR 2.18 2.09

## 2017-03-18 NOTE — H&P (Signed)
History and Physical    Rhonda Haley ZOX:[id number] DOB: 1965/11/13 DOA: 03/17/2017  PCP: Rhonda Moccasinewey, Elizabeth R, MD  Patient coming from: Home  Chief Complaint: SOB  HPI: Rhonda FernsJoan Stanger is a 51 y.o. female with medical history significant of ETOH cirrhosis, hepatic hydrothorax, anxiety who presents for SOB.  [name] reports that she ran out of her spironolactone 1 week ago and since that time has developed worsening SOB, abdominal swelling, cough and chest tightness.  She feels very similar to when she was in the hospital for hepatic hydrothorax about 3 weeks ago.  She has no swelling in her legs.  She has some mild confusion which she describes as "foggy headed."  She has a cough, has not been on oxygen. Denies dysphagia, reflux, nausea, vomiting, abdominal pain.  She does note that her skin has been getting yellower and her eyes are also yellow.  She denies chest pain, urinary issues, change in bowel movements, LE edema, fever, chills.  She denies pruritis except occasionally.    ED Course: In the ED, she was found to have a Na of 129, AST of 228, ALT of 194, T bili of 30, D Bili of 17.5, Indirect bilirubin 12.5.  These are worsening compared to 8/7.  She has a WBC of 10.2, H/H of 8.7, HCT of 26.2, platelets of 123.  CXR shows re-accumulation of right sided pleural effusion.    Review of Systems: As per HPI otherwise 10 point review of systems negative.   Past Medical History:  Diagnosis Date  . Alcoholic liver disease (HCC) 01/19/2017  . Allergy   . Anxiety   . Blood transfusion without reported diagnosis   . Symptomatic anemia 01/19/2017    Past Surgical History:  Procedure Laterality Date  . FERTILITY SURGERY  2003   in vitro fertilization  . IR THORACENTESIS ASP PLEURAL SPACE W/IMG GUIDE  02/21/2017  . WISDOM TOOTH EXTRACTION     age 89   Reviewed with the patient.    reports that she quit smoking about 2 weeks ago. Her smoking use included Cigarettes. She has a 14.00 pack-year smoking  history. She has never used smokeless tobacco. She reports that she does not drink alcohol or use drugs.  No Known Allergies  Reviewed with the patient.  She reports her father is healthy.   Family History  Problem Relation Age of Onset  . Brain cancer Mother   . Colon cancer Neg Hx   . Esophageal cancer Neg Hx   . Rectal cancer Neg Hx   . Stomach cancer Neg Hx     She has not been taking her spironolactone.  She is not sure if she is taking her lasix.  Prior to Admission medications   Medication Sig Start Date End Date Taking? Authorizing Provider  escitalopram (LEXAPRO) 20 MG tablet Take 20 mg by mouth daily. 01/18/17  Yes [provider]  ferrous sulfate 325 (65 FE) MG tablet Take 1 tablet (325 mg total) by mouth 2 (two) times daily with a meal. 03/17/17  Yes Zehr, Shanda BumpsJessica D, PA-C  furosemide (LASIX) 40 MG tablet Take 1 tablet (40 mg total) by mouth 2 (two) times daily. 02/24/17 03/17/17 Yes Beola CordMelvin, Alexander, MD  Melatonin 10 MG TABS Take 10 mg by mouth at bedtime.   Yes [provider]  pantoprazole (PROTONIX) 20 MG tablet Take 1 tablet (20 mg total) by mouth daily before breakfast. 02/01/17  Yes Iva BoopGessner, Carl E, MD  potassium chloride SA (K-DUR,KLOR-CON) 20 MEQ  tablet Take 1 tablet (20 mEq total) by mouth daily. 02/24/17 03/17/17 Yes Beola Cord, MD  spironolactone (ALDACTONE) 100 MG tablet Take 2 tablets (200 mg total) by mouth daily. 03/17/17 03/27/17 Yes Zehr, Princella Pellegrini, PA-C  thiamine (VITAMIN B-1) 100 MG tablet Take 1 tablet (100 mg total) by mouth daily. 03/17/17  Yes Zehr, Princella Pellegrini, PA-C  feeding supplement, ENSURE ENLIVE, (ENSURE ENLIVE) LIQD Take 237 mLs by mouth 2 (two) times daily between meals. Patient not taking: Reported on 03/17/2017 01/21/17   Maretta Bees, MD  loperamide (IMODIUM A-D) 2 MG tablet Take 1 tablet (2 mg total) by mouth 3 (three) times daily as needed for diarrhea or loose stools. Patient not taking: Reported on 03/17/2017 01/20/17    Maretta Bees, MD    Physical Exam: Vitals:   03/17/17 2130 03/17/17 2145 03/17/17 2300 03/17/17 2330  BP: 105/63 98/61 102/67 (!) 107/59  Pulse: 92 91 92 96  Resp: (!) 25 (!) 31 (!) 24 (!) 30  Temp:      TempSrc:      SpO2: 98% 95% 98% 98%  Weight:      Height:        Constitutional: Sitting upright in bed, somewhat short of breath with long sentences, audible wheezing Vitals:   03/17/17 2130 03/17/17 2145 03/17/17 2300 03/17/17 2330  BP: 105/63 98/61 102/67 (!) 107/59  Pulse: 92 91 92 96  Resp: (!) 25 (!) 31 (!) 24 (!) 30  Temp:      TempSrc:      SpO2: 98% 95% 98% 98%  Weight:      Height:       Eyes: PERRL, significant scleral icterus, some conjunctival injection.  ENMT: Mucous membranes are moist. Posterior pharynx clear of any exudate or lesions. + under tongue yellowing.   Neck: normal, supple Respiratory: She has no breath sounds to the apex on the right.  She has clear lung sounds to the base on the right.  She has upper airway sounds.   Cardiovascular: Regular rate and normal rhythm, no murmurs / rubs / gallops. Mild extremity edema, non pitting.  Abdomen: NT, + distention but not tense.  +BS Musculoskeletal: Mild clubbing of the fingers, no extremity deformity.  Skin: She has significant jaundice, some mild palmar erythema, + spider angiomas on chest.  No rash or wounds.  Neurologic: Grossly intact, strength intact in all extremities. She has no asterixis.  Psychiatric: Normal judgment and insight. Alert and oriented x 3. Normal mood.    Labs on Admission: I have personally reviewed following labs and imaging studies  CBC:  Recent Labs Lab 03/14/17 1042 03/17/17 1530  WBC 13.1* 10.2  NEUTROABS 10.0*  --   HGB 9.7* 8.7*  HCT 28.8* 26.2*  MCV 97.9 95.6  PLT 162.0 123*   Basic Metabolic Panel:  Recent Labs Lab 03/14/17 1042 03/17/17 1530  NA 125* 129*  K 4.7 4.8  CL 96 103  CO2 21 18*  GLUCOSE 109* 119*  BUN 11 13  CREATININE 0.74 0.82    CALCIUM 8.7 8.9   GFR: Estimated Creatinine Clearance: 69.9 mL/min (by C-G formula based on SCr of 0.82 mg/dL). Liver Function Tests:  Recent Labs Lab 03/14/17 1042 03/17/17 2107  AST 193* 228*  ALT 162* 194*  ALKPHOS 128* 116  BILITOT 25.8* 30.0*  PROT 8.1 9.1*  ALBUMIN 2.6* 2.0*   No results for input(s): LIPASE, AMYLASE in the last 168 hours. No results for input(s): AMMONIA in the  last 168 hours. Coagulation Profile:  Recent Labs Lab 03/14/17 1042 03/17/17 2107  INR 2.0* 2.18   Cardiac Enzymes: No results for input(s): CKTOTAL, CKMB, CKMBINDEX, TROPONINI in the last 168 hours. BNP (last 3 results) No results for input(s): PROBNP in the last 8760 hours. HbA1C: No results for input(s): HGBA1C in the last 72 hours. CBG: No results for input(s): GLUCAP in the last 168 hours. Lipid Profile: No results for input(s): CHOL, HDL, LDLCALC, TRIG, CHOLHDL, LDLDIRECT in the last 72 hours. Thyroid Function Tests: No results for input(s): TSH, T4TOTAL, FREET4, T3FREE, THYROIDAB in the last 72 hours. Anemia Panel: No results for input(s): VITAMINB12, FOLATE, FERRITIN, TIBC, IRON, RETICCTPCT in the last 72 hours. Urine analysis: No results found for: COLORURINE, APPEARANCEUR, LABSPEC, PHURINE, GLUCOSEU, HGBUR, BILIRUBINUR, KETONESUR, PROTEINUR, UROBILINOGEN, NITRITE, LEUKOCYTESUR  Radiological Exams on Admission: Dg Chest 2 View  Result Date: 03/17/2017 CLINICAL DATA:  Shortness of breath. EXAM: CHEST  2 VIEW COMPARISON:  Chest x-ray 02/21/2017 FINDINGS: Large right-sided pleural effusion with overlying atelectasis. The left lung is relatively clear. The cardiac silhouette, mediastinal and hilar contours are grossly normal and stable. The bony thorax is intact. IMPRESSION: Large right pleural effusion. Electronically Signed   By: Rudie Meyer M.D.   On: 03/17/2017 15:55    EKG: Independently reviewed. Normal sinus rhythm, wandering baseline.   Assessment/Plan Alcoholic  cirrhosis of liver with ascites and hydrothorax, hyperbilirubinemia, worsening elevated LFTs - She reports that she has missed her dose of spironolactone for 1 week, which would explain her worsening fluid overload - Would recommend IR consult early in the AM for thoracentesis and possibly paracentesis - GI consult in the AM, she is a patient of [facility] and Dr. Leone Payor is on in the morning.  - Reviewed last GI note, and they were considering steroids, would ask Dr. Leone Payor if this is appropriate in her situation - restart spironolactone - IV lasix at 40mg  BID, first dose tonight.  - Oxygen therapy, continuous pulse ox.  - She has abnormal anti-centromere antibody - this can be associated with PBC in 15% of patients.  - She reports abstinence from ETOH for 7 weeks.   - MELD score is 31 based on current values - giving a 52% of 3 month mortality - Child Pugh score based on current values is Class C - NPO at 4am    Normocytic anemia, not due to blood loss - H/H seem stable compared to previous numbers, she has no blood loss reported today.   - EGD in June does not report esophageal varices - Trend H/H    Hyponatremia   - Likely related to volume overload, improved from last check - Trend  Anxiety  - Lexapro 10mg  daily as recommended for hepatic compromise.  (decrease from home dose of 20mg )  DVT prophylaxis: SCDs only Code Status: Full Disposition Plan: Admit for IR evaluation, GI evaluation Consults called: would call IR and GI in the AM Admission status: admit to telemetry, inpatient.    Debe Coder MD [facility] Pager [phone]  If 7PM-7AM, please contact night-coverage [email or web address] Password [facility]  03/18/2017, 12:42 AM

## 2017-03-19 ENCOUNTER — Inpatient Hospital Stay (HOSPITAL_COMMUNITY): Payer: 59

## 2017-03-19 DIAGNOSIS — Z9889 Other specified postprocedural states: Secondary | ICD-10-CM

## 2017-03-19 LAB — GLUCOSE, PLEURAL OR PERITONEAL FLUID: GLUCOSE FL: 145 mg/dL

## 2017-03-19 LAB — COMPREHENSIVE METABOLIC PANEL
ALBUMIN: 1.8 g/dL — AB (ref 3.5–5.0)
ALBUMIN: 1.7 g/dL — AB (ref 3.5–5.0)
ALT: 158 U/L — ABNORMAL HIGH (ref 14–54)
ALT: 147 U/L — ABNORMAL HIGH (ref 14–54)
ANION GAP: 10 (ref 5–15)
ANION GAP: 11 (ref 5–15)
AST: 160 U/L — ABNORMAL HIGH (ref 15–41)
AST: 135 U/L — ABNORMAL HIGH (ref 15–41)
Alkaline Phosphatase: 111 U/L (ref 38–126)
Alkaline Phosphatase: 106 U/L (ref 38–126)
BILIRUBIN TOTAL: 28.4 mg/dL — AB (ref 0.3–1.2)
BUN: 20 mg/dL (ref 6–20)
BUN: 25 mg/dL — ABNORMAL HIGH (ref 6–20)
CALCIUM: 8.7 mg/dL — AB (ref 8.9–10.3)
CHLORIDE: 99 mmol/L — AB (ref 101–111)
CO2: 18 mmol/L — AB (ref 22–32)
CO2: 20 mmol/L — ABNORMAL LOW (ref 22–32)
Calcium: 8.7 mg/dL — ABNORMAL LOW (ref 8.9–10.3)
Chloride: 98 mmol/L — ABNORMAL LOW (ref 101–111)
Creatinine, Ser: 1.3 mg/dL — ABNORMAL HIGH (ref 0.44–1.00)
Creatinine, Ser: 1.37 mg/dL — ABNORMAL HIGH (ref 0.44–1.00)
GFR calc Af Amer: 50 mL/min — ABNORMAL LOW (ref >60)
GFR calc non Af Amer: 46 mL/min — ABNORMAL LOW (ref >60)
GFR calc non Af Amer: 43 mL/min — ABNORMAL LOW (ref >60)
GFR, EST AFRICAN AMERICAN: 53 mL/min — AB (ref >60)
GLUCOSE: 141 mg/dL — AB (ref 65–99)
GLUCOSE: 162 mg/dL — AB (ref 65–99)
POTASSIUM: 4.2 mmol/L (ref 3.5–5.1)
Potassium: 4.9 mmol/L (ref 3.5–5.1)
SODIUM: 127 mmol/L — AB (ref 135–145)
Sodium: 129 mmol/L — ABNORMAL LOW (ref 135–145)
TOTAL PROTEIN: 8.6 g/dL — AB (ref 6.5–8.1)
Total Bilirubin: 28.4 mg/dL (ref 0.3–1.2)
Total Protein: 8.5 g/dL — ABNORMAL HIGH (ref 6.5–8.1)

## 2017-03-19 LAB — PROTEIN, PLEURAL OR PERITONEAL FLUID: Total protein, fluid: <3 g/dL

## 2017-03-19 LAB — CBC
HCT: 24.1 % — ABNORMAL LOW (ref 36.0–46.0)
Hemoglobin: 8.3 g/dL — ABNORMAL LOW (ref 12.0–15.0)
MCH: 32.4 pg (ref 26.0–34.0)
MCHC: 34.4 g/dL (ref 30.0–36.0)
MCV: 94.1 fL (ref 78.0–100.0)
PLATELETS: 117 10*3/uL — AB (ref 150–400)
RBC: 2.56 MIL/uL — AB (ref 3.87–5.11)
RDW: 17 % — ABNORMAL HIGH (ref 11.5–15.5)
WBC: 9.4 10*3/uL (ref 4.0–10.5)

## 2017-03-19 LAB — PROTIME-INR
INR: 2.03
PROTHROMBIN TIME: 23.3 s — AB (ref 11.4–15.2)

## 2017-03-19 LAB — BODY FLUID CELL COUNT WITH DIFFERENTIAL
EOS FL: 0 %
LYMPHS FL: 58 %
Monocyte-Macrophage-Serous Fluid: 31 % — ABNORMAL LOW (ref 50–90)
NEUTROPHIL FLUID: 11 % (ref 0–25)
WBC FLUID: 82 uL (ref 0–1000)

## 2017-03-19 MED ORDER — FUROSEMIDE 40 MG PO TABS
40.0000 mg | ORAL_TABLET | Freq: Two times a day (BID) | ORAL | Status: DC
  Administered 2017-03-20: 40 mg via ORAL
  Filled 2017-03-19: qty 1

## 2017-03-19 MED ORDER — LIDOCAINE HCL (PF) 1 % IJ SOLN
INTRAMUSCULAR | Status: AC
  Filled 2017-03-19: qty 30

## 2017-03-19 NOTE — Procedures (Signed)
R thoracentesis 2.6 L CXR

## 2017-03-19 NOTE — Progress Notes (Signed)
   Patient Name: Rhonda Haley Date of Encounter: 03/19/2017, 1:53 PM    Subjective  sleeping Had 2.6 L thoracentesis - looks like transudate  Objective  BP (!) 93/47 (BP Location: Left Arm)   Pulse 91   Temp 97.9 F (36.6 C) (Oral)   Resp 18   Ht 5' 8.5" (1.74 m)   Wt 129 lb 3.2 oz (58.6 kg)   SpO2 98%   BMI 19.36 kg/m  Remains jaundiced  Lab Results  Component Value Date   INR 2.03 03/19/2017   INR 2.09 03/18/2017   INR 2.18 03/17/2017   Lab Results  Component Value Date   ALT 158 (H) 03/19/2017   AST 160 (H) 03/19/2017   ALKPHOS 111 03/19/2017   BILITOT 28.4 (HH) 03/19/2017   Lab Results  Component Value Date   CREATININE 1.30 (H) 03/19/2017   BUN 20 03/19/2017   NA 127 (L) 03/19/2017   K 4.9 03/19/2017   CL 99 (L) 03/19/2017   CO2 18 (L) 03/19/2017     Assessment and Plan   #24 51 year old white female alcoholic with decompensated cirrhosis and alcoholic hepatitis.  Current MELD =31 DF = 73 Patient has had further decline in hepatic function, and now meets criteria for steroids for acute alcoholic hepatitis. #2 recurrent large right pleural effusion #3 ascites #4worsening coagulopathy #5 normocytic anemia  Stable to improved today (after thoracentesis) but creatinine hiher  I let her rest   We will see again tomorrow - she is very sick - hope prednisolone makes a difference. Need to clarify drinking - she could need a rehab stay somewhere if she would do it - declined when I first met her - and if medically able  Gatha Mayer, MD, [facility] [phone] (pager) 03/19/2017 1:53 PM

## 2017-03-19 NOTE — Progress Notes (Signed)
PROGRESS NOTE    Rhonda Haley   WUJ:[id number]  DOB: Oct 19, 1965  DOA: 03/17/2017 PCP: Lewis Moccasin, MD   Brief Narrative:  Rhonda Haley is a 51 y.o. female with medical history significant of ETOH cirrhosis, hepatic hydrothorax, anxiety who presents for SOB and enlarging abdomen.   She has been out of spironolactone for 1 wk now although her GI office appears to have received the message and made a note in the computer system to approver her for refills.   CXR shows large R pleural effusion. She underwent a thoracentesis on 02/21/17 and it was suspected that this was hepatic hydrothorax.     Subjective:  Feels less short of breath after the thoracentesis.  ROS: no complaints of nausea, vomiting, constipation diarrhea, cough, dyspnea or dysuria. No other complaints.   Assessment & Plan:   Principal Problem:   Hydrothorax- hepatic - Spironolactone resumed- cont Lasix - s/p thoracentesis today- 2.6 L, transudative- wean O2 as able- she states pOx still drops to 80s when she walks to the bathroom- need nasal cannula extension for when she ambulates - start IS as well  Active Problems:    Alcoholic cirrhosis of liver with ascites and elevated LFTs including severe hyperbilirubinuria  -  GI following - prednisolone started due to severity of hepatitis - paracentesis attempted but she has no ascites at this time    Normocytic anemia, not due to blood loss - on Iron supplements    DVT prophylaxis: SCDs Code Status: Full code Family Communication:  Disposition Plan: home when stable Consultants:   GI Procedures:   none Antimicrobials:  Anti-infectives    None       Objective: Vitals:   03/19/17 0500 03/19/17 1041 03/19/17 1052 03/19/17 1614  BP:  (!) 110/57 (!) 93/47 101/61  Pulse:    98  Resp:    16  Temp:    98.1 F (36.7 C)  TempSrc:    Oral  SpO2:  98%  97%  Weight: 58.6 kg (129 lb 3.2 oz)     Height:        Intake/Output Summary (Last 24 hours) at  03/19/17 1741 Last data filed at 03/19/17 1620  Gross per 24 hour  Intake                0 ml  Output              900 ml  Net             -900 ml   Filed Weights   03/17/17 1526 03/19/17 0500  Weight: 55.8 kg (123 lb) 58.6 kg (129 lb 3.2 oz)    Examination:  General exam: Appears comfortable  HEENT: PERRLA, oral mucosa moist, ++ sclera icterus   Respiratory system: Clear to auscultation. Respiratory effort normal. Cardiovascular system: S1 & S2 heard,  No murmurs  Gastrointestinal system: Abdomen soft, non-tender, nondistended. Normal bowel sound. No organomegaly Central nervous system: Alert and oriented. No focal neurological deficits. Extremities: No cyanosis, clubbing or edema Skin: No rashes or ulcers Psychiatry:  Mood & affect appropriate.     Data Reviewed: I have personally reviewed following labs and imaging studies  CBC:  Recent Labs Lab 03/14/17 1042 03/17/17 1530 03/18/17 0437 03/19/17 0400  WBC 13.1* 10.2 10.4 9.4  NEUTROABS 10.0*  --   --   --   HGB 9.7* 8.7* 8.5* 8.3*  HCT 28.8* 26.2* 25.2* 24.1*  MCV 97.9 95.6 93.7 94.1  PLT 162.0 123*  123* 117*   Basic Metabolic Panel:  Recent Labs Lab 03/14/17 1042 03/17/17 1530 03/18/17 0437 03/18/17 1241 03/19/17 0400  NA 125* 129* 128* 127* 127*  K 4.7 4.8 5.2* 5.4* 4.9  CL 96 103 101 101 99*  CO2 21 18* 20* 18* 18*  GLUCOSE 109* 119* 109* 85 141*  BUN 11 13 14 16 20   CREATININE 0.74 0.82 0.96 1.12* 1.30*  CALCIUM 8.7 8.9 9.2 9.0 8.7*   GFR: Estimated Creatinine Clearance: 46.3 mL/min (A) (by C-G formula based on SCr of 1.3 mg/dL (H)). Liver Function Tests:  Recent Labs Lab 03/14/17 1042 03/17/17 2107 03/18/17 0437 03/18/17 1241 03/19/17 0400  AST 193* 228* 217* 207* 160*  ALT 162* 194* 184* 172* 158*  ALKPHOS 128* 116 108 99 111  BILITOT 25.8* 30.0* 28.2* 29.6* 28.4*  PROT 8.1 9.1* 8.7* 8.5* 8.5*  ALBUMIN 2.6* 2.0* 1.9* 2.0* 1.8*   No results for input(s): LIPASE, AMYLASE in the last  168 hours.  Recent Labs Lab 03/18/17 1325  AMMONIA 22   Coagulation Profile:  Recent Labs Lab 03/14/17 1042 03/17/17 2107 03/18/17 0437 03/19/17 0400  INR 2.0* 2.18 2.09 2.03   Cardiac Enzymes: No results for input(s): CKTOTAL, CKMB, CKMBINDEX, TROPONINI in the last 168 hours. BNP (last 3 results) No results for input(s): PROBNP in the last 8760 hours. HbA1C: No results for input(s): HGBA1C in the last 72 hours. CBG: No results for input(s): GLUCAP in the last 168 hours. Lipid Profile: No results for input(s): CHOL, HDL, LDLCALC, TRIG, CHOLHDL, LDLDIRECT in the last 72 hours. Thyroid Function Tests: No results for input(s): TSH, T4TOTAL, FREET4, T3FREE, THYROIDAB in the last 72 hours. Anemia Panel: No results for input(s): VITAMINB12, FOLATE, FERRITIN, TIBC, IRON, RETICCTPCT in the last 72 hours. Urine analysis: No results found for: COLORURINE, APPEARANCEUR, LABSPEC, PHURINE, GLUCOSEU, HGBUR, BILIRUBINUR, KETONESUR, PROTEINUR, UROBILINOGEN, NITRITE, LEUKOCYTESUR Sepsis Labs: @LABRCNTIP (procalcitonin:4,lacticidven:4) ) Recent Results (from the past 240 hour(s))  Gram stain     Status: None (Preliminary result)   Collection Time: 03/19/17 11:02 AM  Result Value Ref Range Status   Specimen Description PLEURAL RIGHT  Final   Special Requests NONE  Final   Gram Stain   Final    RARE WBC PRESENT, PREDOMINANTLY MONONUCLEAR NO ORGANISMS SEEN    Report Status PENDING  Incomplete         Radiology Studies: Dg Chest 1 View  Result Date: 03/19/2017 CLINICAL DATA:  Thoracentesis EXAM: CHEST 1 VIEW COMPARISON:  03/17/2017 FINDINGS: Right pleural effusion is markedly improved. No pneumothorax. Residual right basilar atelectasis. Left lung clear. Cardiac silhouette partially obscured. IMPRESSION: No pneumothorax post right thoracentesis. Electronically Signed   By: Jolaine Click M.D.   On: 03/19/2017 11:13   US Abdomen Limited  Result Date: 03/19/2017 CLINICAL DATA:   Evaluate for ascites EXAM: LIMITED ABDOMEN ULTRASOUND FOR ASCITES TECHNIQUE: Limited ultrasound survey for ascites was performed in all four abdominal quadrants. COMPARISON:  None. FINDINGS: There is no significant ascites in the abdomen. Paracentesis was not performed. IMPRESSION: No significant ascites in the abdomen. Electronically Signed   By: Jolaine Click M.D.   On: 03/19/2017 11:14   US Thoracentesis Asp Pleural Space W/img Guide  Result Date: 03/19/2017 INDICATION: Right pleural effusion EXAM: ULTRASOUND GUIDED RIGHT THORACENTESIS MEDICATIONS: None. COMPLICATIONS: None immediate. PROCEDURE: An ultrasound guided thoracentesis was thoroughly discussed with the patient and questions answered. The benefits, risks, alternatives and complications were also discussed. The patient understands and wishes to proceed with the procedure. Written  consent was obtained. Ultrasound was performed to localize and mark an adequate pocket of fluid in the right chest. The area was then prepped and draped in the normal sterile fashion. 1% Lidocaine was used for local anesthesia. Under ultrasound guidance a 6 Fr Safe-T-Centesis catheter was introduced. Thoracentesis was performed. The catheter was removed and a dressing applied. FINDINGS: A total of approximately 2.6 L of clear yellow fluid was removed. Samples were sent to the laboratory as requested by the clinical team. IMPRESSION: Successful ultrasound guided right thoracentesis yielding 2.6 L of pleural fluid. Electronically Signed   By: Jolaine ClickArthur  Hoss M.D.   On: 03/19/2017 11:13      Scheduled Meds: . escitalopram  10 mg Oral Daily  . ferrous sulfate  325 mg Oral BID WC  . furosemide  40 mg Intravenous BID  . lidocaine (PF)      . mouth rinse  15 mL Mouth Rinse BID  . Melatonin  9 mg Oral QHS  . pantoprazole  20 mg Oral QAC breakfast  . phytonadione  10 mg Oral Daily  . potassium chloride SA  20 mEq Oral Daily  . prednisoLONE  40 mg Oral Daily  .  spironolactone  200 mg Oral Daily  . thiamine  100 mg Oral Daily   Continuous Infusions:   LOS: 1 day    Time spent in minutes: 35    Calvert CantorSaima Ethyle Tiedt, MD [facility] Pager: [email or web address] Password [email or web address] 03/19/2017, 5:41 PM

## 2017-03-20 DIAGNOSIS — K7460 Unspecified cirrhosis of liver: Secondary | ICD-10-CM

## 2017-03-20 DIAGNOSIS — R17 Unspecified jaundice: Secondary | ICD-10-CM

## 2017-03-20 LAB — GRAM STAIN

## 2017-03-20 MED ORDER — ADULT MULTIVITAMIN W/MINERALS CH
1.0000 | ORAL_TABLET | Freq: Every day | ORAL | Status: DC
  Administered 2017-03-20 – 2017-03-21 (×2): 1 via ORAL
  Filled 2017-03-20 (×2): qty 1

## 2017-03-20 MED ORDER — ENSURE ENLIVE PO LIQD
237.0000 mL | Freq: Two times a day (BID) | ORAL | Status: DC
  Administered 2017-03-20 – 2017-03-21 (×2): 237 mL via ORAL

## 2017-03-20 NOTE — Progress Notes (Signed)
Daily Rounding Note  03/20/2017, 12:03 PM  LOS: 2 days   SUBJECTIVE:   Chief complaint:  Her dyspnea on exertion/shortness of breath has resolved. She was able to sleep laying flat on one pillow last night for the first time in many days. She feels great and would like to discharge home today. Had one or 2 brown stools yesterday   OBJECTIVE:         Vital signs in last 24 hours:    Temp:  [97.8 F (36.6 C)-98.2 F (36.8 C)] 97.8 F (36.6 C) (08/13 0430) Pulse Rate:  [88-98] 88 (08/13 0430) Resp:  [16] 16 (08/13 0430) BP: (92-101)/(45-61) 92/52 (08/13 0430) SpO2:  [97 %-100 %] 98 % (08/13 0430) Weight:  [55.2 kg (121 lb 11.2 oz)] 55.2 kg (121 lb 11.2 oz) (08/13 0432) Last BM Date: 03/20/17 Filed Weights   03/17/17 1526 03/19/17 0500 03/20/17 0432  Weight: 55.8 kg (123 lb) 58.6 kg (129 lb 3.2 oz) 55.2 kg (121 lb 11.2 oz)   General: Jaundice, alert. Comfortable. Looks better than expected.   Heart: RRR with harsh 2-3/6 systolic murmur Chest: Clear bilaterally. No dyspnea or cough. Abdomen: Soft. Nontender. No HSM appreciated. Active bowel sounds. Not obviously protuberant or distended  Extremities: No CCE. Neuro/Psych:  Oriented times 3. No tremor. No asterixis.  Intake/Output from previous day: 08/12 0701 - 08/13 0700 In: 120 [P.O.:120] Out: 1200 [Urine:1200]  Intake/Output this shift: No intake/output data recorded.  Lab Results:  Recent Labs  03/17/17 1530 03/18/17 0437 03/19/17 0400  WBC 10.2 10.4 9.4  HGB 8.7* 8.5* 8.3*  HCT 26.2* 25.2* 24.1*  PLT 123* 123* 117*   BMET  Recent Labs  03/18/17 1241 03/19/17 0400 03/19/17 1817  NA 127* 127* 129*  K 5.4* 4.9 4.2  CL 101 99* 98*  CO2 18* 18* 20*  GLUCOSE 85 141* 162*  BUN 16 20 25*  CREATININE 1.12* 1.30* 1.37*  CALCIUM 9.0 8.7* 8.7*   LFT  Recent Labs  03/17/17 2107  03/18/17 1241 03/19/17 0400 03/19/17 1817  PROT 9.1*  < > 8.5*  8.5* 8.6*  ALBUMIN 2.0*  < > 2.0* 1.8* 1.7*  AST 228*  < > 207* 160* 135*  ALT 194*  < > 172* 158* 147*  ALKPHOS 116  < > 99 111 106  BILITOT 30.0*  < > 29.6* 28.4* 28.4*  BILIDIR 17.5*  --   --   --   --   IBILI 12.5*  --   --   --   --   < > = values in this interval not displayed. PT/INR  Recent Labs  03/18/17 0437 03/19/17 0400  LABPROT 23.8* 23.3*  INR 2.09 2.03   Hepatitis Panel No results for input(s): HEPBSAG, HCVAB, HEPAIGM, HEPBIGM in the last 72 hours.  Studies/Results: Dg Chest 1 View  Result Date: 03/19/2017 CLINICAL DATA:  Thoracentesis EXAM: CHEST 1 VIEW COMPARISON:  03/17/2017 FINDINGS: Right pleural effusion is markedly improved. No pneumothorax. Residual right basilar atelectasis. Left lung clear. Cardiac silhouette partially obscured. IMPRESSION: No pneumothorax post right thoracentesis. Electronically Signed   By: Jolaine ClickArthur  Hoss M.D.   On: 03/19/2017 11:13   [place] Abdomen Limited  Result Date: 03/19/2017 CLINICAL DATA:  Evaluate for ascites EXAM: LIMITED ABDOMEN ULTRASOUND FOR ASCITES TECHNIQUE: Limited ultrasound survey for ascites was performed in all four abdominal quadrants. COMPARISON:  None. FINDINGS: There is no significant ascites in the abdomen. Paracentesis was not performed.  IMPRESSION: No significant ascites in the abdomen. Electronically Signed   By: Jolaine Click M.D.   On: 03/19/2017 11:14   US Thoracentesis Asp Pleural Space W/img Guide  Result Date: 03/19/2017 INDICATION: Right pleural effusion EXAM: ULTRASOUND GUIDED RIGHT THORACENTESIS MEDICATIONS: None. COMPLICATIONS: None immediate. PROCEDURE: An ultrasound guided thoracentesis was thoroughly discussed with the patient and questions answered. The benefits, risks, alternatives and complications were also discussed. The patient understands and wishes to proceed with the procedure. Written consent was obtained. Ultrasound was performed to localize and mark an adequate pocket of fluid in the right chest.  The area was then prepped and draped in the normal sterile fashion. 1% Lidocaine was used for local anesthesia. Under ultrasound guidance a 6 Fr Safe-T-Centesis catheter was introduced. Thoracentesis was performed. The catheter was removed and a dressing applied. FINDINGS: A total of approximately 2.6 L of clear yellow fluid was removed. Samples were sent to the laboratory as requested by the clinical team. IMPRESSION: Successful ultrasound guided right thoracentesis yielding 2.6 L of pleural fluid. Electronically Signed   By: Jolaine Click M.D.   On: 03/19/2017 11:13   Scheduled Meds: . escitalopram  10 mg Oral Daily  . ferrous sulfate  325 mg Oral BID WC  . furosemide  40 mg Oral BID  . mouth rinse  15 mL Mouth Rinse BID  . Melatonin  9 mg Oral QHS  . pantoprazole  20 mg Oral QAC breakfast  . phytonadione  10 mg Oral Daily  . potassium chloride SA  20 mEq Oral Daily  . prednisoLONE  40 mg Oral Daily  . spironolactone  200 mg Oral Daily  . thiamine  100 mg Oral Daily   Continuous Infusions: PRN Meds:.ipratropium-albuterol   ASSESMENT:   *  ETOH cirrhosis, decompensated.  ETOH hepatitis.  Weakly positive a NA and weakly positive smooth muscle antibody of unclear significance. MELD 31.  Disc Factor 73.  Day 3/30 Prednisolone Alkaline phosphatase, transaminases improved. Remained significantly jaundiced with total bilirubin 20 8.4  *  Right pleural effusion.  Status post 03/19/17  2.6 L thoracentesis, transudative. Hepatic hydrothorax requiring 2 L thoracentesis in mid 02/2017. CXR following thoracentesis shows marketed improvement in right pleural effusion  *  Ascites, history of. Ultrasound 03/19/17 shows no significant ascites. On 01/20/17 ultrasound small ascites noted.  *  Coagulopathy.  Stable.    *  Normocytic anemia.    02/01/17 EGD.  LA grade a esophagitis, otherwise normal. 02/01/17 colonoscopy for diarrhea. Scattered, mild diverticulosis. Random scattered biopsies revealed no  pathology. Dr. Leone Payor suspects diarrhea secondary to hypoproteinemia.    *  Protein malnutrition. Hypoalbuminemia   *  Hyponatremia.  Stable.  *  AKI.  Rising BUN/creatinine.  Current diuretics are Aldlactone 200 mg, Lasix 80 mg daily.  *  Splenomegaly, upper abdominal varicosities, mild ascites noted on MRI liver 02/22/17.   PLAN   *  Back off on the diuretics? With her worsening renal function I don't think that she should go home despite her wishes. Repeat Bmet in the morning   Jennye Moccasin  03/20/2017, 12:03 PM Pager: [phone]    Attending physician's note   I have taken an interval history, reviewed the chart and examined the patient. I agree with the Advanced Practitioner's note, impression and recommendations. AKI likely related to diuretics. Hold discharge and hold diuretics until repeat BMET in am.   Claudette Head, MD [facility] [phone] Mon-Fri 8a-5p [phone] after 5p, weekends, holidays

## 2017-03-20 NOTE — Progress Notes (Signed)
Nutrition Follow-up  DOCUMENTATION CODES:   Severe malnutrition in context of chronic illness, Underweight  INTERVENTION:   -Ensure Enlive po BID, each supplement provides 350 kcal and 20 grams of protein -MVI daily  NUTRITION DIAGNOSIS:   Malnutrition (severe) related to chronic illness (alcoholic liver disease) as evidenced by severe depletion of body fat, severe depletion of muscle mass, percent weight loss.  Ongoing  GOAL:   Patient will meet greater than or equal to 90% of their needs  Progressing  MONITOR:   PO intake, Supplement acceptance, Labs, Weight trends, I & O's  REASON FOR ASSESSMENT:   Consult Assessment of nutrition requirement/status  ASSESSMENT:   51 y.o. female with medical history significant of ETOH cirrhosis, hepatic hydrothorax, anxiety who presents for SOB.   8/12- s/p rt thoracentesis- 2.6 L fluid removed   Spoke with pt, who was in good spirits at time of visit. She reports she is feeling much better. Pt shares that her appetite is fair to poor at baseline, however, pt tries to adhere to sodium restriction and eat a wide variety of foods. She estimates she consumed 50% of breakfast this morning.   She endorses consuming Ensure supplements (once daily) PTA. She is amenable to continue supplements during hospitalization and at home. Discussed importance of good meal and supplement intake to promote healing.   Medications reviewed and include vitamin K, KCl, vitamin B-1, and prednisolone.   Labs reviewed: Na: 129.  Diet Order:  Diet 2 gram sodium Room service appropriate? Yes; Fluid consistency: Thin  Skin:  Reviewed, no issues  Last BM:  03/20/17  Height:   Ht Readings from Last 1 Encounters:  03/17/17 5' 8.5" (1.74 m)    Weight:   Wt Readings from Last 1 Encounters:  03/20/17 121 lb 11.2 oz (55.2 kg)    Ideal Body Weight:  63.6 kg  BMI:  Body mass index is 18.24 kg/m.  Estimated Nutritional Needs:   Kcal:   1700-1900  Protein:  95-105 grams  Fluid:  1.7-1.9 L  EDUCATION NEEDS:   No education needs identified at this time  Reyce Lubeck A. Mayford KnifeWilliams, RD, LDN, CDE Pager: [phone] After hours Pager: [phone]

## 2017-03-20 NOTE — Progress Notes (Signed)
Patient Saturations on Room Air at Rest = 99%  Patient Saturations on [employer] Air while Ambulating = 95%  Ambulated by Maisie Fushomas, NT without complications. Denies shortness of breath.

## 2017-03-20 NOTE — Progress Notes (Signed)
PROGRESS NOTE    Rhonda Haley   ZOX:[id number]  DOB: 1965/09/14  DOA: 03/17/2017 PCP: Lewis Moccasin, MD   Brief Narrative:  Rhonda Haley is a 51 y.o. female with medical history significant of ETOH cirrhosis, hepatic hydrothorax, anxiety who presents for SOB and enlarging abdomen.   She has been out of spironolactone for 1 wk now although her GI office appears to have received the message and made a note in the computer system to approver her for refills.   CXR shows large R pleural effusion. She underwent a thoracentesis on 02/21/17 and it was suspected that this was hepatic hydrothorax.     Subjective:  No complaints today and wants to go home.  ROS: no complaints of nausea, vomiting, constipation diarrhea, cough, dyspnea or dysuria. No other complaints.   Assessment & Plan:   Principal Problem:   Hydrothorax- hepatic - Spironolactone resumed- cont Lasix - s/p thoracentesis today- 2.6 L, transudative- wean O2 as able- she states pOx still drops to 80s when she walks to the bathroom- need nasal cannula extension for when she ambulates - started IS as well - have asked RN for ambulatory pulse ox today  Active Problems:    Alcoholic cirrhosis of liver with ascites and elevated LFTs including severe hyperbilirubinuria  -  GI following - prednisolone started due to severity of hepatitis - paracentesis attempted but she has no ascites at this time  AKI - likely due to diuretics- GI to manage diuretics    Normocytic anemia, not due to blood loss - on Iron supplements    DVT prophylaxis: SCDs Code Status: Full code Family Communication:  Disposition Plan: home when stable Consultants:   GI Procedures:   none Antimicrobials:  Anti-infectives    None       Objective: Vitals:   03/19/17 1614 03/19/17 2123 03/20/17 0430 03/20/17 0432  BP: 101/61 (!) 93/45 (!) 92/52   Pulse: 98 97 88   Resp: 16 16 16    Temp: 98.1 F (36.7 C) 98.2 F (36.8 C) 97.8 F (36.6 C)    TempSrc: Oral Oral Oral   SpO2: 97% 100% 98%   Weight:    55.2 kg (121 lb 11.2 oz)  Height:        Intake/Output Summary (Last 24 hours) at 03/20/17 1304 Last data filed at 03/19/17 2215  Gross per 24 hour  Intake              120 ml  Output             1200 ml  Net            -1080 ml   Filed Weights   03/17/17 1526 03/19/17 0500 03/20/17 0432  Weight: 55.8 kg (123 lb) 58.6 kg (129 lb 3.2 oz) 55.2 kg (121 lb 11.2 oz)    Examination: General exam: Appears comfortable  HEENT: PERRLA, oral mucosa moist, +++sclera icterus   Respiratory system: Clear to auscultation. Respiratory effort normal. Cardiovascular system: S1 & S2 heard,  No murmurs  Gastrointestinal system: Abdomen soft, non-tender, nondistended. Normal bowel sound. No organomegaly Central nervous system: Alert and oriented. No focal neurological deficits. Extremities: No cyanosis, clubbing or edema Skin: No rashes or ulcers Psychiatry:  Mood & affect appropriate.     Data Reviewed: I have personally reviewed following labs and imaging studies  CBC:  Recent Labs Lab 03/14/17 1042 03/17/17 1530 03/18/17 0437 03/19/17 0400  WBC 13.1* 10.2 10.4 9.4  NEUTROABS 10.0*  --   --   --  HGB 9.7* 8.7* 8.5* 8.3*  HCT 28.8* 26.2* 25.2* 24.1*  MCV 97.9 95.6 93.7 94.1  PLT 162.0 123* 123* 117*   Basic Metabolic Panel:  Recent Labs Lab 03/17/17 1530 03/18/17 0437 03/18/17 1241 03/19/17 0400 03/19/17 1817  NA 129* 128* 127* 127* 129*  K 4.8 5.2* 5.4* 4.9 4.2  CL 103 101 101 99* 98*  CO2 18* 20* 18* 18* 20*  GLUCOSE 119* 109* 85 141* 162*  BUN 13 14 16 20  25*  CREATININE 0.82 0.96 1.12* 1.30* 1.37*  CALCIUM 8.9 9.2 9.0 8.7* 8.7*   GFR: Estimated Creatinine Clearance: 41.4 mL/min (A) (by C-G formula based on SCr of 1.37 mg/dL (H)). Liver Function Tests:  Recent Labs Lab 03/17/17 2107 03/18/17 0437 03/18/17 1241 03/19/17 0400 03/19/17 1817  AST 228* 217* 207* 160* 135*  ALT 194* 184* 172* 158* 147*    ALKPHOS 116 108 99 111 106  BILITOT 30.0* 28.2* 29.6* 28.4* 28.4*  PROT 9.1* 8.7* 8.5* 8.5* 8.6*  ALBUMIN 2.0* 1.9* 2.0* 1.8* 1.7*   No results for input(s): LIPASE, AMYLASE in the last 168 hours.  Recent Labs Lab 03/18/17 1325  AMMONIA 22   Coagulation Profile:  Recent Labs Lab 03/14/17 1042 03/17/17 2107 03/18/17 0437 03/19/17 0400  INR 2.0* 2.18 2.09 2.03   Cardiac Enzymes: No results for input(s): CKTOTAL, CKMB, CKMBINDEX, TROPONINI in the last 168 hours. BNP (last 3 results) No results for input(s): PROBNP in the last 8760 hours. HbA1C: No results for input(s): HGBA1C in the last 72 hours. CBG: No results for input(s): GLUCAP in the last 168 hours. Lipid Profile: No results for input(s): CHOL, HDL, LDLCALC, TRIG, CHOLHDL, LDLDIRECT in the last 72 hours. Thyroid Function Tests: No results for input(s): TSH, T4TOTAL, FREET4, T3FREE, THYROIDAB in the last 72 hours. Anemia Panel: No results for input(s): VITAMINB12, FOLATE, FERRITIN, TIBC, IRON, RETICCTPCT in the last 72 hours. Urine analysis: No results found for: COLORURINE, APPEARANCEUR, LABSPEC, PHURINE, GLUCOSEU, HGBUR, BILIRUBINUR, KETONESUR, PROTEINUR, UROBILINOGEN, NITRITE, LEUKOCYTESUR Sepsis Labs: @LABRCNTIP (procalcitonin:4,lacticidven:4) ) Recent Results (from the past 240 hour(s))  Gram stain     Status: None   Collection Time: 03/19/17 11:02 AM  Result Value Ref Range Status   Specimen Description PLEURAL RIGHT  Final   Special Requests NONE  Final   Gram Stain   Final    RARE WBC PRESENT, PREDOMINANTLY MONONUCLEAR NO ORGANISMS SEEN    Report Status 03/20/2017 FINAL  Final         Radiology Studies: Dg Chest 1 View  Result Date: 03/19/2017 CLINICAL DATA:  Thoracentesis EXAM: CHEST 1 VIEW COMPARISON:  03/17/2017 FINDINGS: Right pleural effusion is markedly improved. No pneumothorax. Residual right basilar atelectasis. Left lung clear. Cardiac silhouette partially obscured. IMPRESSION: No  pneumothorax post right thoracentesis. Electronically Signed   By: Jolaine Click M.D.   On: 03/19/2017 11:13   US Abdomen Limited  Result Date: 03/19/2017 CLINICAL DATA:  Evaluate for ascites EXAM: LIMITED ABDOMEN ULTRASOUND FOR ASCITES TECHNIQUE: Limited ultrasound survey for ascites was performed in all four abdominal quadrants. COMPARISON:  None. FINDINGS: There is no significant ascites in the abdomen. Paracentesis was not performed. IMPRESSION: No significant ascites in the abdomen. Electronically Signed   By: Jolaine Click M.D.   On: 03/19/2017 11:14   US Thoracentesis Asp Pleural Space W/img Guide  Result Date: 03/19/2017 INDICATION: Right pleural effusion EXAM: ULTRASOUND GUIDED RIGHT THORACENTESIS MEDICATIONS: None. COMPLICATIONS: None immediate. PROCEDURE: An ultrasound guided thoracentesis was thoroughly discussed with the patient and questions  answered. The benefits, risks, alternatives and complications were also discussed. The patient understands and wishes to proceed with the procedure. Written consent was obtained. Ultrasound was performed to localize and mark an adequate pocket of fluid in the right chest. The area was then prepped and draped in the normal sterile fashion. 1% Lidocaine was used for local anesthesia. Under ultrasound guidance a 6 Fr Safe-T-Centesis catheter was introduced. Thoracentesis was performed. The catheter was removed and a dressing applied. FINDINGS: A total of approximately 2.6 L of clear yellow fluid was removed. Samples were sent to the laboratory as requested by the clinical team. IMPRESSION: Successful ultrasound guided right thoracentesis yielding 2.6 L of pleural fluid. Electronically Signed   By: Jolaine ClickArthur  Hoss M.D.   On: 03/19/2017 11:13      Scheduled Meds: . escitalopram  10 mg Oral Daily  . feeding supplement (ENSURE ENLIVE)  237 mL Oral BID BM  . ferrous sulfate  325 mg Oral BID WC  . furosemide  40 mg Oral BID  . mouth rinse  15 mL Mouth Rinse BID   . Melatonin  9 mg Oral QHS  . pantoprazole  20 mg Oral QAC breakfast  . phytonadione  10 mg Oral Daily  . potassium chloride SA  20 mEq Oral Daily  . prednisoLONE  40 mg Oral Daily  . spironolactone  200 mg Oral Daily  . thiamine  100 mg Oral Daily   Continuous Infusions:   LOS: 2 days    Time spent in minutes: 35    Calvert CantorSaima Molina Hollenback, MD [facility] Pager: [email or web address] Password [email or web address] 03/20/2017, 1:04 PM

## 2017-03-21 DIAGNOSIS — E8809 Other disorders of plasma-protein metabolism, not elsewhere classified: Secondary | ICD-10-CM

## 2017-03-21 DIAGNOSIS — E46 Unspecified protein-calorie malnutrition: Secondary | ICD-10-CM

## 2017-03-21 DIAGNOSIS — D696 Thrombocytopenia, unspecified: Secondary | ICD-10-CM

## 2017-03-21 DIAGNOSIS — E43 Unspecified severe protein-calorie malnutrition: Secondary | ICD-10-CM

## 2017-03-21 DIAGNOSIS — K7011 Alcoholic hepatitis with ascites: Secondary | ICD-10-CM

## 2017-03-21 DIAGNOSIS — E871 Hypo-osmolality and hyponatremia: Secondary | ICD-10-CM

## 2017-03-21 DIAGNOSIS — D689 Coagulation defect, unspecified: Secondary | ICD-10-CM

## 2017-03-21 DIAGNOSIS — D739 Disease of spleen, unspecified: Secondary | ICD-10-CM

## 2017-03-21 LAB — BASIC METABOLIC PANEL
ANION GAP: 9 (ref 5–15)
BUN: 27 mg/dL — ABNORMAL HIGH (ref 6–20)
CO2: 21 mmol/L — AB (ref 22–32)
Calcium: 8.4 mg/dL — ABNORMAL LOW (ref 8.9–10.3)
Chloride: 99 mmol/L — ABNORMAL LOW (ref 101–111)
Creatinine, Ser: 1.2 mg/dL — ABNORMAL HIGH (ref 0.44–1.00)
GFR calc Af Amer: 59 mL/min — ABNORMAL LOW (ref >60)
GFR, EST NON AFRICAN AMERICAN: 51 mL/min — AB (ref >60)
GLUCOSE: 138 mg/dL — AB (ref 65–99)
POTASSIUM: 4.1 mmol/L (ref 3.5–5.1)
Sodium: 129 mmol/L — ABNORMAL LOW (ref 135–145)

## 2017-03-21 LAB — PATHOLOGIST SMEAR REVIEW

## 2017-03-21 MED ORDER — SPIRONOLACTONE 25 MG PO TABS: 100.0000 mg | ORAL_TABLET | Freq: Every day | ORAL | Status: DC

## 2017-03-21 MED ORDER — FUROSEMIDE 40 MG PO TABS: 40.0000 mg | ORAL_TABLET | Freq: Every day | ORAL | 0 refills | Status: DC

## 2017-03-21 MED ORDER — PREDNISOLONE 5 MG PO TABS: 40.0000 mg | ORAL_TABLET | Freq: Every day | ORAL | 0 refills | Status: DC

## 2017-03-21 MED ORDER — SPIRONOLACTONE 25 MG PO TABS
100.0000 mg | ORAL_TABLET | Freq: Every day | ORAL | Status: DC
  Filled 2017-03-21: qty 4

## 2017-03-21 MED ORDER — FUROSEMIDE 40 MG PO TABS
40.0000 mg | ORAL_TABLET | Freq: Every day | ORAL | Status: DC
  Filled 2017-03-21: qty 1

## 2017-03-21 MED ORDER — FUROSEMIDE 40 MG PO TABS: 40.0000 mg | ORAL_TABLET | Freq: Every day | ORAL | Status: DC

## 2017-03-21 MED ORDER — ADULT MULTIVITAMIN W/MINERALS CH: 1.0000 | ORAL_TABLET | Freq: Every day | ORAL | Status: AC

## 2017-03-21 MED ORDER — POTASSIUM CHLORIDE CRYS ER 20 MEQ PO TBCR: 20.0000 meq | EXTENDED_RELEASE_TABLET | Freq: Every day | ORAL | Status: DC

## 2017-03-21 NOTE — Progress Notes (Addendum)
Daily Rounding Note  03/21/2017, 9:42 AM  LOS: 3 days   SUBJECTIVE:   Chief complaint: wants to go home.    Denies SOB with walking.  No abd pain.  Tolerating low Na diet.    OBJECTIVE:         Vital signs in last 24 hours:    Temp:  [97.7 F (36.5 C)-98.1 F (36.7 C)] 98.1 F (36.7 C) (08/14 0642) Pulse Rate:  [86-93] 86 (08/14 0642) Resp:  [14-20] 20 (08/14 0642) BP: (94-104)/(44-52) 104/52 (08/14 0642) SpO2:  [93 %-99 %] 93 % (08/14 0642) Weight:  [55.6 kg (122 lb 8 oz)] 55.6 kg (122 lb 8 oz) (08/14 0500) Last BM Date: 03/20/17 Filed Weights   03/19/17 0500 03/20/17 0432 03/21/17 0500  Weight: 58.6 kg (129 lb 3.2 oz) 55.2 kg (121 lb 11.2 oz) 55.6 kg (122 lb 8 oz)   General: jaundiced and thin, but otherwise looks well.     Heart: RRR Chest: clear bil.  Reduced BS on right.  No SOB or cough Abdomen: soft, NT, ND.  Active BS  Extremities: no CCE Neuro/Psych:  Oriented x 3.  No asterixis, tremor or limb weakness.   Intake/Output from previous day: 08/13 0701 - 08/14 0700 In: 360 [P.O.:360] Out: 300 [Urine:300]  Intake/Output this shift: No intake/output data recorded.  Lab Results:  Recent Labs  03/19/17 0400  WBC 9.4  HGB 8.3*  HCT 24.1*  PLT 117*   BMET  Recent Labs  03/19/17 0400 03/19/17 1817 03/21/17 0317  NA 127* 129* 129*  K 4.9 4.2 4.1  CL 99* 98* 99*  CO2 18* 20* 21*  GLUCOSE 141* 162* 138*  BUN 20 25* 27*  CREATININE 1.30* 1.37* 1.20*  CALCIUM 8.7* 8.7* 8.4*   LFT  Recent Labs  03/18/17 1241 03/19/17 0400 03/19/17 1817  PROT 8.5* 8.5* 8.6*  ALBUMIN 2.0* 1.8* 1.7*  AST 207* 160* 135*  ALT 172* 158* 147*  ALKPHOS 99 111 106  BILITOT 29.6* 28.4* 28.4*   PT/INR  Recent Labs  03/19/17 0400  LABPROT 23.3*  INR 2.03   Hepatitis Panel No results for input(s): HEPBSAG, HCVAB, HEPAIGM, HEPBIGM in the last 72 hours.  Studies/Results: Dg Chest 1 View  Result Date:  03/19/2017 CLINICAL DATA:  Thoracentesis EXAM: CHEST 1 VIEW COMPARISON:  03/17/2017 FINDINGS: Right pleural effusion is markedly improved. No pneumothorax. Residual right basilar atelectasis. Left lung clear. Cardiac silhouette partially obscured. IMPRESSION: No pneumothorax post right thoracentesis. Electronically Signed   By: Jolaine Click M.D.   On: 03/19/2017 11:13   US Abdomen Limited  Result Date: 03/19/2017 CLINICAL DATA:  Evaluate for ascites EXAM: LIMITED ABDOMEN ULTRASOUND FOR ASCITES TECHNIQUE: Limited ultrasound survey for ascites was performed in all four abdominal quadrants. COMPARISON:  None. FINDINGS: There is no significant ascites in the abdomen. Paracentesis was not performed. IMPRESSION: No significant ascites in the abdomen. Electronically Signed   By: Jolaine Click M.D.   On: 03/19/2017 11:14   US Thoracentesis Asp Pleural Space W/img Guide  Result Date: 03/19/2017 INDICATION: Right pleural effusion EXAM: ULTRASOUND GUIDED RIGHT THORACENTESIS MEDICATIONS: None. COMPLICATIONS: None immediate. PROCEDURE: An ultrasound guided thoracentesis was thoroughly discussed with the patient and questions answered. The benefits, risks, alternatives and complications were also discussed. The patient understands and wishes to proceed with the procedure. Written consent was obtained. Ultrasound was performed to localize and mark an adequate pocket of fluid in the right chest. The area  was then prepped and draped in the normal sterile fashion. 1% Lidocaine was used for local anesthesia. Under ultrasound guidance a 6 Fr Safe-T-Centesis catheter was introduced. Thoracentesis was performed. The catheter was removed and a dressing applied. FINDINGS: A total of approximately 2.6 L of clear yellow fluid was removed. Samples were sent to the laboratory as requested by the clinical team. IMPRESSION: Successful ultrasound guided right thoracentesis yielding 2.6 L of pleural fluid. Electronically Signed   By: Jolaine ClickArthur   Hoss M.D.   On: 03/19/2017 11:13   Scheduled Meds: . escitalopram  10 mg Oral Daily  . feeding supplement (ENSURE ENLIVE)  237 mL Oral BID BM  . ferrous sulfate  325 mg Oral BID WC  . [START ON 03/22/2017] furosemide  40 mg Oral Daily  . mouth rinse  15 mL Mouth Rinse BID  . Melatonin  9 mg Oral QHS  . multivitamin with minerals  1 tablet Oral Daily  . pantoprazole  20 mg Oral QAC breakfast  . phytonadione  10 mg Oral Daily  . [START ON 03/22/2017] potassium chloride SA  20 mEq Oral Daily  . prednisoLONE  40 mg Oral Daily  . [START ON 03/22/2017] spironolactone  100 mg Oral Daily  . thiamine  100 mg Oral Daily   Continuous Infusions: PRN Meds:.ipratropium-albuterol  ASSESMENT:   *  ETOH cirrhosis, decompensated.  ETOH hepatitis.  Weakly positive a NA and weakly positive smooth muscle antibody of unclear significance. MELD 31.  Disc Factor 73.  Day 4/30 Prednisolone Alkaline phosphatase, transaminases improved. Remained significantly jaundiced with total bilirubin 20 8.4  *  Right pleural effusion.  Status post 03/19/17  2.6 L thoracentesis, transudative. Hepatic hydrothorax requiring 2 L thoracentesis in mid 02/2017. CXR following thoracentesis shows marketed improvement in right pleural effusion.  Diminished right breath sounds on exam.    *  Ascites, history of. Ultrasound 03/19/17 shows no significant ascites. On 01/20/17 ultrasound small ascites noted.  *  Coagulopathy.  Stable.  on po Vitamin K.    *  Normocytic anemia.  On BID PO iron.   02/01/17 EGD.  LA grade a esophagitis, otherwise normal. 02/01/17 colonoscopy for diarrhea. Scattered, mild diverticulosis. Random scattered biopsies revealed no pathology. Dr. Leone PayorGessner suspects diarrhea secondary to hypoproteinemia.    *  Protein malnutrition. Hypoalbuminemia.    *  Hyponatremia.  Stable.  *  AKI. Aldlactone 200 mg, Lasix 80 mg daily were held last PM and renal function improved.   *  Splenomegaly, upper abdominal  varicosities, mild ascites noted on MRI liver 02/22/17.    PLAN   *  Restart aldactone, lasix at 1/2 previous dose tomorrow per d/w Dr Russella DarStark.     BMET in AM.    Given recent elevated potassium, and holding of lasix, will hold today's potassium.     Jennye MoccasinSarah Gribbin  03/21/2017, 9:42 AM Pager: [phone]     Attending physician's note   I have taken an interval history, reviewed the chart and examined the patient. I agree with the Advanced Practitioner's note, impression and recommendations. Resume Aldactone at 100 mg qd and Lasix at 40 mg daily tomorrow. Outpatient follow up with Dr. Leone PayorGessner on 8/29. OK for discharge from GI standpoint. GI signing off.     Claudette HeadMalcolm Evolet Salminen, MD Clementeen GrahamFACG [phone] Mon-Fri 8a-5p [phone] after 5p, weekends, holidays

## 2017-03-21 NOTE — Discharge Instructions (Signed)
Take an extra 40 mg of Lasix if your weight goes up by 3 or more pounds in 24 hrs. Keep a record of your weight daily. You have a high risk to bleed excessively due to your liver failure- take extra caution to prevent injury.   Please take all your medications with you for your next visit with your Primary MD. Please request your Primary MD to go over all hospital test results at the follow up. Please ask your Primary MD to get all Hospital records sent to his/her office.  If you experience worsening of your admission symptoms, develop shortness of breath, chest pain, suicidal or homicidal thoughts or a life threatening emergency, you must seek medical attention immediately by calling 911 or calling your MD.  Rhonda QuinYou must read the complete instructions/literature along with all the possible adverse reactions/side effects for all the medicines you take including new medications that have been prescribed to you. Take new medicines after you have completely understood and accpet all the possible adverse reactions/side effects.   Do not drive when taking pain medications or sedatives.    Do not take more than prescribed Pain, Sleep and Anxiety Medications  If you have smoked or chewed Tobacco in the last 2 yrs please stop. Stop any regular alcohol and or recreational drug use.  Wear Seat belts while driving.

## 2017-03-21 NOTE — Discharge Summary (Addendum)
Physician Discharge Summary  Rhonda Haley ZOX:[id number] DOB: Jul 10, 1966 DOA: 03/17/2017  PCP: Lewis Moccasin, MD  Admit date: 03/17/2017 Discharge date: 03/21/2017  Admitted From: home  Disposition:  home   Recommendations for Outpatient Follow-up:  1. Dry weight 122 lb 2. High risk of bleeding 3. bmet in 1wk  Discharge Condition:  stable   CODE STATUS:  Full code   Consultations:  GI    Discharge Diagnoses:  Principal Problem:   Hydrothorax- hepatic Active Problems:   Alcoholic hepatitis with ascites Severe hyperbilirubinemia with jaundice   Normocytic anemia, not due to blood loss   Alcoholic cirrhosis of liver with ascites (HCC)   Protein-calorie malnutrition, severe   Coagulopathy (HCC)   Hyponatremia   Thrombocytopenia (HCC)    Subjective: No complaints today. Ready to go home. Breathing is significantly improved.   Brief Summary: Jennfier Haley a 51 y.o.femalewith medical history significant of ETOH cirrhosis, hepatic hydrothorax, anxiety who presents for SOB and enlarging abdomen.  She has been out of spironolactone for 1 wk now although her GI office appears to have received the message and made a note in the computer system to approver her for refills.   CXR shows large R pleural effusion. She underwent a thoracentesis on 02/21/17 and it was suspected that this was hepatic hydrothorax.    Hospital Course:  Principal Problem:   R hepatic Hydrothorax - acute resp failure - due to lacking Spironolactone which was resumed  - 8/13 thoracentesis  - 2.6 L, transudative- weaned to room air now - started IS as well    Active Problems:    Alcoholic cirrhosis of liver with ascites and elevated LFTs including severe hyperbilirubinuria  -  GI following  - prednisolone started due to severity of hepatitis- follow LFTs as outpt - paracentesis attempted but she has no ascites at this time  AKI with treatment of fluid overload - likely due to diuretics-  Lasix and Aldactone held yesterday - can hold again today and resume tomorrow- Lasix will be at a lower dose of 40 mg daily insead of BID - if her weight increases, she can take additional Lasix- instructions fiven    Normocytic anemia, not due to blood loss - on Iron supplements  Coagulopathy/ Thrombocytopenia - INR > 2 - Plt 110-150 - bleeding precautions     Discharge Instructions  Discharge Instructions    Diet - low sodium heart healthy    Complete by:  As directed    Increase activity slowly    Complete by:  As directed      Allergies as of 03/21/2017   Not on File     Medication List    STOP taking these medications   loperamide 2 MG tablet Commonly known as:  IMODIUM A-D     TAKE these medications   escitalopram 20 MG tablet Commonly known as:  LEXAPRO Take 20 mg by mouth daily.   feeding supplement (ENSURE ENLIVE) Liqd Take 237 mLs by mouth 2 (two) times daily between meals.   ferrous sulfate 325 (65 FE) MG tablet Take 1 tablet (325 mg total) by mouth 2 (two) times daily with a meal.   furosemide 40 MG tablet Commonly known as:  LASIX Take 1 tablet (40 mg total) by mouth daily. Dose will need to be adjusted based on the amount of water you are drinking and how much weight you are gaining. What changed:  when to take this  additional instructions   Melatonin 10 MG Tabs  Take 10 mg by mouth at bedtime.   multivitamin with minerals Tabs tablet Take 1 tablet by mouth daily.   pantoprazole 20 MG tablet Commonly known as:  PROTONIX Take 1 tablet (20 mg total) by mouth daily before breakfast.   potassium chloride SA 20 MEQ tablet Commonly known as:  K-DUR,KLOR-CON Take 1 tablet (20 mEq total) by mouth daily.   prednisoLONE 5 MG Tabs tablet Take 8 tablets (40 mg total) by mouth daily.   spironolactone 100 MG tablet Commonly known as:  ALDACTONE Take 2 tablets (200 mg total) by mouth daily.   thiamine 100 MG tablet Commonly known as:  VITAMIN  B-1 Take 1 tablet (100 mg total) by mouth daily.      Follow-up Information    Iva BoopGessner, Carl E, MD Follow up in 1 week(s).   Specialty:  Gastroenterology Contact information: [address] [place] [address] [phone]          Not on File   Procedures/Studies:  right thoracentesis  Dg Chest 1 View  Result Date: 03/19/2017 CLINICAL DATA:  Thoracentesis EXAM: CHEST 1 VIEW COMPARISON:  03/17/2017 FINDINGS: Right pleural effusion is markedly improved. No pneumothorax. Residual right basilar atelectasis. Left lung clear. Cardiac silhouette partially obscured. IMPRESSION: No pneumothorax post right thoracentesis. Electronically Signed   By: Jolaine ClickArthur  Hoss M.D.   On: 03/19/2017 11:13   Dg Chest 1 View  Result Date: 02/21/2017 CLINICAL DATA:  51 year old female status post ultrasound-guided right lung thoracentesis today. EXAM: CHEST 1 VIEW COMPARISON:  PA and lateral chest radiographs 1042 hours today. FINDINGS: PA view of the chest at 1519 hours. Significantly reduced volume of right pleural fluid with improved right mid and upper lung aeration. Up to moderate residual right pleural effusion. No pneumothorax. Right infrahilar air bronchograms. Possible right hilar contour enlargement. Other mediastinal contours are within normal limits. Visualized tracheal air column is within normal limits. Stable and negative left lung. No acute osseous abnormality identified. IMPRESSION: 1. Status post right thoracentesis with reduced right pleural effusion and no pneumothorax. Moderate residual with right lung base atelectasis or consolidation. 2. Questionable abnormal right hilar contour. If the pleural fluid analysis is unrevealing then recommend follow-up Chest CT (IV contrast preferred). Electronically Signed   By: Odessa FlemingH  Hall M.D.   On: 02/21/2017 15:37   Dg Chest 2 View  Result Date: 03/17/2017 CLINICAL DATA:  Shortness of breath. EXAM: CHEST  2 VIEW COMPARISON:  Chest x-ray 02/21/2017  FINDINGS: Large right-sided pleural effusion with overlying atelectasis. The left lung is relatively clear. The cardiac silhouette, mediastinal and hilar contours are grossly normal and stable. The bony thorax is intact. IMPRESSION: Large right pleural effusion. Electronically Signed   By: Rudie MeyerP.  Gallerani M.D.   On: 03/17/2017 15:55   Dg Chest 2 View  Result Date: 02/21/2017 CLINICAL DATA:  Short of breath.  Alcoholic liver disease EXAM: CHEST  2 VIEW COMPARISON:  Ultrasound abdomen 01/20/2017 FINDINGS: Large right pleural effusion. The majority the right lung is collapsed with small amount of aerated right upper lobe. Mediastinal structures shifted to the left. Note is made of a smaller right effusion on recent ultrasound. Left lung is clear without infiltrate or effusion. Heart size normal and vascularity normal. IMPRESSION: Very large right pleural effusion with collapse of the right lung. This may be due to liver failure. Also consider infection and tumor. Thoracentesis suggested. Electronically Signed   By: Marlan Palauharles  Clark M.D.   On: 02/21/2017 10:45   Mr Liver W Wo Contrast  Result Date: 02/22/2017 CLINICAL DATA:  Cirrhosis.  Shortness of breath for 2-3 days. EXAM: MRI ABDOMEN WITHOUT AND WITH CONTRAST TECHNIQUE: Multiplanar multisequence MR imaging of the abdomen was performed both before and after the administration of intravenous contrast. CONTRAST:  13mL MULTIHANCE GADOBENATE DIMEGLUMINE 529 MG/ML IV SOLN COMPARISON:  None. FINDINGS: Significantly diminished exam detail secondary to motion artifact. Patient felt short of breath which was worsened by anxiety. Lower chest: Large right pleural effusion is again noted. Hepatobiliary: Hypertrophy of the lateral segment of left lobe of liver identified. There is a macronodular contour of the liver compatible with cirrhosis. 6 mm arterial phase enhancing structure within the lateral segment of left lobe of liver is identified, image 47 of series 1101 no no  definite washout or capsule identified. Findings compatible with LR-3 lesion. Small cyst in the medial segment of left lobe of liver measures 5 mm. There is diffuse edema involving the wall of the gallbladder likely secondary to cirrhosis and portal venous hypertension. Possible tiny stones noted within the dependent portion of the gallbladder, image 29 of series 5 Pancreas: No mass, inflammatory changes, or other parenchymal abnormality identified. Spleen:  The spleen is enlarged measuring 15.7 cm Adrenals/Urinary Tract: Normal appearance of the adrenal glands. Arising from the posterior aspect of the right kidney there is a 2.3 cm exophytic structure which is T2 hypointense in T1 isointense. Avid enhancement following the IV administration of contrast material is noted. Unremarkable appearance of the left kidney. No hydronephrosis identified. Stomach/Bowel: The stomach appears normal. There is mild diffuse edema involving the central small bowel loops. No abnormal bowel dilatation noted. Vascular/Lymphatic: No abdominal aortic aneurysm. Small upper abdominal varicosities are identified. No adenopathy noted within the upper abdomen. Other: There is mild upper abdominal ascites. Diffuse body wall edema is noted. Musculoskeletal: No abnormal signal or enhancement identified from within the bone marrow. IMPRESSION: 1. Exam detail is diminished secondary to motion artifact. Patient felt short of breath which was worsened by anxiety. 2. Morphologic features of liver compatible with cirrhosis. There is stigmata of portal venous hypertension including splenomegaly, varicosities and abdominal ascites. 3. Arterial phase enhancing structure within the left lobe of liver measures less than 1 cm and is considered a LR-3, which is indeterminate. Followup imaging in 6 months with repeat liver protocol MRI is advised. 4. Indeterminate enhancing lesion arising from posterior cortex of right kidney is identified. Difficult to  assess due to motion artifact. Cannot rule out solid enhancing neoplasm such as renal cell carcinoma. Consider further evaluation with renal protocol CT which may be less susceptible to respiratory motion artifact. 5. Right pleural effusion. 6. Gallstones. Electronically Signed   By: Signa Kell M.D.   On: 02/22/2017 09:49   US Abdomen Limited  Result Date: 03/19/2017 CLINICAL DATA:  Evaluate for ascites EXAM: LIMITED ABDOMEN ULTRASOUND FOR ASCITES TECHNIQUE: Limited ultrasound survey for ascites was performed in all four abdominal quadrants. COMPARISON:  None. FINDINGS: There is no significant ascites in the abdomen. Paracentesis was not performed. IMPRESSION: No significant ascites in the abdomen. Electronically Signed   By: Jolaine Click M.D.   On: 03/19/2017 11:14   Ir Thoracentesis Asp Pleural Space W/img Guide  Result Date: 02/21/2017 INDICATION: Shortness of breath, right-sided pleural effusion. Request diagnostic and therapeutic thoracentesis. EXAM: ULTRASOUND GUIDED RIGHT THORACENTESIS MEDICATIONS: None. COMPLICATIONS: None immediate. Postprocedural chest x-ray negative for pneumothorax PROCEDURE: An ultrasound guided thoracentesis was thoroughly discussed with the patient and questions answered. The benefits, risks, alternatives and complications were also  discussed. The patient understands and wishes to proceed with the procedure. Written consent was obtained. Ultrasound was performed to localize and mark an adequate pocket of fluid in the right chest. The area was then prepped and draped in the normal sterile fashion. 1% Lidocaine was used for local anesthesia. Under ultrasound guidance a Safe-T-Centesis catheter was introduced. Thoracentesis was performed. The catheter was removed and a dressing applied. FINDINGS: A total of approximately 2.0 L of clear yellow fluid was removed. Samples were sent to the laboratory as requested by the clinical team. IMPRESSION: Successful ultrasound guided  right thoracentesis yielding 2.0 L of pleural fluid. Read by: Brayton El PA-C Electronically Signed   By: Irish Lack M.D.   On: 02/21/2017 15:53   US Thoracentesis Asp Pleural Space W/img Guide  Result Date: 03/19/2017 INDICATION: Right pleural effusion EXAM: ULTRASOUND GUIDED RIGHT THORACENTESIS MEDICATIONS: None. COMPLICATIONS: None immediate. PROCEDURE: An ultrasound guided thoracentesis was thoroughly discussed with the patient and questions answered. The benefits, risks, alternatives and complications were also discussed. The patient understands and wishes to proceed with the procedure. Written consent was obtained. Ultrasound was performed to localize and mark an adequate pocket of fluid in the right chest. The area was then prepped and draped in the normal sterile fashion. 1% Lidocaine was used for local anesthesia. Under ultrasound guidance a 6 Fr Safe-T-Centesis catheter was introduced. Thoracentesis was performed. The catheter was removed and a dressing applied. FINDINGS: A total of approximately 2.6 L of clear yellow fluid was removed. Samples were sent to the laboratory as requested by the clinical team. IMPRESSION: Successful ultrasound guided right thoracentesis yielding 2.6 L of pleural fluid. Electronically Signed   By: Jolaine Click M.D.   On: 03/19/2017 11:13       Discharge Exam: Vitals:   03/20/17 2206 03/21/17 0642  BP: (!) 98/52 (!) 104/52  Pulse: 93 86  Resp: 17 20  Temp: 97.7 F (36.5 C) 98.1 F (36.7 C)  SpO2: 99% 93%   Vitals:   03/20/17 1346 03/20/17 2206 03/21/17 0500 03/21/17 0642  BP:  (!) 98/52  (!) 104/52  Pulse:  93  86  Resp:  17  20  Temp:  97.7 F (36.5 C)  98.1 F (36.7 C)  TempSrc:  Oral  Oral  SpO2: 95% 99%  93%  Weight:   55.6 kg (122 lb 8 oz)   Height:        General: Pt is alert, awake, not in acute distress Cardiovascular: RRR, S1/S2 +, no rubs, no gallops Respiratory: CTA bilaterally, no wheezing, no rhonchi Abdominal: Soft, NT,  ND, bowel sounds + Extremities: no edema, no cyanosis    The results of significant diagnostics from this hospitalization (including imaging, microbiology, ancillary and laboratory) are listed below for reference.     Microbiology: Recent Results (from the past 240 hour(s))  Culture, body fluid-bottle     Status: None (Preliminary result)   Collection Time: 03/19/17 11:02 AM  Result Value Ref Range Status   Specimen Description PLEURAL RIGHT  Final   Special Requests NONE  Final   Culture NO GROWTH 1 DAY  Final   Report Status PENDING  Incomplete  Gram stain     Status: None   Collection Time: 03/19/17 11:02 AM  Result Value Ref Range Status   Specimen Description PLEURAL RIGHT  Final   Special Requests NONE  Final   Gram Stain   Final    RARE WBC PRESENT, PREDOMINANTLY MONONUCLEAR NO ORGANISMS SEEN  Report Status 03/20/2017 FINAL  Final     Labs: BNP (last 3 results)  Recent Labs  02/21/17 1157  BNP 280.6*   Basic Metabolic Panel:  Recent Labs Lab 03/18/17 0437 03/18/17 1241 03/19/17 0400 03/19/17 1817 03/21/17 0317  NA 128* 127* 127* 129* 129*  K 5.2* 5.4* 4.9 4.2 4.1  CL 101 101 99* 98* 99*  CO2 20* 18* 18* 20* 21*  GLUCOSE 109* 85 141* 162* 138*  BUN 14 16 20  25* 27*  CREATININE 0.96 1.12* 1.30* 1.37* 1.20*  CALCIUM 9.2 9.0 8.7* 8.7* 8.4*   Liver Function Tests:  Recent Labs Lab 03/17/17 2107 03/18/17 0437 03/18/17 1241 03/19/17 0400 03/19/17 1817  AST 228* 217* 207* 160* 135*  ALT 194* 184* 172* 158* 147*  ALKPHOS 116 108 99 111 106  BILITOT 30.0* 28.2* 29.6* 28.4* 28.4*  PROT 9.1* 8.7* 8.5* 8.5* 8.6*  ALBUMIN 2.0* 1.9* 2.0* 1.8* 1.7*   No results for input(s): LIPASE, AMYLASE in the last 168 hours.  Recent Labs Lab 03/18/17 1325  AMMONIA 22   CBC:  Recent Labs Lab 03/14/17 1042 03/17/17 1530 03/18/17 0437 03/19/17 0400  WBC 13.1* 10.2 10.4 9.4  NEUTROABS 10.0*  --   --   --   HGB 9.7* 8.7* 8.5* 8.3*  HCT 28.8* 26.2*  25.2* 24.1*  MCV 97.9 95.6 93.7 94.1  PLT 162.0 123* 123* 117*   Cardiac Enzymes: No results for input(s): CKTOTAL, CKMB, CKMBINDEX, TROPONINI in the last 168 hours. BNP: Invalid input(s): POCBNP CBG: No results for input(s): GLUCAP in the last 168 hours. D-Dimer No results for input(s): DDIMER in the last 72 hours. Hgb A1c No results for input(s): HGBA1C in the last 72 hours. Lipid Profile No results for input(s): CHOL, HDL, LDLCALC, TRIG, CHOLHDL, LDLDIRECT in the last 72 hours. Thyroid function studies No results for input(s): TSH, T4TOTAL, T3FREE, THYROIDAB in the last 72 hours.  Invalid input(s): FREET3 Anemia work up No results for input(s): VITAMINB12, FOLATE, FERRITIN, TIBC, IRON, RETICCTPCT in the last 72 hours. Urinalysis No results found for: COLORURINE, APPEARANCEUR, LABSPEC, PHURINE, GLUCOSEU, HGBUR, BILIRUBINUR, KETONESUR, PROTEINUR, UROBILINOGEN, NITRITE, LEUKOCYTESUR Sepsis Labs Invalid input(s): PROCALCITONIN,  WBC,  LACTICIDVEN Microbiology Recent Results (from the past 240 hour(s))  Culture, body fluid-bottle     Status: None (Preliminary result)   Collection Time: 03/19/17 11:02 AM  Result Value Ref Range Status   Specimen Description PLEURAL RIGHT  Final   Special Requests NONE  Final   Culture NO GROWTH 1 DAY  Final   Report Status PENDING  Incomplete  Gram stain     Status: None   Collection Time: 03/19/17 11:02 AM  Result Value Ref Range Status   Specimen Description PLEURAL RIGHT  Final   Special Requests NONE  Final   Gram Stain   Final    RARE WBC PRESENT, PREDOMINANTLY MONONUCLEAR NO ORGANISMS SEEN    Report Status 03/20/2017 FINAL  Final     Time coordinating discharge: Over 30 minutes  SIGNED:   Calvert Cantor, MD  [facility] 03/21/2017, 8:31 AM Pager   If 7PM-7AM, please contact night-coverage [email or web address] Password [email or web address]

## 2017-03-21 NOTE — Progress Notes (Signed)
Rhonda Haley to be D/C'd Home per MD order.  Discussed with the patient and all questions fully answered.  VSS, Skin clean, dry and intact without evidence of skin break down, no evidence of skin tears noted. IV catheter discontinued intact. Site without signs and symptoms of complications. Dressing and pressure applied.  An After Visit Summary was printed and given to the patient. Patient received prescription.  D/c education completed with patient/family including follow up instructions, medication list, d/c activities limitations if indicated, with other d/c instructions as indicated by MD - patient able to verbalize understanding, all questions fully answered.   Patient instructed to return to ED, call 911, or call MD for any changes in condition.   Patient escorted via WC, and D/C home via private auto.  [name] Mikhaila Roh 03/21/2017 1:16 PM

## 2017-03-24 LAB — CULTURE, BODY FLUID W GRAM STAIN -BOTTLE

## 2017-03-24 LAB — CULTURE, BODY FLUID-BOTTLE: CULTURE: NO GROWTH

## 2017-03-28 ENCOUNTER — Ambulatory Visit (INDEPENDENT_AMBULATORY_CARE_PROVIDER_SITE_OTHER)
Admission: RE | Admit: 2017-03-28 | Discharge: 2017-03-28 | Disposition: A | Discharge status: Home / Self Care | Payer: 59 | Source: Ambulatory Visit | Attending: Internal Medicine | Admitting: Internal Medicine

## 2017-03-28 ENCOUNTER — Other Ambulatory Visit (INDEPENDENT_AMBULATORY_CARE_PROVIDER_SITE_OTHER): Payer: 59

## 2017-03-28 ENCOUNTER — Encounter: Payer: Self-pay | Admitting: Internal Medicine

## 2017-03-28 ENCOUNTER — Ambulatory Visit (INDEPENDENT_AMBULATORY_CARE_PROVIDER_SITE_OTHER): Payer: 59 | Admitting: Internal Medicine

## 2017-03-28 DIAGNOSIS — J948 Other specified pleural conditions: Secondary | ICD-10-CM | POA: Diagnosis not present

## 2017-03-28 DIAGNOSIS — F1020 Alcohol dependence, uncomplicated: Secondary | ICD-10-CM | POA: Diagnosis not present

## 2017-03-28 DIAGNOSIS — K709 Alcoholic liver disease, unspecified: Secondary | ICD-10-CM

## 2017-03-28 DIAGNOSIS — K7031 Alcoholic cirrhosis of liver with ascites: Secondary | ICD-10-CM

## 2017-03-28 DIAGNOSIS — R93429 Abnormal radiologic findings on diagnostic imaging of unspecified kidney: Secondary | ICD-10-CM

## 2017-03-28 DIAGNOSIS — R932 Abnormal findings on diagnostic imaging of liver and biliary tract: Secondary | ICD-10-CM | POA: Diagnosis not present

## 2017-03-28 LAB — COMPREHENSIVE METABOLIC PANEL
ALK PHOS: 127 U/L — AB (ref 39–117)
ALT: 125 U/L — AB (ref 0–35)
AST: 88 U/L — AB (ref 0–37)
Albumin: 2.6 g/dL — ABNORMAL LOW (ref 3.5–5.2)
BILIRUBIN TOTAL: 18.3 mg/dL — AB (ref 0.2–1.2)
BUN: 23 mg/dL (ref 6–23)
CO2: 28 mEq/L (ref 19–32)
Calcium: 8.9 mg/dL (ref 8.4–10.5)
Chloride: 94 mEq/L — ABNORMAL LOW (ref 96–112)
Creatinine, Ser: 0.97 mg/dL (ref 0.40–1.20)
GFR: 63.74 mL/min (ref >60.00)
GLUCOSE: 130 mg/dL — AB (ref 70–99)
Potassium: 4.9 mEq/L (ref 3.5–5.1)
SODIUM: 128 meq/L — AB (ref 135–145)
TOTAL PROTEIN: 7.9 g/dL (ref 6.0–8.3)

## 2017-03-28 LAB — CBC WITH DIFFERENTIAL/PLATELET
BASOS ABS: 0 10*3/uL (ref 0.0–0.1)
Basophils Relative: 0.1 % (ref 0.0–3.0)
EOS ABS: 0.1 10*3/uL (ref 0.0–0.7)
Eosinophils Relative: 0.3 % (ref 0.0–5.0)
HCT: 30 % — ABNORMAL LOW (ref 36.0–46.0)
Hemoglobin: 10 g/dL — ABNORMAL LOW (ref 12.0–15.0)
LYMPHS ABS: 2.7 10*3/uL (ref 0.7–4.0)
Lymphocytes Relative: 11.5 % — ABNORMAL LOW (ref 12.0–46.0)
MCHC: 33.4 g/dL (ref 30.0–36.0)
MCV: 101.2 fl — ABNORMAL HIGH (ref 78.0–100.0)
MONO ABS: 1.7 10*3/uL — AB (ref 0.1–1.0)
MONOS PCT: 7.4 % (ref 3.0–12.0)
NEUTROS PCT: 80.7 % — AB (ref 43.0–77.0)
Neutro Abs: 18.8 10*3/uL — ABNORMAL HIGH (ref 1.4–7.7)
Platelets: 129 10*3/uL — ABNORMAL LOW (ref 150.0–400.0)
RBC: 2.97 Mil/uL — AB (ref 3.87–5.11)
RDW: 17 % — ABNORMAL HIGH (ref 11.5–15.5)
WBC: 23.3 10*3/uL (ref 4.0–10.5)

## 2017-03-28 LAB — PROTIME-INR
INR: 1.7 ratio — ABNORMAL HIGH (ref 0.8–1.0)
Prothrombin Time: 18.7 s — ABNORMAL HIGH (ref 9.6–13.1)

## 2017-03-28 NOTE — Assessment & Plan Note (Deleted)
labs

## 2017-03-28 NOTE — Assessment & Plan Note (Signed)
CXR

## 2017-03-28 NOTE — Progress Notes (Signed)
Rhonda Haley 51 y.o. 03/23/1966 [id number]  Assessment & Plan:   Encounter Diagnoses  Name Primary?  . Alcoholic cirrhosis of liver with ascites (HCC)   . Hydrothorax- hepatic   . Alcoholism (HCC)   . Abnormal MRI, liver   . Abnormal MRI, kidney     She seems improved but is still significantly ill. Still has significant right pleural effusion (hepatic hydrothorax). I'm going to check her labs and see about increasing her diuretics. Hopefully she can stay out of the hospital. I explained to her the significance of alcohol cessation and that her liver disease is so severe that we need to contemplate the possibility of a liver transplant at some point.  We'll follow-up kidney and liver lesions at a later date nothing could be done about these right now anyway we need to get the ascites and effusion under better control and the jaundice improved.  I have asked [facility] to [facility] about en[facility] in a counseling program.  Labs checked today have returned and listed below  Lab Results  Component Value Date   WBC 23.3 Repeated and verified X2. (HH) 03/28/2017   HGB 10.0 (L) 03/28/2017   HCT 30.0 (L) 03/28/2017   MCV 101.2 (H) 03/28/2017   PLT 129.0 (L) 03/28/2017     Chemistry      Component Value Date/Time   NA 128 (L) 03/28/2017 1619   K 4.9 03/28/2017 1619   CL 94 (L) 03/28/2017 1619   CO2 28 03/28/2017 1619   BUN 23 03/28/2017 1619   CREATININE 0.97 03/28/2017 1619      Component Value Date/Time   CALCIUM 8.9 03/28/2017 1619   ALKPHOS 127 (H) 03/28/2017 1619   AST 88 (H) 03/28/2017 1619   ALT 125 (H) 03/28/2017 1619   BILITOT 18.3 (H) 03/28/2017 1619     Lab Results  Component Value Date   INR 1.7 (H) 03/28/2017   INR 2.03 03/19/2017   INR 2.09 03/18/2017   Hepatitis A antibody total is negative  Hepatitis B surface antibody less than 5  So liver disease improved with less jaundice and her INR is better. Presumably this is from the prednisolone  treating a component of alcoholic hepatitis. There has been some question of whether there is an autoimmune component at 1.2 she's had some weakly positive autoimmune labs. Liver biopsy might need to be considered.  Still hyponatremic. We need to push her diuretics more so we'll go to spironolactone 300 mg in the morning and furosemide 80 mg in the morning. She has follow-up next week in about 10 days and we will check labs before that.  Need to get her immunized for hepatitis pneumonia etc. will work on that.  Subjective:   Chief Complaint: jaundice, cirrhosis, pleural effusion  HPI Doing better after hosiptal and 2 thoracenteses Working Some dyspnea but working  [employer] on prednisolone Says not drinking  Wt Readings from Last 3 Encounters:  03/28/17 124 lb (56.2 kg)  03/21/17 122 lb 8 oz (55.6 kg)  03/09/17 129 lb 6 oz (58.7 kg)    No Known Allergies Current Meds  Medication Sig  . escitalopram (LEXAPRO) 20 MG tablet Take 20 mg by mouth daily.  . feeding supplement, ENSURE ENLIVE, (ENSURE ENLIVE) LIQD Take 237 mLs by mouth 2 (two) times daily between meals.  . ferrous sulfate 325 (65 FE) MG tablet Take 1 tablet (325 mg total) by mouth 2 (two) times daily with a meal.  . Melatonin 10 MG  TABS Take 10 mg by mouth at bedtime.  . Multiple Vitamin (MULTIVITAMIN WITH MINERALS) TABS tablet Take 1 tablet by mouth daily.  . pantoprazole (PROTONIX) 20 MG tablet Take 1 tablet (20 mg total) by mouth daily before breakfast.  . prednisoLONE 5 MG TABS tablet Take 8 tablets (40 mg total) by mouth daily.  Marland Kitchen thiamine (VITAMIN B-1) 100 MG tablet Take 1 tablet (100 mg total) by mouth daily.  . [DISCONTINUED] furosemide (LASIX) 40 MG tablet Take 1 tablet (40 mg total) by mouth daily. Dose will need to be adjusted based on the amount of water you are drinking and how much weight you are gaining.   Past Medical History:  Diagnosis Date  . Alcoholic liver disease (HCC) 01/19/2017  . Alcoholism (HCC)    . Allergy   . Anxiety   . Blood transfusion without reported diagnosis   . Symptomatic anemia 01/19/2017   Past Surgical History:  Procedure Laterality Date  . COLONOSCOPY  01/2017  . ESOPHAGOGASTRODUODENOSCOPY  01/2017  . FERTILITY SURGERY  2003   in vitro fertilization  . IR THORACENTESIS ASP PLEURAL SPACE W/IMG GUIDE  02/21/2017  . WISDOM TOOTH EXTRACTION     age 67   Social History   Social History  . Marital status: Married    Spouse name: N/A  . Number of children: N/A  . Years of education: N/A   Occupational History  . Not on file.   Social History Main Topics  . Smoking status: Former Smoker    Packs/day: 0.50    Years: 28.00    Types: Cigarettes    Quit date: 03/03/2017  . Smokeless tobacco: Never Used  . Alcohol use No     Comment: quit x 6 wks ago  . Drug use: No  . Sexual activity: Yes   Other Topics Concern  . Not on file   Social History Narrative   Married   Employed at [employer]   family history includes Brain cancer in her mother.   Review of Systems As above + fatigue  Objective:   Physical Exam @BP  (!) 84/44 (BP Location: Right Arm, Patient Position: Sitting, Cuff Size: Normal)   Pulse (!) 101   Ht 5' 8.5" (1.74 m)   Wt 124 lb (56.2 kg)   BMI 18.58 kg/m @  General:  Thin, asthenic Eyes:  icteric. Neck:   supple w/o thyromegaly or mass.  Lungs:  decreased BS and dullness 1/2 up on right  Left clear. Heart:  S1S2, no rubs, murmurs, gallops. Abdomen:  soft, non-tender, firm liver RUQ mod ascites.  Wxtremities:   no edema, cyanosis or clubbing Skin   jaundiced with numerous spider angiomata Neuro:  A&O x 3.  Psych:  appropriate mood and  Affect.   Data Reviewed: Hospital notes, labs, etc

## 2017-03-28 NOTE — Patient Instructions (Signed)
Your physician has requested that you go to the basement for the following lab work before leaving today: CBC/diff, CMET, PT/INR  Please go by the x-ray department in the basement and get a Chest X-ray before leaving.   We will be in touch with results and plans.   Contact the [facility] at [phone]. They are located at [address] [place] [address]   I appreciate the opportunity to care for you. Stan Head, MD, [facility]

## 2017-03-29 ENCOUNTER — Other Ambulatory Visit: Payer: Self-pay

## 2017-03-29 DIAGNOSIS — K7031 Alcoholic cirrhosis of liver with ascites: Secondary | ICD-10-CM

## 2017-03-29 LAB — HEPATITIS B SURFACE ANTIBODY, QUANTITATIVE: Hepatitis B-Post: <5 m[IU]/mL — ABNORMAL LOW (ref >=10)

## 2017-03-29 LAB — HEPATITIS A ANTIBODY, TOTAL: Hep A Total Ab: REACTIVE — AB

## 2017-03-29 MED ORDER — SPIRONOLACTONE 100 MG PO TABS: 300.0000 mg | ORAL_TABLET | Freq: Every day | ORAL | 1 refills | Status: DC

## 2017-03-29 MED ORDER — FUROSEMIDE 40 MG PO TABS: 80.0000 mg | ORAL_TABLET | Freq: Every day | ORAL | 1 refills | Status: DC

## 2017-03-29 NOTE — Progress Notes (Signed)
Pleural effusion is worse but she seems to be making it as an outpatient  Labs are better overall, elevated WBC is from steroids  New Rxs on diuretics  Furosemide 80 mg in AM - 40 mg tabs # 60 1 RF and spironolactone 300 mg in AM 100 mg tabs # 90 1 RF  Ask her to do CMET an INR on 8/28 - I see her again 8/29

## 2017-03-30 ENCOUNTER — Encounter: Payer: Self-pay | Admitting: Internal Medicine

## 2017-03-30 ENCOUNTER — Telehealth: Payer: Self-pay | Admitting: Internal Medicine

## 2017-03-30 DIAGNOSIS — R93429 Abnormal radiologic findings on diagnostic imaging of unspecified kidney: Secondary | ICD-10-CM | POA: Insufficient documentation

## 2017-03-30 DIAGNOSIS — F1020 Alcohol dependence, uncomplicated: Secondary | ICD-10-CM | POA: Insufficient documentation

## 2017-03-30 NOTE — Telephone Encounter (Signed)
Spoke with [name] and questions about her medicines answered. She wanted to make sure she was on the right dose.

## 2017-03-30 NOTE — Assessment & Plan Note (Signed)
Follow-up at a later date

## 2017-03-30 NOTE — Telephone Encounter (Signed)
What are her questions? I have changed the medications and increase the doses

## 2017-03-30 NOTE — Assessment & Plan Note (Signed)
Need to follow-up at a later date will discuss on follow-up visit

## 2017-04-03 ENCOUNTER — Other Ambulatory Visit: Payer: Self-pay | Admitting: Gastroenterology

## 2017-04-03 NOTE — Telephone Encounter (Signed)
Please advise 

## 2017-04-03 NOTE — Telephone Encounter (Signed)
This is an old prescription, I had prescribed 3 100 mg tablets daily when she was in the office last week please make sure she has the right 1

## 2017-04-03 NOTE — Telephone Encounter (Signed)
Yes patient has correct dose, this was an automatic refill request.

## 2017-04-04 ENCOUNTER — Other Ambulatory Visit (INDEPENDENT_AMBULATORY_CARE_PROVIDER_SITE_OTHER): Payer: 59

## 2017-04-04 DIAGNOSIS — K7031 Alcoholic cirrhosis of liver with ascites: Secondary | ICD-10-CM

## 2017-04-04 LAB — COMPREHENSIVE METABOLIC PANEL
ALBUMIN: 2.5 g/dL — AB (ref 3.5–5.2)
ALK PHOS: 106 U/L (ref 39–117)
ALT: 125 U/L — ABNORMAL HIGH (ref 0–35)
AST: 101 U/L — ABNORMAL HIGH (ref 0–37)
BUN: 21 mg/dL (ref 6–23)
CO2: 27 mEq/L (ref 19–32)
CREATININE: 0.96 mg/dL (ref 0.40–1.20)
Calcium: 8.5 mg/dL (ref 8.4–10.5)
Chloride: 91 mEq/L — ABNORMAL LOW (ref 96–112)
GFR: 64.51 mL/min (ref >60.00)
Glucose, Bld: 103 mg/dL — ABNORMAL HIGH (ref 70–99)
POTASSIUM: 5 meq/L (ref 3.5–5.1)
SODIUM: 124 meq/L — AB (ref 135–145)
TOTAL PROTEIN: 6.6 g/dL (ref 6.0–8.3)
Total Bilirubin: 14.6 mg/dL — ABNORMAL HIGH (ref 0.2–1.2)

## 2017-04-04 LAB — PROTIME-INR
INR: 1.8 ratio — ABNORMAL HIGH (ref 0.8–1.0)
Prothrombin Time: 19.8 s — ABNORMAL HIGH (ref 9.6–13.1)

## 2017-04-05 ENCOUNTER — Ambulatory Visit (INDEPENDENT_AMBULATORY_CARE_PROVIDER_SITE_OTHER): Payer: 59 | Admitting: Internal Medicine

## 2017-04-05 ENCOUNTER — Encounter: Payer: Self-pay | Admitting: Internal Medicine

## 2017-04-05 DIAGNOSIS — F1020 Alcohol dependence, uncomplicated: Secondary | ICD-10-CM | POA: Diagnosis not present

## 2017-04-05 DIAGNOSIS — K7031 Alcoholic cirrhosis of liver with ascites: Secondary | ICD-10-CM | POA: Diagnosis not present

## 2017-04-05 DIAGNOSIS — J948 Other specified pleural conditions: Secondary | ICD-10-CM | POA: Diagnosis not present

## 2017-04-05 DIAGNOSIS — R93429 Abnormal radiologic findings on diagnostic imaging of unspecified kidney: Secondary | ICD-10-CM | POA: Diagnosis not present

## 2017-04-05 DIAGNOSIS — R932 Abnormal findings on diagnostic imaging of liver and biliary tract: Secondary | ICD-10-CM

## 2017-04-05 DIAGNOSIS — E871 Hypo-osmolality and hyponatremia: Secondary | ICD-10-CM

## 2017-04-05 MED ORDER — FUROSEMIDE 40 MG PO TABS: 60.0000 mg | ORAL_TABLET | Freq: Every day | ORAL | 1 refills | Status: AC

## 2017-04-05 MED ORDER — SPIRONOLACTONE 100 MG PO TABS: 250.0000 mg | ORAL_TABLET | Freq: Every day | ORAL | 1 refills | Status: DC

## 2017-04-05 NOTE — Assessment & Plan Note (Signed)
6 mm lesion - f/u later

## 2017-04-05 NOTE — Assessment & Plan Note (Signed)
Reviewed - plan f/u later

## 2017-04-05 NOTE — Progress Notes (Signed)
Rhonda Haley 51 y.o. 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 Decreased bilirubin, stable INR Stay on prednisolone F/U 2 weeks Ascites and pleural fluid and weight seem same Hyponatremia necessitates reducing diuretics so 250 mg spironolactone and 60 mg furosemide Recheck CMET 1 week [facility] phone # given again - pursue EtOH counseling - discussed need to do this Also brought up possibility of TIPS  Hydrothorax- hepatic About the same - asked if she thought she needed thoracentesis and she said no - able to lie flat today Too soon to say diuretics have failed but if she does not start improving more I think may need to try TIPS if possible  Abnormal MRI, liver 6 mm lesion - f/u later  Abnormal MRI, kidney Reviewed - plan f/u later  Hyponatremia Worse - probably from diuretics so will reduce doses  Alcoholism (HCC) recommended starting counseling again  I appreciate the opportunity to care for this patient.  WU:[id number], Christell Constant, MD   Subjective:   Chief Complaint: f/u jaundice, cirrhoisis, ascites/effusion  HPI Feels a bit weak - stable. Still working. Sleeping ok, able to lie flat Did not call [facility] - says she did not get #  Wt Readings from Last 3 Encounters:  04/05/17 123 lb 6.4 oz (56 kg)  03/28/17 124 lb (56.2 kg)  03/21/17 122 lb 8 oz (55.6 kg)    No Known Allergies Current Meds  Medication Sig  . escitalopram (LEXAPRO) 20 MG tablet Take 20 mg by mouth daily.  . feeding supplement, ENSURE ENLIVE, (ENSURE ENLIVE) LIQD Take 237 mLs by mouth 2 (two) times daily between meals.  . ferrous sulfate 325 (65 FE) MG tablet Take 1 tablet (325 mg total) by mouth 2 (two) times daily with a meal.  . furosemide (LASIX) 40 MG tablet Take 1.5 tablets (60 mg total) by mouth daily.  . Melatonin 10 MG TABS Take 10 mg by mouth at bedtime.  . Multiple Vitamin  (MULTIVITAMIN WITH MINERALS) TABS tablet Take 1 tablet by mouth daily.  . pantoprazole (PROTONIX) 20 MG tablet Take 1 tablet (20 mg total) by mouth daily before breakfast.  . prednisoLONE 5 MG TABS tablet Take 8 tablets (40 mg total) by mouth daily.  Marland Kitchen spironolactone (ALDACTONE) 100 MG tablet Take 2.5 tablets (250 mg total) by mouth daily.  Marland Kitchen thiamine (VITAMIN B-1) 100 MG tablet Take 1 tablet (100 mg total) by mouth daily.  . [DISCONTINUED] furosemide (LASIX) 40 MG tablet Take 2 tablets (80 mg total) by mouth daily.  . [DISCONTINUED] spironolactone (ALDACTONE) 100 MG tablet Take 3 tablets (300 mg total) by mouth daily.   Past Medical History:  Diagnosis Date  . 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 There is a question of autoimmune component with weakly positive anti-smooth muscle antibody and an ANA Child C question component of hepatitis taking that worse. No varices, complicated by hepatic hydrothorax and ascites. No encephalopathy. Prednisolone started early August 2018  . Alcoholism (HCC)   . Allergy   . Anxiety   . Blood transfusion without reported diagnosis   . 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 Symptomatic anemia 01/19/2017   Past Surgical History:  Procedure Laterality Date  . COLONOSCOPY  01/2017  . ESOPHAGOGASTRODUODENOSCOPY  01/2017  . FERTILITY SURGERY  2003   in vitro fertilization  . IR THORACENTESIS ASP PLEURAL SPACE  W/IMG GUIDE  02/21/2017  . WISDOM TOOTH EXTRACTION     age 51   Social History   Social History  . Marital status: Married    Spouse name: N/A  . Number of children: 0  . Years of education: N/A   Occupational History  . [employer]   Social History Main Topics  . Smoking status: Former Smoker    Packs/day: 0.50    Years: 28.00    Types: Cigarettes    Quit date: 03/03/2017  . Smokeless tobacco: Never Used  . Alcohol use No     Comment: quit  Spring/Summer 2018  . Drug use: No  . Sexual activity: Yes   Other Topics Concern  . Not on file   Social History Narrative   Married   Employed at [employer]   family history includes Brain cancer in her mother.   Review of Systems As above  Objective:   Physical Exam BP (!) 80/40   Pulse 72   Ht 5' 8.5" (1.74 m)   Wt 123 lb 6.4 oz (56 kg)   BMI 18.49 kg/m  Remains significantly jaundiced in the eyes and skin Lungs decreased breath sounds half up with dullness on the right clear on the left Heart S1-S2 no rubs murmurs or gallops Abdomen mild to moderate ascites firm nodular liver right upper quadrant Extremities trace lower extremity edema She is alert and oriented 3 as appropriate mood and affect   Lab Results  Component Value Date   ALT 125 (H) 04/04/2017   AST 101 (H) 04/04/2017   ALKPHOS 106 04/04/2017   BILITOT 14.6 (H) 04/04/2017   Lab Results  Component Value Date   CREATININE 0.96 04/04/2017   BUN 21 04/04/2017   NA 124 (L) 04/04/2017   K 5.0 04/04/2017   CL 91 (L) 04/04/2017   CO2 27 04/04/2017   Lab Results  Component Value Date   INR 1.8 (H) 04/04/2017   INR 1.7 (H) 03/28/2017   INR 2.03 03/19/2017

## 2017-04-05 NOTE — Assessment & Plan Note (Signed)
Worse - probably from diuretics so will reduce doses

## 2017-04-05 NOTE — Assessment & Plan Note (Addendum)
About the same - asked if she thought she needed thoracentesis and she said no - able to lie flat today Too soon to say diuretics have failed but if she does not start improving more I think may need to try TIPS if possible

## 2017-04-05 NOTE — Patient Instructions (Addendum)
Please adjust your medicines as Dr Leone PayorGessner advised today, see med list.   Come next Weds. 04/12/17 to have lab work drawn, no appointment needed.  While you are here please come up and let us get a weight, ask for PJ   Come back and see us September 13th at 11:15AM   Please contact the [facility] [phone] , located at [address] [place] [address]   I appreciate the opportunity to care for you. Stan Headarl Gessner, MD, [facility]

## 2017-04-05 NOTE — Assessment & Plan Note (Signed)
recommended starting counseling again

## 2017-04-05 NOTE — Assessment & Plan Note (Addendum)
Decreased bilirubin, stable INR Stay on prednisolone F/U 2 weeks Ascites and pleural fluid and weight seem same Hyponatremia necessitates reducing diuretics so 250 mg spironolactone and 60 mg furosemide Recheck CMET 1 week [facility] phone # given again - pursue EtOH counseling - discussed need to do this Also brought up possibility of TIPS

## 2017-04-07 ENCOUNTER — Other Ambulatory Visit: Payer: Self-pay | Admitting: Internal Medicine

## 2017-04-07 MED ORDER — PREDNISOLONE 5 MG PO TABS: 40.0000 mg | ORAL_TABLET | Freq: Every day | ORAL | 0 refills | Status: DC

## 2017-04-07 NOTE — Telephone Encounter (Signed)
Per last office visit note patient to continue prenisolone at current dose. Rx sent to patient's pharmacy and patient notified.

## 2017-04-12 ENCOUNTER — Telehealth: Payer: Self-pay

## 2017-04-12 ENCOUNTER — Other Ambulatory Visit (INDEPENDENT_AMBULATORY_CARE_PROVIDER_SITE_OTHER): Payer: 59

## 2017-04-12 DIAGNOSIS — K7031 Alcoholic cirrhosis of liver with ascites: Secondary | ICD-10-CM | POA: Diagnosis not present

## 2017-04-12 LAB — COMPREHENSIVE METABOLIC PANEL
ALBUMIN: 2.6 g/dL — AB (ref 3.5–5.2)
ALK PHOS: 124 U/L — AB (ref 39–117)
ALT: 107 U/L — ABNORMAL HIGH (ref 0–35)
AST: 79 U/L — ABNORMAL HIGH (ref 0–37)
BUN: 22 mg/dL (ref 6–23)
CALCIUM: 8.7 mg/dL (ref 8.4–10.5)
CO2: 23 mEq/L (ref 19–32)
Chloride: 92 mEq/L — ABNORMAL LOW (ref 96–112)
Creatinine, Ser: 0.89 mg/dL (ref 0.40–1.20)
GFR: 70.39 mL/min (ref >60.00)
Glucose, Bld: 160 mg/dL — ABNORMAL HIGH (ref 70–99)
Potassium: 4 mEq/L (ref 3.5–5.1)
Sodium: 125 mEq/L — ABNORMAL LOW (ref 135–145)
TOTAL PROTEIN: 6.4 g/dL (ref 6.0–8.3)
Total Bilirubin: 11.6 mg/dL — ABNORMAL HIGH (ref 0.2–1.2)

## 2017-04-12 NOTE — Telephone Encounter (Signed)
Wight down that is good means fluid coming off most likely  She was a bit hoarse recently I noticed - probably not from medications - is that what she is referring to - hoarseness?

## 2017-04-12 NOTE — Telephone Encounter (Signed)
Pt came to the clinic today for a weight check ( 117.2 lbs) she states she is doing well by has noticed her voice has changed and would like to know if this is related to the medications she is now on. Please advise.

## 2017-04-12 NOTE — Telephone Encounter (Signed)
Left message for patient to call back  

## 2017-04-13 NOTE — Telephone Encounter (Signed)
yes

## 2017-04-13 NOTE — Telephone Encounter (Signed)
Patient notified of response.  She is asked to see her primary care about voice changes.  She did not sound hoarse this am and her voice was strong.

## 2017-04-14 ENCOUNTER — Telehealth: Payer: Self-pay | Admitting: Internal Medicine

## 2017-04-14 ENCOUNTER — Other Ambulatory Visit: Payer: Self-pay

## 2017-04-14 DIAGNOSIS — K7010 Alcoholic hepatitis without ascites: Secondary | ICD-10-CM

## 2017-04-14 NOTE — Telephone Encounter (Signed)
See results notes for additional notes

## 2017-04-19 ENCOUNTER — Other Ambulatory Visit (INDEPENDENT_AMBULATORY_CARE_PROVIDER_SITE_OTHER): Payer: 59

## 2017-04-19 ENCOUNTER — Telehealth: Payer: Self-pay

## 2017-04-19 DIAGNOSIS — K7010 Alcoholic hepatitis without ascites: Secondary | ICD-10-CM | POA: Diagnosis not present

## 2017-04-19 LAB — PROTIME-INR
INR: 1.7 ratio — ABNORMAL HIGH (ref 0.8–1.0)
PROTHROMBIN TIME: 18.5 s — AB (ref 9.6–13.1)

## 2017-04-19 LAB — COMPREHENSIVE METABOLIC PANEL
ALT: 118 U/L — AB (ref 0–35)
AST: 76 U/L — ABNORMAL HIGH (ref 0–37)
Albumin: 2.7 g/dL — ABNORMAL LOW (ref 3.5–5.2)
Alkaline Phosphatase: 128 U/L — ABNORMAL HIGH (ref 39–117)
BUN: 23 mg/dL (ref 6–23)
CALCIUM: 8.8 mg/dL (ref 8.4–10.5)
CHLORIDE: 92 meq/L — AB (ref 96–112)
CO2: 20 meq/L (ref 19–32)
Creatinine, Ser: 0.83 mg/dL (ref 0.40–1.20)
GFR: 76.29 mL/min (ref >60.00)
GLUCOSE: 135 mg/dL — AB (ref 70–99)
POTASSIUM: 4.5 meq/L (ref 3.5–5.1)
Sodium: 124 mEq/L — ABNORMAL LOW (ref 135–145)
Total Bilirubin: 11.3 mg/dL — ABNORMAL HIGH (ref 0.2–1.2)
Total Protein: 6.5 g/dL (ref 6.0–8.3)

## 2017-04-19 LAB — CBC WITH DIFFERENTIAL/PLATELET
BASOS ABS: 0.1 10*3/uL (ref 0.0–0.1)
BASOS PCT: 0.2 % (ref 0.0–3.0)
EOS ABS: 0 10*3/uL (ref 0.0–0.7)
Eosinophils Relative: 0 % (ref 0.0–5.0)
HEMATOCRIT: 28.2 % — AB (ref 36.0–46.0)
Hemoglobin: 9.7 g/dL — ABNORMAL LOW (ref 12.0–15.0)
LYMPHS ABS: 0.7 10*3/uL (ref 0.7–4.0)
LYMPHS PCT: 2.4 % — AB (ref 12.0–46.0)
MCHC: 34.3 g/dL (ref 30.0–36.0)
MCV: 103 fl — AB (ref 78.0–100.0)
MONOS PCT: 5.4 % (ref 3.0–12.0)
Monocytes Absolute: 1.7 10*3/uL — ABNORMAL HIGH (ref 0.1–1.0)
NEUTROS ABS: 28.3 10*3/uL — AB (ref 1.4–7.7)
NEUTROS PCT: 92 % — AB (ref 43.0–77.0)
PLATELETS: 180 10*3/uL (ref 150.0–400.0)
RBC: 2.73 Mil/uL — ABNORMAL LOW (ref 3.87–5.11)
RDW: 15.6 % — AB (ref 11.5–15.5)
WBC: 29.9 10*3/uL (ref 4.0–10.5)

## 2017-04-19 NOTE — Telephone Encounter (Signed)
Lab called with a critical result of WBC is 29.9.

## 2017-04-20 ENCOUNTER — Telehealth: Payer: Self-pay | Admitting: Internal Medicine

## 2017-04-20 ENCOUNTER — Ambulatory Visit (INDEPENDENT_AMBULATORY_CARE_PROVIDER_SITE_OTHER): Payer: 59 | Admitting: Internal Medicine

## 2017-04-20 ENCOUNTER — Encounter: Payer: Self-pay | Admitting: Internal Medicine

## 2017-04-20 ENCOUNTER — Other Ambulatory Visit: Payer: Self-pay | Admitting: Gastroenterology

## 2017-04-20 VITALS — BP 80/50 | HR 84 | Ht 68.5 in | Wt 117.2 lb

## 2017-04-20 DIAGNOSIS — R93429 Abnormal radiologic findings on diagnostic imaging of unspecified kidney: Secondary | ICD-10-CM | POA: Diagnosis not present

## 2017-04-20 DIAGNOSIS — R932 Abnormal findings on diagnostic imaging of liver and biliary tract: Secondary | ICD-10-CM | POA: Diagnosis not present

## 2017-04-20 DIAGNOSIS — J948 Other specified pleural conditions: Secondary | ICD-10-CM

## 2017-04-20 DIAGNOSIS — K7031 Alcoholic cirrhosis of liver with ascites: Secondary | ICD-10-CM | POA: Diagnosis not present

## 2017-04-20 DIAGNOSIS — E871 Hypo-osmolality and hyponatremia: Secondary | ICD-10-CM | POA: Diagnosis not present

## 2017-04-20 DIAGNOSIS — E43 Unspecified severe protein-calorie malnutrition: Secondary | ICD-10-CM

## 2017-04-20 MED ORDER — PREDNISOLONE 5 MG PO TABS: 30.0000 mg | ORAL_TABLET | Freq: Every day | ORAL | 0 refills | Status: DC

## 2017-04-20 MED ORDER — SPIRONOLACTONE 100 MG PO TABS: 200.0000 mg | ORAL_TABLET | Freq: Every day | ORAL | 1 refills | Status: AC

## 2017-04-20 MED ORDER — VITAMIN B-1 100 MG PO TABS: 100.0000 mg | ORAL_TABLET | Freq: Every day | ORAL | 3 refills | Status: AC

## 2017-04-20 MED ORDER — VITAMIN B-1 100 MG PO TABS: 100.0000 mg | ORAL_TABLET | Freq: Every day | ORAL | 0 refills | Status: DC

## 2017-04-20 NOTE — Telephone Encounter (Signed)
Left patient detailed message that thiamine sent in. Okay per Dr Leone PayorGessner , was probably denied by one of the hospital ist by accident.

## 2017-04-20 NOTE — Patient Instructions (Addendum)
  We are going to set you up to see Rhonda Majorawn Drazek NP at [facility]. Their phone # [phone].  Please call us with your appointment date/time.   Your follow up appointment with us will be set up once we know when your [facility] appointment is to be.   Please call the [facility] as discussed before.   Taper your prednisone as follows: 30mg  starting tomorrow for 4 Days then go to 20mg  for 4 days, then 10mg  for 4 days, then 5mg  for 4 days then STOP.   See med list for new directions on your fluid pills directions.   I appreciate the opportunity to care for you. Stan Headarl Gessner, MD, [facility]

## 2017-04-20 NOTE — Progress Notes (Signed)
Rhonda Haley 51 y.o. 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 Taper prednisolone over 16 days Refer to [facility] Arrange f/u me after I know when she will be seen there. She should be considered for transplant evaluation but needs to enroll in alcohol counseling  Hydrothorax- hepatic Better, as good as she has been. Reduce spironolactone to 200 mg to see if Na will get better   Abnormal MRI, kidney Small lesion - malignancy possible Have elected to hold off on further imaging now - I think repeat MR later this year may make most sense - suspect she will be able to hold breath okthis time as resp status much better  Protein-calorie malnutrition, severe encouragred liberal low Na diet w/ supplements  Abnormal MRI, liver Tiny lesion will see if it is changing with MR later this year   Subjective:   Chief Complaint: f/u cirrhois  HPI Feeling better overall but wants to get off prednisolone and get rid of "quavering voice". Able to lie flat and sleep better but still DOE. Denies alcohol use. Has not yet called [facility]. Still working. Trying to eat well.  Wt Readings from Last 3 Encounters:  04/20/17 117 lb 3.2 oz (53.2 kg)  04/05/17 123 lb 6.4 oz (56 kg)  03/28/17 124 lb (56.2 kg)    No Known Allergies Current Meds  Medication Sig  . escitalopram (LEXAPRO) 20 MG tablet Take 20 mg by mouth daily.  . feeding supplement, ENSURE ENLIVE, (ENSURE ENLIVE) LIQD Take 237 mLs by mouth 2 (two) times daily between meals.  . ferrous sulfate 325 (65 FE) MG tablet Take 1 tablet (325 mg total) by mouth 2 (two) times daily with a meal.  . furosemide (LASIX) 40 MG tablet Take 1.5 tablets (60 mg total) by mouth daily.  . Melatonin 10 MG TABS Take 10 mg by mouth at  bedtime.  . Multiple Vitamin (MULTIVITAMIN WITH MINERALS) TABS tablet Take 1 tablet by mouth daily.  . pantoprazole (PROTONIX) 20 MG tablet Take 1 tablet (20 mg total) by mouth daily before breakfast.  . prednisoLONE 5 MG TABS tablet Take 8 tablets (40 mg total) by mouth daily.  Marland Kitchen spironolactone (ALDACTONE) 100 MG tablet Take 2.5 tablets (250 mg total) by mouth daily.  Marland Kitchen thiamine (VITAMIN B-1) 100 MG tablet Take 1 tablet (100 mg total) by mouth daily.   Past Medical History:  Diagnosis Date  . 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 There is a question of autoimmune component with weakly positive anti-smooth muscle antibody and an ANA Child C question component of hepatitis taking that worse. No varices, complicated by hepatic hydrothorax and ascites. No encephalopathy. Prednisolone started early August 2018  . Alcoholism (HCC)   . Allergy   . Anxiety   . Blood transfusion without reported diagnosis   . 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 Symptomatic anemia 01/19/2017   Past Surgical History:  Procedure Laterality Date  . COLONOSCOPY  01/2017  . ESOPHAGOGASTRODUODENOSCOPY  01/2017  . FERTILITY SURGERY  2003   in vitro fertilization  . IR THORACENTESIS ASP PLEURAL SPACE W/IMG GUIDE  02/21/2017  . WISDOM TOOTH EXTRACTION     age 72   Social History   Social History  .  Marital status: Married       . Number of children: 0   Occupational History  . [employer]   Social History Main Topics  . Smoking status: Former Smoker    Packs/day: 0.50    Years: 28.00    Types: Cigarettes    Quit date: 03/03/2017  . Smokeless tobacco: Never Used  . Alcohol use No     Comment: quit Spring/Summer 2018  . Drug use: No  . Sexual activity: Yes    Social History Narrative   Married   Employed at [employer]   family history includes Brain cancer in her  mother.   Review of Systems As above  Objective:   Physical Exam BP (!) 80/50   Pulse 84   Ht 5' 8.5" (1.74 m)   Wt 117 lb 3.2 oz (53.2 kg)   BMI 17.56 kg/m  Eyes icteric Skin mild jaundice numerous spider angiomata Lungs dec BS and dullness RLL base Cor s1s2 abd mild ascites Ext no edema Alert and oriented x 3 no gross signs encephalopathy  Lab Results  Component Value Date   WBC 29.9 Repeated and verified X2. (HH) 04/19/2017   HGB 9.7 (L) 04/19/2017   HCT 28.2 (L) 04/19/2017   MCV 103.0 (H) 04/19/2017   PLT 180.0 04/19/2017   Lab Results  Component Value Date   CREATININE 0.83 04/19/2017   BUN 23 04/19/2017   NA 124 (L) 04/19/2017   K 4.5 04/19/2017   CL 92 (L) 04/19/2017   CO2 20 04/19/2017   Lab Results  Component Value Date   ALT 118 (H) 04/19/2017   AST 76 (H) 04/19/2017   ALKPHOS 128 (H) 04/19/2017   BILITOT 11.3 (H) 04/19/2017   Lab Results  Component Value Date   INR 1.7 (H) 04/19/2017   INR 1.8 (H) 04/04/2017   INR 1.7 (H) 03/28/2017

## 2017-04-20 NOTE — Telephone Encounter (Signed)
Patient seen today in the office.

## 2017-04-20 NOTE — Assessment & Plan Note (Addendum)
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 Taper prednisolone over 16 days Refer to [facility] Arrange f/u me after I know when she will be seen there. She should be considered for transplant evaluation but needs to enroll in alcohol counseling

## 2017-04-20 NOTE — Assessment & Plan Note (Addendum)
Better, as good as she has been. Reduce spironolactone to 200 mg to see if Na will get better

## 2017-04-21 ENCOUNTER — Encounter: Payer: Self-pay | Admitting: Internal Medicine

## 2017-04-21 NOTE — Assessment & Plan Note (Addendum)
encouragred liberal low Na diet w/ supplements

## 2017-04-21 NOTE — Assessment & Plan Note (Signed)
Small lesion - malignancy possible Have elected to hold off on further imaging now - I think repeat MR later this year may make most sense - suspect she will be able to hold breath okthis time as resp status much better

## 2017-04-21 NOTE — Assessment & Plan Note (Signed)
Tiny lesion will see if it is changing with MR later this year

## 2017-04-22 ENCOUNTER — Other Ambulatory Visit: Payer: Self-pay | Admitting: Internal Medicine

## 2017-04-28 ENCOUNTER — Telehealth: Payer: Self-pay | Admitting: Internal Medicine

## 2017-04-28 DIAGNOSIS — K7030 Alcoholic cirrhosis of liver without ascites: Secondary | ICD-10-CM

## 2017-04-28 NOTE — Telephone Encounter (Signed)
She has not heard from liver clinic yet.  Follow up arranged with Dr. Leone Payor for 11/21

## 2017-04-28 NOTE — Telephone Encounter (Signed)
Yes  We can Rx PT - generalized weakness and decondition  Please evaluate and treat   She needs a Nov appt me  Does she have appt w/ [facility]?

## 2017-04-28 NOTE — Telephone Encounter (Signed)
We have gotten a fax from [facility] as of 2:13 pm, 10/26/16 that a referral for [facility] has been received and that patient will be contacted with appointment information. Our office is to be notified with date and time once appointment is made.

## 2017-04-28 NOTE — Telephone Encounter (Signed)
Patient will check with her insurance and find out what location she can go to for physical therapy.  She will call me back once she speaks with them

## 2017-04-28 NOTE — Telephone Encounter (Signed)
Have her get a CMET, CBC and INR on 10/1 please  Dx alcoholic cirrhosis and ascites

## 2017-05-01 ENCOUNTER — Telehealth: Payer: Self-pay | Admitting: Internal Medicine

## 2017-05-01 NOTE — Telephone Encounter (Signed)
Patient notified of the recommendations.  

## 2017-05-01 NOTE — Telephone Encounter (Signed)
Needs to be seen in the office to be cleared.  Needs labs, BP checked, etc.

## 2017-05-01 NOTE — Telephone Encounter (Signed)
Patient fell at work on Friday.  She is asking for a return to work release from the fall.  She is advised that Dr. Leone Payor is out of the office all week and that she will need to be seen by one of the providers to release her to work.  She would like me to ask Doug Sou, PA as she has seen her in the past.  She also asked that I print out the last few AVS from her OV.  I placed those at the front desk.  She is asking about her over all prognosis to provide to her employer.  She is advised that this is something she should discuss with Dr. Leone Payor or the [facility] when they see her [facility]) and not provide any information on that at this time. Doug Sou, PA are you willing to release her to return to work without seeing her?

## 2017-05-01 NOTE — Telephone Encounter (Signed)
appt made for tomorrow at 10:30 with Doug Sou, PA I left a detailed message

## 2017-05-02 ENCOUNTER — Encounter (HOSPITAL_COMMUNITY): Payer: Self-pay | Admitting: *Deleted

## 2017-05-02 ENCOUNTER — Observation Stay (HOSPITAL_COMMUNITY): Payer: 59

## 2017-05-02 ENCOUNTER — Emergency Department (HOSPITAL_COMMUNITY): Payer: 59

## 2017-05-02 ENCOUNTER — Inpatient Hospital Stay (HOSPITAL_COMMUNITY)
Admission: EM | Admit: 2017-05-02 | Discharge: 2017-06-08 | DRG: 871 | Disposition: E | Payer: 59 | Source: Ambulatory Visit | Attending: Pulmonary Disease | Admitting: Pulmonary Disease

## 2017-05-02 ENCOUNTER — Ambulatory Visit (INDEPENDENT_AMBULATORY_CARE_PROVIDER_SITE_OTHER): Payer: 59 | Admitting: Gastroenterology

## 2017-05-02 ENCOUNTER — Encounter (HOSPITAL_COMMUNITY): Payer: Self-pay | Admitting: General Practice

## 2017-05-02 ENCOUNTER — Encounter: Payer: Self-pay | Admitting: Gastroenterology

## 2017-05-02 VITALS — BP 90/56 | HR 140 | Temp 100.4°F | Ht 67.0 in | Wt 114.1 lb

## 2017-05-02 DIAGNOSIS — L8990 Pressure ulcer of unspecified site, unspecified stage: Secondary | ICD-10-CM | POA: Insufficient documentation

## 2017-05-02 DIAGNOSIS — J90 Pleural effusion, not elsewhere classified: Secondary | ICD-10-CM

## 2017-05-02 DIAGNOSIS — Y95 Nosocomial condition: Secondary | ICD-10-CM | POA: Diagnosis present

## 2017-05-02 DIAGNOSIS — F17210 Nicotine dependence, cigarettes, uncomplicated: Secondary | ICD-10-CM | POA: Diagnosis present

## 2017-05-02 DIAGNOSIS — R188 Other ascites: Secondary | ICD-10-CM

## 2017-05-02 DIAGNOSIS — J918 Pleural effusion in other conditions classified elsewhere: Secondary | ICD-10-CM | POA: Diagnosis present

## 2017-05-02 DIAGNOSIS — J939 Pneumothorax, unspecified: Secondary | ICD-10-CM

## 2017-05-02 DIAGNOSIS — K7031 Alcoholic cirrhosis of liver with ascites: Secondary | ICD-10-CM

## 2017-05-02 DIAGNOSIS — A419 Sepsis, unspecified organism: Secondary | ICD-10-CM | POA: Diagnosis not present

## 2017-05-02 DIAGNOSIS — Z01818 Encounter for other preprocedural examination: Secondary | ICD-10-CM

## 2017-05-02 DIAGNOSIS — Z4659 Encounter for fitting and adjustment of other gastrointestinal appliance and device: Secondary | ICD-10-CM

## 2017-05-02 DIAGNOSIS — R0902 Hypoxemia: Secondary | ICD-10-CM

## 2017-05-02 DIAGNOSIS — R6521 Severe sepsis with septic shock: Secondary | ICD-10-CM | POA: Diagnosis present

## 2017-05-02 DIAGNOSIS — R4182 Altered mental status, unspecified: Secondary | ICD-10-CM

## 2017-05-02 DIAGNOSIS — K210 Gastro-esophageal reflux disease with esophagitis: Secondary | ICD-10-CM | POA: Diagnosis present

## 2017-05-02 DIAGNOSIS — E872 Acidosis: Secondary | ICD-10-CM | POA: Diagnosis not present

## 2017-05-02 DIAGNOSIS — K5730 Diverticulosis of large intestine without perforation or abscess without bleeding: Secondary | ICD-10-CM | POA: Diagnosis present

## 2017-05-02 DIAGNOSIS — J189 Pneumonia, unspecified organism: Secondary | ICD-10-CM | POA: Diagnosis not present

## 2017-05-02 DIAGNOSIS — Z0189 Encounter for other specified special examinations: Secondary | ICD-10-CM

## 2017-05-02 DIAGNOSIS — D539 Nutritional anemia, unspecified: Secondary | ICD-10-CM | POA: Diagnosis present

## 2017-05-02 DIAGNOSIS — E43 Unspecified severe protein-calorie malnutrition: Secondary | ICD-10-CM | POA: Diagnosis present

## 2017-05-02 DIAGNOSIS — D638 Anemia in other chronic diseases classified elsewhere: Secondary | ICD-10-CM | POA: Diagnosis present

## 2017-05-02 DIAGNOSIS — E46 Unspecified protein-calorie malnutrition: Secondary | ICD-10-CM | POA: Diagnosis present

## 2017-05-02 DIAGNOSIS — Z87891 Personal history of nicotine dependence: Secondary | ICD-10-CM

## 2017-05-02 DIAGNOSIS — N179 Acute kidney failure, unspecified: Secondary | ICD-10-CM

## 2017-05-02 DIAGNOSIS — R0602 Shortness of breath: Secondary | ICD-10-CM

## 2017-05-02 DIAGNOSIS — R0600 Dyspnea, unspecified: Secondary | ICD-10-CM

## 2017-05-02 DIAGNOSIS — I509 Heart failure, unspecified: Secondary | ICD-10-CM | POA: Diagnosis present

## 2017-05-02 DIAGNOSIS — D696 Thrombocytopenia, unspecified: Secondary | ICD-10-CM | POA: Diagnosis present

## 2017-05-02 DIAGNOSIS — R0603 Acute respiratory distress: Secondary | ICD-10-CM | POA: Diagnosis not present

## 2017-05-02 DIAGNOSIS — J9601 Acute respiratory failure with hypoxia: Secondary | ICD-10-CM

## 2017-05-02 DIAGNOSIS — K769 Liver disease, unspecified: Secondary | ICD-10-CM

## 2017-05-02 DIAGNOSIS — E871 Hypo-osmolality and hyponatremia: Secondary | ICD-10-CM | POA: Diagnosis present

## 2017-05-02 DIAGNOSIS — J9811 Atelectasis: Secondary | ICD-10-CM | POA: Diagnosis present

## 2017-05-02 DIAGNOSIS — Z515 Encounter for palliative care: Secondary | ICD-10-CM | POA: Diagnosis present

## 2017-05-02 DIAGNOSIS — Z79899 Other long term (current) drug therapy: Secondary | ICD-10-CM

## 2017-05-02 DIAGNOSIS — K7290 Hepatic failure, unspecified without coma: Secondary | ICD-10-CM | POA: Diagnosis present

## 2017-05-02 DIAGNOSIS — Z66 Do not resuscitate: Secondary | ICD-10-CM | POA: Diagnosis present

## 2017-05-02 DIAGNOSIS — D649 Anemia, unspecified: Secondary | ICD-10-CM | POA: Diagnosis present

## 2017-05-02 DIAGNOSIS — J95811 Postprocedural pneumothorax: Secondary | ICD-10-CM | POA: Diagnosis not present

## 2017-05-02 DIAGNOSIS — D689 Coagulation defect, unspecified: Secondary | ICD-10-CM | POA: Diagnosis present

## 2017-05-02 DIAGNOSIS — R509 Fever, unspecified: Secondary | ICD-10-CM | POA: Diagnosis not present

## 2017-05-02 DIAGNOSIS — W19XXXA Unspecified fall, initial encounter: Secondary | ICD-10-CM | POA: Diagnosis present

## 2017-05-02 DIAGNOSIS — K7011 Alcoholic hepatitis with ascites: Secondary | ICD-10-CM | POA: Diagnosis present

## 2017-05-02 DIAGNOSIS — Z9889 Other specified postprocedural states: Secondary | ICD-10-CM

## 2017-05-02 DIAGNOSIS — R17 Unspecified jaundice: Secondary | ICD-10-CM

## 2017-05-02 DIAGNOSIS — J948 Other specified pleural conditions: Secondary | ICD-10-CM | POA: Diagnosis present

## 2017-05-02 DIAGNOSIS — R000 Tachycardia, unspecified: Secondary | ICD-10-CM | POA: Diagnosis present

## 2017-05-02 DIAGNOSIS — Z9689 Presence of other specified functional implants: Secondary | ICD-10-CM

## 2017-05-02 DIAGNOSIS — R011 Cardiac murmur, unspecified: Secondary | ICD-10-CM | POA: Diagnosis present

## 2017-05-02 DIAGNOSIS — F1020 Alcohol dependence, uncomplicated: Secondary | ICD-10-CM | POA: Diagnosis present

## 2017-05-02 DIAGNOSIS — E875 Hyperkalemia: Secondary | ICD-10-CM

## 2017-05-02 DIAGNOSIS — J9382 Other air leak: Secondary | ICD-10-CM | POA: Diagnosis not present

## 2017-05-02 DIAGNOSIS — Z6820 Body mass index (BMI) 20.0-20.9, adult: Secondary | ICD-10-CM

## 2017-05-02 HISTORY — DX: Personal history of other medical treatment: Z92.89

## 2017-05-02 HISTORY — DX: Major depressive disorder, single episode, unspecified: F32.9

## 2017-05-02 HISTORY — DX: Cardiac murmur, unspecified: R01.1

## 2017-05-02 HISTORY — DX: Depression, unspecified: F32.A

## 2017-05-02 LAB — ETHANOL: Alcohol, Ethyl (B): <10 mg/dL — ABNORMAL HIGH (ref <5)

## 2017-05-02 LAB — CBC WITH DIFFERENTIAL/PLATELET
BASOS ABS: 0 10*3/uL (ref 0.0–0.1)
BASOS PCT: 0 %
EOS ABS: 0 10*3/uL (ref 0.0–0.7)
Eosinophils Relative: 0 %
HCT: 26.5 % — ABNORMAL LOW (ref 36.0–46.0)
Hemoglobin: 9.1 g/dL — ABNORMAL LOW (ref 12.0–15.0)
LYMPHS ABS: 1.9 10*3/uL (ref 0.7–4.0)
Lymphocytes Relative: 9 %
MCH: 34.5 pg — AB (ref 26.0–34.0)
MCHC: 34.3 g/dL (ref 30.0–36.0)
MCV: 100.4 fL — ABNORMAL HIGH (ref 78.0–100.0)
MONO ABS: 1.2 10*3/uL — AB (ref 0.1–1.0)
Monocytes Relative: 6 %
NEUTROS ABS: 17.6 10*3/uL — AB (ref 1.7–7.7)
Neutrophils Relative %: 85 %
PLATELETS: 159 10*3/uL (ref 150–400)
RBC: 2.64 MIL/uL — ABNORMAL LOW (ref 3.87–5.11)
RDW: 15.8 % — AB (ref 11.5–15.5)
WBC: 20.7 10*3/uL — ABNORMAL HIGH (ref 4.0–10.5)

## 2017-05-02 LAB — URINALYSIS, ROUTINE W REFLEX MICROSCOPIC
Bilirubin Urine: NEGATIVE
GLUCOSE, UA: NEGATIVE mg/dL
Ketones, ur: NEGATIVE mg/dL
NITRITE: NEGATIVE
PH: 6 (ref 5.0–8.0)
Protein, ur: NEGATIVE mg/dL
SPECIFIC GRAVITY, URINE: 1.015 (ref 1.005–1.030)

## 2017-05-02 LAB — COMPREHENSIVE METABOLIC PANEL
ALBUMIN: 2.2 g/dL — AB (ref 3.5–5.0)
ALK PHOS: 96 U/L (ref 38–126)
ALT: 95 U/L — AB (ref 14–54)
ANION GAP: 11 (ref 5–15)
AST: 80 U/L — ABNORMAL HIGH (ref 15–41)
BILIRUBIN TOTAL: 7.4 mg/dL — AB (ref 0.3–1.2)
BUN: 18 mg/dL (ref 6–20)
CALCIUM: 8.7 mg/dL — AB (ref 8.9–10.3)
CO2: 21 mmol/L — AB (ref 22–32)
CREATININE: 0.87 mg/dL (ref 0.44–1.00)
Chloride: 90 mmol/L — ABNORMAL LOW (ref 101–111)
GFR calc Af Amer: >60 mL/min (ref >60)
GFR calc non Af Amer: >60 mL/min (ref >60)
GLUCOSE: 78 mg/dL (ref 65–99)
Potassium: 4.7 mmol/L (ref 3.5–5.1)
SODIUM: 122 mmol/L — AB (ref 135–145)
TOTAL PROTEIN: 6.7 g/dL (ref 6.5–8.1)

## 2017-05-02 LAB — RAPID URINE DRUG SCREEN, HOSP PERFORMED
Amphetamines: NOT DETECTED
BENZODIAZEPINES: NOT DETECTED
Barbiturates: NOT DETECTED
COCAINE: NOT DETECTED
OPIATES: NOT DETECTED
TETRAHYDROCANNABINOL: NOT DETECTED

## 2017-05-02 LAB — LACTIC ACID, PLASMA
LACTIC ACID, VENOUS: 2.4 mmol/L — AB (ref 0.5–1.9)
Lactic Acid, Venous: 1.5 mmol/L (ref 0.5–1.9)

## 2017-05-02 LAB — I-STAT CG4 LACTIC ACID, ED: Lactic Acid, Venous: 2.15 mmol/L (ref 0.5–1.9)

## 2017-05-02 LAB — AMMONIA: AMMONIA: 31 umol/L (ref 9–35)

## 2017-05-02 LAB — PROTIME-INR
INR: 1.38
Prothrombin Time: 16.9 seconds — ABNORMAL HIGH (ref 11.4–15.2)

## 2017-05-02 MED ORDER — SODIUM CHLORIDE 0.9 % IV BOLUS (SEPSIS)
1000.0000 mL | Freq: Once | INTRAVENOUS | Status: AC
  Administered 2017-05-02: 1000 mL via INTRAVENOUS

## 2017-05-02 MED ORDER — DEXTROSE 5 % IV SOLN
1.0000 g | Freq: Three times a day (TID) | INTRAVENOUS | Status: DC
  Administered 2017-05-02 – 2017-05-08 (×18): 1 g via INTRAVENOUS
  Filled 2017-05-02 (×22): qty 1

## 2017-05-02 MED ORDER — SODIUM CHLORIDE 0.9% FLUSH
3.0000 mL | Freq: Two times a day (BID) | INTRAVENOUS | Status: DC
  Administered 2017-05-02 – 2017-05-18 (×25): 3 mL via INTRAVENOUS

## 2017-05-02 MED ORDER — IBUPROFEN 100 MG/5ML PO SUSP
ORAL | Status: AC
  Filled 2017-05-02: qty 5

## 2017-05-02 MED ORDER — FERROUS SULFATE 325 (65 FE) MG PO TABS
325.0000 mg | ORAL_TABLET | Freq: Two times a day (BID) | ORAL | Status: DC
  Administered 2017-05-03 – 2017-05-15 (×22): 325 mg via ORAL
  Filled 2017-05-02 (×23): qty 1

## 2017-05-02 MED ORDER — ONDANSETRON HCL 4 MG PO TABS: 4.0000 mg | ORAL_TABLET | Freq: Four times a day (QID) | ORAL | Status: DC | PRN

## 2017-05-02 MED ORDER — ENSURE ENLIVE PO LIQD
237.0000 mL | Freq: Two times a day (BID) | ORAL | Status: DC
  Administered 2017-05-03 – 2017-05-09 (×9): 237 mL via ORAL

## 2017-05-02 MED ORDER — IBUPROFEN 100 MG/5ML PO SUSP
ORAL | Status: AC
  Filled 2017-05-02: qty 30

## 2017-05-02 MED ORDER — PANTOPRAZOLE SODIUM 20 MG PO TBEC
20.0000 mg | DELAYED_RELEASE_TABLET | Freq: Every day | ORAL | Status: DC
  Administered 2017-05-03 – 2017-05-15 (×11): 20 mg via ORAL
  Filled 2017-05-02 (×9): qty 1

## 2017-05-02 MED ORDER — ENOXAPARIN SODIUM 40 MG/0.4ML ~~LOC~~ SOLN: 40.0000 mg | SUBCUTANEOUS | Status: DC

## 2017-05-02 MED ORDER — VITAMIN B-1 100 MG PO TABS
100.0000 mg | ORAL_TABLET | Freq: Every day | ORAL | Status: DC
  Administered 2017-05-02 – 2017-05-06 (×5): 100 mg via ORAL
  Filled 2017-05-02 (×5): qty 1

## 2017-05-02 MED ORDER — SODIUM CHLORIDE 0.9 % IV BOLUS (SEPSIS): 1000.0000 mL | Freq: Once | INTRAVENOUS | Status: DC

## 2017-05-02 MED ORDER — FUROSEMIDE 10 MG/ML IJ SOLN
40.0000 mg | Freq: Once | INTRAMUSCULAR | Status: AC
  Administered 2017-05-02: 40 mg via INTRAVENOUS
  Filled 2017-05-02: qty 4

## 2017-05-02 MED ORDER — VANCOMYCIN HCL IN DEXTROSE 1-5 GM/200ML-% IV SOLN
1000.0000 mg | Freq: Once | INTRAVENOUS | Status: AC
  Administered 2017-05-02: 1000 mg via INTRAVENOUS
  Filled 2017-05-02: qty 200

## 2017-05-02 MED ORDER — ESCITALOPRAM OXALATE 20 MG PO TABS
20.0000 mg | ORAL_TABLET | Freq: Every day | ORAL | Status: DC
  Administered 2017-05-02 – 2017-05-15 (×13): 20 mg via ORAL
  Filled 2017-05-02: qty 1
  Filled 2017-05-02 (×9): qty 2
  Filled 2017-05-02: qty 1
  Filled 2017-05-02 (×3): qty 2

## 2017-05-02 MED ORDER — SODIUM CHLORIDE 0.9 % IV SOLN
INTRAVENOUS | Status: DC
  Administered 2017-05-02: 17:00:00 via INTRAVENOUS

## 2017-05-02 MED ORDER — VANCOMYCIN HCL IN DEXTROSE 750-5 MG/150ML-% IV SOLN
750.0000 mg | Freq: Two times a day (BID) | INTRAVENOUS | Status: DC
  Administered 2017-05-02 – 2017-05-05 (×7): 750 mg via INTRAVENOUS
  Filled 2017-05-02 (×8): qty 150

## 2017-05-02 MED ORDER — IBUPROFEN 100 MG/5ML PO SUSP
ORAL | Status: AC
  Administered 2017-05-02: 600 mg
  Filled 2017-05-02: qty 5

## 2017-05-02 MED ORDER — ONDANSETRON HCL 4 MG/2ML IJ SOLN
4.0000 mg | Freq: Four times a day (QID) | INTRAMUSCULAR | Status: DC | PRN
Start: 2017-05-02 — End: 2017-05-16
  Filled 2017-05-02: qty 2

## 2017-05-02 MED ORDER — IBUPROFEN 200 MG PO TABS
600.0000 mg | ORAL_TABLET | Freq: Once | ORAL | Status: DC
  Filled 2017-05-02: qty 3

## 2017-05-02 MED ORDER — PIPERACILLIN-TAZOBACTAM 3.375 G IVPB 30 MIN
3.3750 g | Freq: Once | INTRAVENOUS | Status: AC
  Administered 2017-05-02: 3.375 g via INTRAVENOUS
  Filled 2017-05-02: qty 50

## 2017-05-02 NOTE — ED Notes (Signed)
Ordered reg tray 

## 2017-05-02 NOTE — Progress Notes (Signed)
Patient ID: Rhonda Haley, female   DOB: 1966-03-25, 51 y.o.   MRN: [id number]  Pt admitted for pneumonia   Rn reports pt has had increased heart rate to 130's increased respiration to 40's.  Pt's 02 sats decreaed to 80's.   Pt is not on home 02.  Pt was on 2 liters,  Rn has increased to 6 liters.    Pt complains of shortness of breath,  Pt reports she is not on home 02.  Pt has had a pleural effusion.  Pt has been told it dose not need to be drained.  Pt reports she is on home lasix and has not had today.    PE  Thin, frail appearing 51y/o, heart rate 130's resp rate 40's  Lungs limited breath sounds right side,  Heart tachycardic,  Abdomen distended slightly   Portable chest xray obtained. It shows increasing effusion infiltrates and opacity which may be pulmonary edema.   Pt given ibuprofen 600 mg for fever of 102.  Iv fluids held.  Pt given lasix  IV.   I spoke to critical care.  Dr. Sung Amabile.  Critical will consult for possible thoracentesis.  Pt transferred to step down unit.    I reevaluated pt at 11:30p.  Pt has improved. She is breathing easier.  She reports urine output.

## 2017-05-02 NOTE — ED Provider Notes (Signed)
[facility]-EMERGENCY DEPT Provider Note   CSN: [id number] Arrival date & time: 04/21/2017  1230     History   Chief Complaint Chief Complaint  Patient presents with  . Fever  . Shortness of Breath    HPI Rhonda Haley is a 51 y.o. female.  HPI Patient is a 10 year oldfemale history of alcoholic cirrhosis who is been sober since July until this weekend which point shestates that she did drink wine.  She was seen in the GI office today for clearance as she had a fall on Friday at work and was noted to be hypotensive and tachycardic as well as a mild temperature.  Patient states she's been feeling weak and has had new exertional shortness of breath over the past several days without significant cough.  Denies abdominal pain.  No nausea vomiting or diarrhea.  She states she's been compliant with her medications.  No back pain or flank pain.  No chest pain.  Symptoms are mild to moderate in severity.  She's noted to be jaundiced today.  She states she is more jaundiced than baseline.   Past Medical History:  Diagnosis Date  . 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 There is a question of autoimmune component with weakly positive anti-smooth muscle antibody and an ANA Child C question component of hepatitis taking that worse. No varices, complicated by hepatic hydrothorax and ascites. No encephalopathy. Prednisolone started early August 2018  . Alcoholism (HCC)   . Allergy   . Anxiety   . Blood transfusion without reported diagnosis   . 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 Symptomatic anemia 01/19/2017    Patient Active Problem List   Diagnosis Date Noted  . Fever, unspecified 05/01/2017  . Abnormal MRI, kidney 03/30/2017  . Alcoholism (HCC)   . Alcoholic hepatitis with ascites 03/21/2017  . Coagulopathy (HCC) 03/21/2017  . Hyponatremia 03/21/2017  . Thrombocytopenia (HCC) 03/21/2017  . Hydrothorax- hepatic  03/18/2017  . Ascites due to alcoholic cirrhosis (HCC)   . Jaundice 03/09/2017  . Cholelithiasis 02/24/2017  . Abnormal MRI, liver 02/24/2017  . Protein-calorie malnutrition, severe 02/21/2017  . Normocytic anemia, not due to blood loss 01/19/2017  . Alcoholic cirrhosis of liver without ascites (HCC) 01/19/2017  . Alcoholic cirrhosis of liver with ascites (HCC) 01/19/2017    Past Surgical History:  Procedure Laterality Date  . COLONOSCOPY  01/2017  . ESOPHAGOGASTRODUODENOSCOPY  01/2017  . FERTILITY SURGERY  2003   in vitro fertilization  . IR THORACENTESIS ASP PLEURAL SPACE W/IMG GUIDE  02/21/2017  . WISDOM TOOTH EXTRACTION     age 81    OB History    No data available       Home Medications    Prior to Admission medications   Medication Sig Start Date End Date Taking? Authorizing Provider  escitalopram (LEXAPRO) 20 MG tablet Take 20 mg by mouth daily. 01/18/17  Yes [provider]  feeding supplement, ENSURE ENLIVE, (ENSURE ENLIVE) LIQD Take 237 mLs by mouth 2 (two) times daily between meals. 01/21/17  Yes Ghimire, Werner Lean, MD  furosemide (LASIX) 40 MG tablet Take 1.5 tablets (60 mg total) by mouth daily. 04/05/17  Yes Iva Boop, MD  Melatonin 10 MG TABS Take 10 mg by mouth at bedtime.   Yes [provider]  Multiple Vitamin (MULTIVITAMIN WITH MINERALS) TABS tablet Take 1 tablet by mouth daily. 03/21/17  Yes Calvert Cantor, MD  pantoprazole (PROTONIX) 20 MG  tablet Take 1 tablet (20 mg total) by mouth daily before breakfast. 02/01/17  Yes Iva Boop, MD  potassium chloride SA (K-DUR,KLOR-CON) 20 MEQ tablet Take 1 tablet (20 mEq total) by mouth daily. 02/24/17 05/01/2017 Yes Beola Cord, MD  prednisoLONE (MILLIPRED) 5 MG TABS tablet PER LAST NOTE FROM 04/25/17 YOU SHOULD BE DOING ALREADY,Taper your prednisone as follows:  starting tomorrow for 4 Days then go to  for 4 days, then  for 4 days, then  for 4 days then STOP 04/24/17  Yes Iva Boop, MD  ferrous sulfate 325 (65 FE) MG tablet Take 1 tablet (325 mg total) by mouth 2 (two) times daily with a meal. 03/17/17   Zehr, Princella Pellegrini, PA-C  spironolactone (ALDACTONE) 100 MG tablet Take 2 tablets (200 mg total) by mouth daily. 04/20/17   Iva Boop, MD  thiamine (VITAMIN B-1) 100 MG tablet Take 1 tablet (100 mg total) by mouth daily. 04/20/17   Iva Boop, MD    Family History Family History  Problem Relation Age of Onset  . Brain cancer Mother     Social History Social History  Substance Use Topics  . Smoking status: Former Smoker    Packs/day: 0.50    Years: 28.00    Types: Cigarettes    Quit date: 03/03/2017  . Smokeless tobacco: Never Used  . Alcohol use No     Comment: quit Spring/Summer 2018- had some this weekend     Allergies   Patient has no known allergies.   Review of Systems Review of Systems  All other systems reviewed and are negative.    Physical Exam Updated Vital Signs BP (!) 109/59   Pulse (!) 106   Temp (!) 100.5 F (38.1 C) (Rectal)   Resp (!) 37   Ht  (1.727 m)   Wt 51.7 kg (114 lb)   SpO2 94%   BMI 17.33 kg/m   Physical Exam  Constitutional: She is oriented to person, place, and time. She appears well-developed and well-nourished. No distress.  HENT:  Head: Normocephalic and atraumatic.  Eyes: EOM are normal. Scleral icterus is present.  Neck: Normal range of motion.  Cardiovascular: Normal rate, regular rhythm and normal heart sounds.   Pulmonary/Chest: Effort normal and breath sounds normal.  Abdominal: Soft. She exhibits no distension. There is no tenderness.  Musculoskeletal: Normal range of motion.  Neurological: She is alert and oriented to person, place, and time.  Skin: Skin is warm and dry.  jaundice  Psychiatric: She has a normal mood and affect. Judgment normal.  Nursing note and vitals reviewed.    ED Treatments / Results  Labs (all labs ordered are listed, but only abnormal results are  displayed) Labs Reviewed  COMPREHENSIVE METABOLIC PANEL - Abnormal; Notable for the following:       Result Value   Sodium 122 (*)    Chloride 90 (*)    CO2 21 (*)    Calcium 8.7 (*)    Albumin 2.2 (*)    AST 80 (*)    ALT 95 (*)    Total Bilirubin 7.4 (*)    All other components within normal limits  CBC WITH DIFFERENTIAL/PLATELET - Abnormal; Notable for the following:    WBC 20.7 (*)    RBC 2.64 (*)    Hemoglobin 9.1 (*)    HCT 26.5 (*)    MCV 100.4 (*)    MCH 34.5 (*)    RDW 15.8 (*)  Neutro Abs 17.6 (*)    Monocytes Absolute 1.2 (*)    All other components within normal limits  URINALYSIS, ROUTINE W REFLEX MICROSCOPIC - Abnormal; Notable for the following:    Color, Urine AMBER (*)    APPearance HAZY (*)    Hgb urine dipstick SMALL (*)    Leukocytes, UA TRACE (*)    Bacteria, UA RARE (*)    Squamous Epithelial / LPF 0-5 (*)    All other components within normal limits  PROTIME-INR - Abnormal; Notable for the following:    Prothrombin Time 16.9 (*)    All other components within normal limits  I-STAT CG4 LACTIC ACID, ED - Abnormal; Notable for the following:    Lactic Acid, Venous 2.15 (*)    All other components within normal limits  CULTURE, BLOOD (ROUTINE X 2)  CULTURE, BLOOD (ROUTINE X 2)  URINE CULTURE  RESPIRATORY PANEL BY PCR  AMMONIA  RAPID URINE DRUG SCREEN, HOSP PERFORMED  ETHANOL  PROCALCITONIN  INFLUENZA PANEL BY PCR (TYPE A & B)  LACTIC ACID, PLASMA  LACTIC ACID, PLASMA    EKG  EKG Interpretation None       Radiology Dg Chest 2 View  Result Date: 04/26/2017 CLINICAL DATA:  Pt just left the MD office and is being treated for liver problems and today lab work abnormal. Pt states wbc and heart racing, sob. Pt states no fever but felt achy all over and had fever at MD office. EXAM: CHEST  2 VIEW COMPARISON:  03/28/2017 FINDINGS: Heart size is within normal limits. There is moderate right pleural effusion associated with significant right  basilar opacity, obscuring the hemidiaphragm. The right basilar opacity may indicate atelectasis and/or infiltrate. Right effusion is slightly less than prior study but remains significant. There is minimal left lower lobe subsegmental atelectasis of left lung is otherwise clear. No pulmonary edema. IMPRESSION: Significant right pleural effusion and right basilar opacity. Electronically Signed   By: Norva Pavlov M.D.   On: 05/01/2017 14:00    Procedures Procedures (including critical care time)  Medications Ordered in ED Medications  vancomycin (VANCOCIN) IVPB 1000 mg/200 mL premix (1,000 mg Intravenous New Bag/Given 05/07/2017 1636)  piperacillin-tazobactam (ZOSYN) IVPB 3.375 g (3.375 g Intravenous New Bag/Given 04/12/2017 1629)  0.9 %  sodium chloride infusion ( Intravenous New Bag/Given 05/06/2017 1636)  sodium chloride 0.9 % bolus 1,000 mL (1,000 mLs Intravenous New Bag/Given 05/05/2017 1536)     Initial Impression / Assessment and Plan / ED Course  I have reviewed the triage vital signs and the nursing notes.  Pertinent labs & imaging results that were available during my care of the patient were reviewed by me and considered in my medical decision making (see chart for details).     ypoxia down to 88%.  Increasing effusion and infiltrate at the right base  Patient be covered for healthcare associated pneumonia.  Cultures obtained.  Alcohol level pending. No hypotension in the ER.patiently admitted the hospital for ongoing workup.  She has no abdominal tenderness or nausea or vomiting this time.  I do not think this is a presentation of SBP.  I do not think she needs urgent paracentesis  Final Clinical Impressions(s) / ED Diagnoses   Final diagnoses:  Sepsis (HCC)  HCAP (healthcare-associated pneumonia)  Hypoxia  Altered mental status, unspecified altered mental status type    New Prescriptions New Prescriptions   No medications on file     Azalia Bilis, MD 04/12/2017 [phone]

## 2017-05-02 NOTE — Progress Notes (Addendum)
Called by SWOT RNs to assess patient for increased HR and RR.  Upon arrival, patient was alert, RR 30-35, mild increased work of breathing, not in acute respiratory distress, Lung sounds: LEFT: Clear in the upper, very diminished in the lower, RIGHT: diminished upper, and very diminished lower.  Patient + chronic RT hydrothorax.   + Jaundice, + Sclera yellow, skin - decreased capillary refill in fingers and toes. VS SBP 120s, HR 120-130, febrile temp > 102, elevated lactic 2.4, sats > 92% on 4L.    I ordered CXR and [facility] provider on call came to bedside.  I was called away to see another patient, when I arrived back, CXR was review, [facility] PA consulted CCM, will transfer patient to SDU and patient will need beside thoracentesis. CXR: + large pleural effusion, + Left infiltrates/consolidation, mild edema.   Patient will be transferred to [place] by primary RNs, I did call 2[facility] and updated them and instructed staff to call RR RN if needed for bedside procedure.   Will follow as needed.   Start Time 2145 End Time 2300

## 2017-05-02 NOTE — ED Notes (Signed)
Pt placed on 2lpm [place] d/t spo2 88% RA and rr 40

## 2017-05-02 NOTE — Progress Notes (Signed)
Alerted to pt's presence in ED. Sent from GI office to ED due to concern over clinical picture, weakness and chills, tachycardic, hypotensive, tremulous and unsteady, abnormal labs with WBCs 30K on 9/12, (23K on 8/21).  Concern for sepsis, SBP. Just got into ED room, physician has not yet seen.   Pt lucid and no confusion.  Tells me she slipped off wagon over weekend and drank a glass of wine.  Also has not made phone call to arrange for OV with [facility]) liver clinic, though has info with which to make contact.    Placed orders for PT/INR.  Orders already in place for U/A, CBC, CMET, serial lactic acid.   Will likely need ultrasound paracentesis with cell count and diff,  Albumin and empiric abx.    Will follow up pt in AM as I suspect she will end up admitted to hospital.  Jennye Moccasin PA-C.

## 2017-05-02 NOTE — Patient Instructions (Signed)
Please go to [facility].  

## 2017-05-02 NOTE — Progress Notes (Addendum)
04/25/2017 Rhonda Haley [id number] Feb 13, 1966   HISTORY OF PRESENT ILLNESS:  This is a 51 year old female who is known to Dr. Leone Payor for alcohol cirrhosis, acute ETOH hepatitis with associated ascites, hepatic hydrothorax, and mild coagulopathy.  Is currently on low-dose prednisolone for treatment of acute alcohol hepatitis (looks like this was initiated around 03/21/2017). Was just seen by him in the office on September 13. Apparently since that time she fell while at work and now needs to be cleared from our standpoint in order to return to work.  She says that overall she just feels weak, no specific complaints other than being unsteady and shaky.  No abdominal pain, cough, pain with urination.  Feels very cold and is dressed in layers today.  Is waiting for an appt at the liver clinic.  Had WBC count of 30K a couple of weeks ago.  No ETOH since end of July, ? Around the 21st.  Has not enrolled in ETOH program yet.   Past Medical History:  Diagnosis Date  . 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 There is a question of autoimmune component with weakly positive anti-smooth muscle antibody and an ANA Child C question component of hepatitis taking that worse. No varices, complicated by hepatic hydrothorax and ascites. No encephalopathy. Prednisolone started early August 2018  . Alcoholism (HCC)   . Allergy   . Anxiety   . Blood transfusion without reported diagnosis   . 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 Symptomatic anemia 01/19/2017   Past Surgical History:  Procedure Laterality Date  . COLONOSCOPY  01/2017  . ESOPHAGOGASTRODUODENOSCOPY  01/2017  . FERTILITY SURGERY  2003   in vitro fertilization  . IR THORACENTESIS ASP PLEURAL SPACE W/IMG GUIDE  02/21/2017  . WISDOM TOOTH EXTRACTION     age 62    reports that she quit smoking about 1 months ago. Her smoking use included Cigarettes. She has a 14.00  pack-year smoking history. She has never used smokeless tobacco. She reports that she does not drink alcohol or use drugs. family history includes Brain cancer in her mother. No Known Allergies    Outpatient Encounter Prescriptions as of 05/07/2017  Medication Sig  . escitalopram (LEXAPRO) 20 MG tablet Take 20 mg by mouth daily.  . feeding supplement, ENSURE ENLIVE, (ENSURE ENLIVE) LIQD Take 237 mLs by mouth 2 (two) times daily between meals.  . ferrous sulfate 325 (65 FE) MG tablet Take 1 tablet (325 mg total) by mouth 2 (two) times daily with a meal.  . furosemide (LASIX) 40 MG tablet Take 1.5 tablets (60 mg total) by mouth daily.  . Melatonin 10 MG TABS Take 10 mg by mouth at bedtime.  . Multiple Vitamin (MULTIVITAMIN WITH MINERALS) TABS tablet Take 1 tablet by mouth daily.  . pantoprazole (PROTONIX) 20 MG tablet Take 1 tablet (20 mg total) by mouth daily before breakfast.  . potassium chloride SA (K-DUR,KLOR-CON) 20 MEQ tablet Take 1 tablet (20 mEq total) by mouth daily.  . prednisoLONE (MILLIPRED) 5 MG TABS tablet PER LAST NOTE FROM 04/25/17 YOU SHOULD BE DOING ALREADY,Taper your prednisone as follows:  starting tomorrow for 4 Days then go to  for 4 days, then  for 4 days, then  for 4 days then STOP  . spironolactone (ALDACTONE) 100 MG tablet Take 2 tablets (200 mg total) by mouth daily.  Marland Kitchen thiamine (VITAMIN B-1) 100 MG tablet Take 1 tablet (100 mg  total) by mouth daily.  . [DISCONTINUED] prednisoLONE 5 MG TABS tablet Take 6 tablets (30 mg total) by mouth daily. And taper as directed   No facility-administered encounter medications on file as of 05/07/2017.      REVIEW OF SYSTEMS  : All other systems reviewed and negative except where noted in the History of Present Illness.   PHYSICAL EXAM: BP (!) 90/56 (BP Location: Left Arm, Patient Position: Sitting, Cuff Size: Normal)   Pulse (!) 140   Temp (!) 100.4 F (38 C)   Ht  (1.702 m) Comment: height measured without  shoes  Wt 114 lb 2 oz (51.8 kg)   BMI 17.87 kg/m  General: Chronically ill-appearing white female in no acute distress; jaudice, very unsteady and shaky. Head: Normocephalic and atraumatic Eyes:  Scleral icterus noted. Ears: Normal auditory acuity Lungs: Clear throughout to auscultation; no increased WOB. Heart: Tachycardic in the 140's Abdomen: Soft, non-distended.  BS present.  Non-tender. Musculoskeletal: Symmetrical with no gross deformities  Skin: No lesions on visible extremities Extremities: No edema  Neurological: Alert oriented x 4, grossly non-focal Psychological:  Alert and cooperative. Normal mood and affect  ASSESSMENT AND PLAN: *ETOH cirrhosis:  In the office today patient is very tachycardic, hypotensive, with low-grade fever. Recently had a white blood cell count of 30,000--is on prednisolone, but low dose.  She is extremely shaky and unsteady. She has had ascites and hepatic hydrothorax. I think that she needs to be evaluated for sepsis, possible infection in ascites fluid versus other sources. Should have chest x-ray, labs, urinalysis, likely blood cultures, diagnostic tap. I have asked her to go to the ER for evaluation where they will do all this testing as they see fit.  I anticipate that the patient will be admitted to the hospital. *Severe protein-calorie malnutrition  CC:  Lewis Moccasin, MD  Agree with Ms. Rise Mu management.  Iva Boop, MD, Clementeen Graham

## 2017-05-02 NOTE — ED Notes (Signed)
Attempted report x1. 

## 2017-05-02 NOTE — H&P (Signed)
History and Physical    Rhonda Haley ZOX:[id number] DOB: 1966/03/29 DOA: 04/30/2017   PCP: Lewis Moccasin, MD   Attending physician: Konrad Dolores  Patient coming from/Resides with: Private residence  Chief Complaint: Fever, chills, tachycardia and hypotension  HPI: Rhonda Haley is a 51 y.o. female with medical history significant for alcoholic cirrhosis with associated ascites, recurrent right hepatic hydrothorax, chronic hyponatremia, protein calorie malnutrition and anemia. Patient had been started on low-dose prednisone (03/21/17) for acute alcoholic hepatitis in the setting of weakly positive autoimmune markers. Over the past 2 weeks prednisone has been tapered to off with last dosage on 9/24. Patient has had asymptomatic leukocytosis dating back to 8/21 where her white count was documented at 23,300 with repeat CBC on 9/12 white count 29,900. This morning patient awakened with generalized malaise, weakness, chills and subjective fevers. She is not having any abdominal pain, nausea vomiting or diarrhea. She is not had any coughing or sick contacts. She followed up with the GI office she was noted to have a low-grade temperature of 100.4, heart rate was 140, BP was 90/56. Because of the symptoms she was sent to the ER for further evaluation. In the ER she continued with low-grade temperature and tachycardia as well as tachypnea with a low but normal blood pressure readings. Her white count was 20,700 with left shift as previous. Her lactic acid was 2.15. Her sodium was 122. Abdominal exam was benign. Chest x-ray revealed significant right pleural effusion and right basilar opacity. EDP is concerned over possible HCAP and requests evaluation for observation admission. She is not hypoxic at rest. She does report dyspnea on exertion which is chronic.  ED Course:  Vital Signs: BP (!) 109/59   Pulse (!) 106   Temp (!) 100.5 F (38.1 C) (Rectal)   Resp (!) 37   Ht  (1.727 m)   Wt 51.7 kg (114 lb)    SpO2 94%   BMI 17.33 kg/m  2 view CXR: As above Lab data: Sodium 122, potassium 4.7, chloride 90, CO2 21, glucose 78, BUN 18, creatinine 0.7, anion gap 11, AST 80, ALT 95, total bilirubin 7.4, albumin 2.2, ammonia 31, white count 20,700 with neutrophils 85% and absolute atrial 17.6%, hemoglobin 9.1, platelets 159,000, PT 16.9, INR 1.38, urinalysis abnormal with hazy appearance, amber color, small hemoglobin, trace leukocyte, specific gravity 1.015, rare bacteria but no WBCs. Urine drug screen negative, ETOH pending, blood cultures and urine culture obtained in the ER Medications and treatments: Normal saline bolus 1 L, vancomycin 1 g IV 1, Zosyn 3.375 g IV 1  Review of Systems:  In addition to the HPI above,  No Headache, changes with Vision or hearing, new weakness, tingling, numbness in any extremity, dizziness, dysarthria or word finding difficulty, gait disturbance or imbalance, tremors or seizure activity No problems swallowing food or Liquids, indigestion/reflux, choking or coughing while eating, abdominal pain with or after eating No Chest pain, Cough or Shortness of Breath, palpitations, orthopnea; no change in chronic DOE No Abdominal pain, N/V, melena,hematochezia, dark tarry stools, constipation No dysuria, malodorous urine, hematuria or flank pain No new skin rashes, lesions, masses or bruises, No new joint pains, aches, swelling or redness No recent unintentional weight gain or loss No polyuria, polydypsia or polyphagia   Past Medical History:  Diagnosis Date  . 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 There is a question of autoimmune component with weakly positive anti-smooth muscle  antibody and an ANA Child C question component of hepatitis taking that worse. No varices, complicated by hepatic hydrothorax and ascites. No encephalopathy. Prednisolone started early August 2018  . Alcoholism  (HCC)   . Allergy   . Anxiety   . Blood transfusion without reported diagnosis   . 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 Symptomatic anemia 01/19/2017    Past Surgical History:  Procedure Laterality Date  . COLONOSCOPY  01/2017  . ESOPHAGOGASTRODUODENOSCOPY  01/2017  . FERTILITY SURGERY  2003   in vitro fertilization  . IR THORACENTESIS ASP PLEURAL SPACE W/IMG GUIDE  02/21/2017  . WISDOM TOOTH EXTRACTION     age 33    Social History   Social History  . Marital status: Married    Spouse name: Kathlene November  . Number of children: 0  . Years of education: N/A   Occupational History  . [employer]   Social History Main Topics  . Smoking status: Former Smoker    Packs/day: 0.50    Years: 28.00    Types: Cigarettes    Quit date: 03/03/2017  . Smokeless tobacco: Never Used  . Alcohol use No     Comment: quit Spring/Summer 2018- had some this weekend  . Drug use: No  . Sexual activity: Yes   Other Topics Concern  . Not on file   Social History Narrative   Married   Employed at [employer]    Mobility: Independent Work history: Not obtained   No Known Allergies  Family History  Problem Relation Age of Onset  . Brain cancer Mother     Prior to Admission medications   Medication Sig Start Date End Date Taking? Authorizing Provider  escitalopram (LEXAPRO) 20 MG tablet Take 20 mg by mouth daily. 01/18/17  Yes [provider]  feeding supplement, ENSURE ENLIVE, (ENSURE ENLIVE) LIQD Take 237 mLs by mouth 2 (two) times daily between meals. 01/21/17  Yes Ghimire, Werner Lean, MD  ferrous sulfate 325 (65 FE) MG tablet Take 1 tablet (325 mg total) by mouth 2 (two) times daily with a meal. 03/17/17  Yes Zehr, Shanda Bumps D, PA-C  furosemide (LASIX) 40 MG tablet Take 1.5 tablets (60 mg total) by mouth daily. 04/05/17  Yes Iva Boop, MD  Melatonin 10 MG TABS Take 10 mg by mouth at bedtime.   Yes [provider]  Multiple Vitamin  (MULTIVITAMIN WITH MINERALS) TABS tablet Take 1 tablet by mouth daily. 03/21/17  Yes Calvert Cantor, MD  pantoprazole (PROTONIX) 20 MG tablet Take 1 tablet (20 mg total) by mouth daily before breakfast. 02/01/17  Yes Iva Boop, MD  potassium chloride SA (K-DUR,KLOR-CON) 20 MEQ tablet Take 1 tablet (20 mEq total) by mouth daily. 02/24/17 04/15/2017 Yes Beola Cord, MD  prednisoLONE (MILLIPRED) 5 MG TABS tablet PER LAST NOTE FROM 04/25/17 YOU SHOULD BE DOING ALREADY,Taper your prednisone as follows:  starting tomorrow for 4 Days then go to  for 4 days, then  for 4 days, then  for 4 days then STOP 04/24/17  Yes Iva Boop, MD  spironolactone (ALDACTONE) 100 MG tablet Take 2 tablets (200 mg total) by mouth daily. Patient taking differently: Take 250 mg by mouth daily.  04/20/17  Yes Iva Boop, MD  thiamine (VITAMIN B-1) 100 MG tablet Take 1 tablet (100 mg total) by mouth daily. 04/20/17  Yes Iva Boop, MD    Physical Exam: Vitals:   04/24/2017 1435 05/07/2017 1446 04/28/2017 1500 04/10/2017 1545  BP:   (!) 112/57 (!) 109/59  Pulse: (!) 108  (!) 107 (!) 106  Resp: (!) 41  (!) 38 (!) 37  Temp:  (!) 100.5 F (38.1 C)    TempSrc:  Rectal    SpO2: (!) 88%  96% 94%  Weight:      Height:          Constitutional: NAD, calm, comfortable-Overall malnourished appearance Eyes: PERRL, lids and conjunctivae normal ENMT: Mucous membranes are dry. Posterior pharynx clear of any exudate or lesions. Normal dentition.  Neck: normal, supple, no masses, no thyromegaly Respiratory: clear to auscultation bilaterally, no wheezing, no crackles. Normal respiratory effort. No accessory muscle use.  Cardiovascular: Regular rate and rhythm, no rubs / gallops. Grade 3/6 systolic murmur LSB/2nd ICS which appears to be new based on review of previous physical exam documentation. No extremity edema. 2+ pedal pulses. No carotid bruits.  Abdomen: no tenderness, no masses palpated. No  hepatosplenomegaly. Bowel sounds positive. Minimal ascites. Musculoskeletal: no clubbing / cyanosis. No joint deformity upper and lower extremities. Good ROM, no contractures. Atrophied extremities with decreased muscle mass..  Skin: no rashes, lesions, ulcers. No induration Neurologic: CN 2-12 grossly intact. Sensation intact, DTR normal. Strength 5/5 x all 4 extremities.  Psychiatric: Normal judgment and insight. Alert and oriented x 3. Normal mood.    Labs on Admission: I have personally reviewed following labs and imaging studies  CBC:  Recent Labs Lab 05/06/2017 1434  WBC 20.7*  NEUTROABS 17.6*  HGB 9.1*  HCT 26.5*  MCV 100.4*  PLT 159   Basic Metabolic Panel:  Recent Labs Lab 04/19/2017 1434  NA 122*  K 4.7  CL 90*  CO2 21*  GLUCOSE 78  BUN 18  CREATININE 0.87  CALCIUM 8.7*   GFR: Estimated Creatinine Clearance: 61 mL/min (by C-G formula based on SCr of 0.87 mg/dL). Liver Function Tests:  Recent Labs Lab 04/08/2017 1434  AST 80*  ALT 95*  ALKPHOS 96  BILITOT 7.4*  PROT 6.7  ALBUMIN 2.2*   No results for input(s): LIPASE, AMYLASE in the last 168 hours.  Recent Labs Lab 04/25/2017 1448  AMMONIA 31   Coagulation Profile:  Recent Labs Lab 05/01/2017 1434  INR 1.38   Cardiac Enzymes: No results for input(s): CKTOTAL, CKMB, CKMBINDEX, TROPONINI in the last 168 hours. BNP (last 3 results) No results for input(s): PROBNP in the last 8760 hours. HbA1C: No results for input(s): HGBA1C in the last 72 hours. CBG: No results for input(s): GLUCAP in the last 168 hours. Lipid Profile: No results for input(s): CHOL, HDL, LDLCALC, TRIG, CHOLHDL, LDLDIRECT in the last 72 hours. Thyroid Function Tests: No results for input(s): TSH, T4TOTAL, FREET4, T3FREE, THYROIDAB in the last 72 hours. Anemia Panel: No results for input(s): VITAMINB12, FOLATE, FERRITIN, TIBC, IRON, RETICCTPCT in the last 72 hours. Urine analysis:    Component Value Date/Time   COLORURINE  AMBER (A) 04/19/2017 1455   APPEARANCEUR HAZY (A) 05/01/2017 1455   LABSPEC 1.015 04/22/2017 1455   PHURINE 6.0 04/25/2017 1455   GLUCOSEU NEGATIVE 04/28/2017 1455   HGBUR SMALL (A) 04/20/2017 1455   BILIRUBINUR NEGATIVE 04/21/2017 1455   KETONESUR NEGATIVE 04/16/2017 1455   PROTEINUR NEGATIVE 04/24/2017 1455   NITRITE NEGATIVE 04/19/2017 1455   LEUKOCYTESUR TRACE (A) 04/27/2017 1455   Sepsis Labs: (procalcitonin:4,lacticidven:4) )No results found for this or any previous visit (from the past 240 hour(s)).   Radiological Exams on Admission: Dg Chest 2 View  Result Date: 05/04/2017 CLINICAL DATA:  Pt just left the MD office and is being treated for liver problems and today lab work abnormal. Pt states wbc and heart racing, sob. Pt states no fever but felt achy all over and had fever at MD office. EXAM: CHEST  2 VIEW COMPARISON:  03/28/2017 FINDINGS: Heart size is within normal limits. There is moderate right pleural effusion associated with significant right basilar opacity, obscuring the hemidiaphragm. The right basilar opacity may indicate atelectasis and/or infiltrate. Right effusion is slightly less than prior study but remains significant. There is minimal left lower lobe subsegmental atelectasis of left lung is otherwise clear. No pulmonary edema. IMPRESSION: Significant right pleural effusion and right basilar opacity. Electronically Signed   By: Norva Pavlov M.D.   On: 05-07-2017 14:00     Assessment/Plan Principal Problem:   Sepsis 2/2 presumed HCAP (healthcare-associated pneumonia) -Presents with less than 24 hours of fevers, chills, generalized weakness tachycardia and tachypnea with relative hypotension -Persistent leukocytosis since 8/21 -Mild elevation in serum lactate -Chest x-ray concerning for right basilar opacity in setting of recurrent right hepatic hydrothorax -Empiric cefepime/vancomycin IV -Follow up on blood cultures -Obtain sputum  culture -Urinary Legionella -Cycle lactic acid -Procalcitonin -Respiratory viral panel and influenza PCR -NS at 125/hr-decrease rate if develops respiratory symptoms  Active Problems:   Pleural effusion on right 2/2 hepatic hydrothorax -Underwent right thoracentesis on 8/12 with 2.6 L of clear yellow fluid removed -Current chest x-ray shows reaccumulation of hydrothorax but not as significant as prior to thoracentesis -Patient reports chronic dyspnea on exertion therefore given recurrence of hydrothorax will obtain ambulatory oximetry in the event patient may benefit from home O2     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 -Briefly seen at the GI office today-they will see patient in a.m. -Has been on Lasix and spironolactone with recent dosage adjustments -At time of discharge dry weight documented at 122 lbs with current weight 114 lbs -Patient may be somewhat volume depleted and this could be contributing to her symptoms -Completed prednisone taper on 9/24 -Minimal ascites and no abdominal pain so do not think presenting symptoms consistent with SBP (I have canceled IR paracentesis previously ordered but made patient NPO after midnight in the event of the procedure such as thoracentesis indicated)    Chronic hyponatremia -Baseline appears to be around 124-125 -Current sodium 122 and given low dry weight suspect volume depletion -Follow labs    Murmur, cardiac -Has loud systolic murmur which was not documented during previous admission -Echocardiogram from July with normal systolic function and normal diastolic function without valvular abnormalities -Give the new murmur and fevers will obtain echocardiogram-need to follow-up on blood cultures as well    Anemia -Hemoglobin stable and at baseline    Protein calorie malnutrition  -Continue preadmission ensure      DVT prophylaxis: Lovenox Code Status: Full  Family Communication: No family at  bedside  Disposition Plan: Home Consults called: Gastroenterology/2C in a.m. on 9/26    Taelyr Jantz L. ANP-BC [facility] Pager [phone]   If 7PM-7AM, please contact night-coverage [email or web address] Password [facility]  2017/05/07, 5:19 PM

## 2017-05-02 NOTE — Progress Notes (Signed)
Pharmacy Antibiotic Note  Rhonda Haley is a 51 y.o. female admitted on 01-Jun-2017 with pneumonia.  Pharmacy has been consulted for vancomycin dosing.  Plan: 1. Vancomycin 750 mg IV q 12 hrs. 2. Vancomycin trough at steady state as indicated. 3. F/u cultures, renal function and clinical course.  Height:  (172.7 cm) Weight: 114 lb (51.7 kg) IBW/kg (Calculated) : 63.9  Temp (24hrs), Avg:100.2 F (37.9 C), Min:99.7 F (37.6 C), Max:100.5 F (38.1 C)   Recent Labs Lab 06/01/17 1434 2017/06/01 1446 June 01, 2017 1817  WBC 20.7*  --   --   CREATININE 0.87  --   --   LATICACIDVEN  --  2.15* 1.5    Estimated Creatinine Clearance: 61 mL/min (by C-G formula based on SCr of 0.87 mg/dL).    No Known Allergies  Antimicrobials this admission: Cefepime 9/25 >>  vancomycin 9/25 >>   Dose adjustments this admission:   Microbiology results: 9/25 BCx:  9/25 UCx:    Sputum:    MRSA PCR:    Thank you for allowing pharmacy to be a part of this patient's care.  Tad Moore, BCPS  Clinical Pharmacist Pager [phone]  2017-06-01 9:01 PM

## 2017-05-02 NOTE — ED Triage Notes (Signed)
Pt just left the MD office and is being treated for liver problems and today lab work abnormal.  Pt states wbc and heart racing, sob. Pt states no fever but felt achy all over and had fever at MD office. No cough.

## 2017-05-03 ENCOUNTER — Observation Stay (HOSPITAL_COMMUNITY): Payer: 59

## 2017-05-03 ENCOUNTER — Other Ambulatory Visit (HOSPITAL_COMMUNITY): Payer: 59

## 2017-05-03 DIAGNOSIS — R0902 Hypoxemia: Secondary | ICD-10-CM

## 2017-05-03 DIAGNOSIS — R17 Unspecified jaundice: Secondary | ICD-10-CM

## 2017-05-03 DIAGNOSIS — J9601 Acute respiratory failure with hypoxia: Secondary | ICD-10-CM

## 2017-05-03 DIAGNOSIS — A419 Sepsis, unspecified organism: Secondary | ICD-10-CM | POA: Diagnosis present

## 2017-05-03 DIAGNOSIS — E43 Unspecified severe protein-calorie malnutrition: Secondary | ICD-10-CM | POA: Diagnosis present

## 2017-05-03 DIAGNOSIS — D509 Iron deficiency anemia, unspecified: Secondary | ICD-10-CM | POA: Diagnosis not present

## 2017-05-03 DIAGNOSIS — R197 Diarrhea, unspecified: Secondary | ICD-10-CM | POA: Diagnosis not present

## 2017-05-03 DIAGNOSIS — K769 Liver disease, unspecified: Secondary | ICD-10-CM | POA: Diagnosis not present

## 2017-05-03 DIAGNOSIS — K7031 Alcoholic cirrhosis of liver with ascites: Secondary | ICD-10-CM | POA: Diagnosis not present

## 2017-05-03 DIAGNOSIS — K7011 Alcoholic hepatitis with ascites: Secondary | ICD-10-CM | POA: Diagnosis not present

## 2017-05-03 DIAGNOSIS — R0603 Acute respiratory distress: Secondary | ICD-10-CM | POA: Diagnosis not present

## 2017-05-03 DIAGNOSIS — R188 Other ascites: Secondary | ICD-10-CM | POA: Diagnosis not present

## 2017-05-03 DIAGNOSIS — Z9689 Presence of other specified functional implants: Secondary | ICD-10-CM | POA: Diagnosis not present

## 2017-05-03 DIAGNOSIS — Z79899 Other long term (current) drug therapy: Secondary | ICD-10-CM | POA: Diagnosis not present

## 2017-05-03 DIAGNOSIS — N179 Acute kidney failure, unspecified: Secondary | ICD-10-CM | POA: Diagnosis not present

## 2017-05-03 DIAGNOSIS — Z6820 Body mass index (BMI) 20.0-20.9, adult: Secondary | ICD-10-CM | POA: Diagnosis not present

## 2017-05-03 DIAGNOSIS — J9811 Atelectasis: Secondary | ICD-10-CM | POA: Diagnosis present

## 2017-05-03 DIAGNOSIS — R0600 Dyspnea, unspecified: Secondary | ICD-10-CM | POA: Diagnosis not present

## 2017-05-03 DIAGNOSIS — E46 Unspecified protein-calorie malnutrition: Secondary | ICD-10-CM

## 2017-05-03 DIAGNOSIS — D649 Anemia, unspecified: Secondary | ICD-10-CM | POA: Diagnosis not present

## 2017-05-03 DIAGNOSIS — F1020 Alcohol dependence, uncomplicated: Secondary | ICD-10-CM

## 2017-05-03 DIAGNOSIS — R011 Cardiac murmur, unspecified: Secondary | ICD-10-CM

## 2017-05-03 DIAGNOSIS — E871 Hypo-osmolality and hyponatremia: Secondary | ICD-10-CM | POA: Diagnosis present

## 2017-05-03 DIAGNOSIS — R000 Tachycardia, unspecified: Secondary | ICD-10-CM | POA: Diagnosis present

## 2017-05-03 DIAGNOSIS — W19XXXA Unspecified fall, initial encounter: Secondary | ICD-10-CM | POA: Diagnosis present

## 2017-05-03 DIAGNOSIS — N289 Disorder of kidney and ureter, unspecified: Secondary | ICD-10-CM | POA: Diagnosis not present

## 2017-05-03 DIAGNOSIS — R6521 Severe sepsis with septic shock: Secondary | ICD-10-CM | POA: Diagnosis present

## 2017-05-03 DIAGNOSIS — K7290 Hepatic failure, unspecified without coma: Secondary | ICD-10-CM | POA: Diagnosis not present

## 2017-05-03 DIAGNOSIS — Z87891 Personal history of nicotine dependence: Secondary | ICD-10-CM | POA: Diagnosis not present

## 2017-05-03 DIAGNOSIS — E875 Hyperkalemia: Secondary | ICD-10-CM | POA: Diagnosis not present

## 2017-05-03 DIAGNOSIS — J918 Pleural effusion in other conditions classified elsewhere: Secondary | ICD-10-CM | POA: Diagnosis present

## 2017-05-03 DIAGNOSIS — D689 Coagulation defect, unspecified: Secondary | ICD-10-CM | POA: Diagnosis present

## 2017-05-03 DIAGNOSIS — K7030 Alcoholic cirrhosis of liver without ascites: Secondary | ICD-10-CM | POA: Diagnosis not present

## 2017-05-03 DIAGNOSIS — J948 Other specified pleural conditions: Secondary | ICD-10-CM | POA: Diagnosis present

## 2017-05-03 DIAGNOSIS — D72829 Elevated white blood cell count, unspecified: Secondary | ICD-10-CM | POA: Diagnosis not present

## 2017-05-03 DIAGNOSIS — J90 Pleural effusion, not elsewhere classified: Secondary | ICD-10-CM | POA: Diagnosis not present

## 2017-05-03 DIAGNOSIS — D696 Thrombocytopenia, unspecified: Secondary | ICD-10-CM | POA: Diagnosis present

## 2017-05-03 DIAGNOSIS — J189 Pneumonia, unspecified organism: Secondary | ICD-10-CM | POA: Diagnosis present

## 2017-05-03 DIAGNOSIS — R509 Fever, unspecified: Secondary | ICD-10-CM | POA: Diagnosis not present

## 2017-05-03 DIAGNOSIS — E872 Acidosis: Secondary | ICD-10-CM | POA: Diagnosis not present

## 2017-05-03 DIAGNOSIS — R4182 Altered mental status, unspecified: Secondary | ICD-10-CM | POA: Diagnosis not present

## 2017-05-03 DIAGNOSIS — Y95 Nosocomial condition: Secondary | ICD-10-CM | POA: Diagnosis present

## 2017-05-03 DIAGNOSIS — J95811 Postprocedural pneumothorax: Secondary | ICD-10-CM | POA: Diagnosis not present

## 2017-05-03 DIAGNOSIS — D638 Anemia in other chronic diseases classified elsewhere: Secondary | ICD-10-CM | POA: Diagnosis present

## 2017-05-03 DIAGNOSIS — J9382 Other air leak: Secondary | ICD-10-CM | POA: Diagnosis not present

## 2017-05-03 LAB — RESPIRATORY PANEL BY PCR
Adenovirus: NOT DETECTED
BORDETELLA PERTUSSIS-RVPCR: NOT DETECTED
CHLAMYDOPHILA PNEUMONIAE-RVPPCR: NOT DETECTED
CORONAVIRUS 229E-RVPPCR: NOT DETECTED
Coronavirus HKU1: NOT DETECTED
Coronavirus NL63: NOT DETECTED
Coronavirus OC43: NOT DETECTED
INFLUENZA B-RVPPCR: NOT DETECTED
Influenza A: NOT DETECTED
MYCOPLASMA PNEUMONIAE-RVPPCR: NOT DETECTED
Metapneumovirus: NOT DETECTED
Parainfluenza Virus 1: NOT DETECTED
Parainfluenza Virus 2: NOT DETECTED
Parainfluenza Virus 3: NOT DETECTED
Parainfluenza Virus 4: NOT DETECTED
RESPIRATORY SYNCYTIAL VIRUS-RVPPCR: NOT DETECTED
Rhinovirus / Enterovirus: NOT DETECTED

## 2017-05-03 LAB — PROTEIN, PLEURAL OR PERITONEAL FLUID: Total protein, fluid: <3 g/dL

## 2017-05-03 LAB — INFLUENZA PANEL BY PCR (TYPE A & B)
INFLAPCR: NEGATIVE
Influenza B By PCR: NEGATIVE

## 2017-05-03 LAB — CBC
HCT: 28.2 % — ABNORMAL LOW (ref 36.0–46.0)
HEMOGLOBIN: 9.3 g/dL — AB (ref 12.0–15.0)
MCH: 33.3 pg (ref 26.0–34.0)
MCHC: 33 g/dL (ref 30.0–36.0)
MCV: 101.1 fL — AB (ref 78.0–100.0)
PLATELETS: 120 10*3/uL — AB (ref 150–400)
RBC: 2.79 MIL/uL — AB (ref 3.87–5.11)
RDW: 15.6 % — ABNORMAL HIGH (ref 11.5–15.5)
WBC: 20.5 10*3/uL — AB (ref 4.0–10.5)

## 2017-05-03 LAB — COMPREHENSIVE METABOLIC PANEL
ALT: 83 U/L — AB (ref 14–54)
ANION GAP: 10 (ref 5–15)
AST: 78 U/L — ABNORMAL HIGH (ref 15–41)
Albumin: 2 g/dL — ABNORMAL LOW (ref 3.5–5.0)
Alkaline Phosphatase: 88 U/L (ref 38–126)
BUN: 13 mg/dL (ref 6–20)
CHLORIDE: 93 mmol/L — AB (ref 101–111)
CO2: 20 mmol/L — AB (ref 22–32)
CREATININE: 0.88 mg/dL (ref 0.44–1.00)
Calcium: 8.2 mg/dL — ABNORMAL LOW (ref 8.9–10.3)
Glucose, Bld: 103 mg/dL — ABNORMAL HIGH (ref 65–99)
Potassium: 4.1 mmol/L (ref 3.5–5.1)
SODIUM: 123 mmol/L — AB (ref 135–145)
Total Bilirubin: 7 mg/dL — ABNORMAL HIGH (ref 0.3–1.2)
Total Protein: 6.6 g/dL (ref 6.5–8.1)

## 2017-05-03 LAB — GLUCOSE, PLEURAL OR PERITONEAL FLUID: Glucose, Fluid: 115 mg/dL

## 2017-05-03 LAB — STREP PNEUMONIAE URINARY ANTIGEN: STREP PNEUMO URINARY ANTIGEN: NEGATIVE

## 2017-05-03 LAB — BODY FLUID CELL COUNT WITH DIFFERENTIAL
LYMPHS FL: 62 %
MONOCYTE-MACROPHAGE-SEROUS FLUID: 26 % — AB (ref 50–90)
Neutrophil Count, Fluid: 12 % (ref 0–25)
WBC FLUID: 24 uL (ref 0–1000)

## 2017-05-03 LAB — PROTIME-INR
INR: 1.56
PROTHROMBIN TIME: 18.6 s — AB (ref 11.4–15.2)

## 2017-05-03 LAB — LACTATE DEHYDROGENASE, PLEURAL OR PERITONEAL FLUID: LD FL: 34 U/L — AB (ref 3–23)

## 2017-05-03 LAB — APTT: aPTT: 35 seconds (ref 24–36)

## 2017-05-03 LAB — LACTATE DEHYDROGENASE: LDH: 211 U/L — AB (ref 98–192)

## 2017-05-03 LAB — HIV ANTIBODY (ROUTINE TESTING W REFLEX): HIV SCREEN 4TH GENERATION: NONREACTIVE

## 2017-05-03 MED ORDER — MIDAZOLAM HCL 2 MG/2ML IJ SOLN
2.0000 mg | Freq: Once | INTRAMUSCULAR | Status: AC
  Administered 2017-05-03: 2 mg via INTRAVENOUS
  Filled 2017-05-03: qty 2

## 2017-05-03 MED ORDER — SPIRONOLACTONE 25 MG PO TABS
250.0000 mg | ORAL_TABLET | Freq: Every day | ORAL | Status: DC
  Administered 2017-05-03 – 2017-05-04 (×2): 250 mg via ORAL
  Filled 2017-05-03 (×3): qty 10

## 2017-05-03 MED ORDER — LACTULOSE 10 GM/15ML PO SOLN
10.0000 g | Freq: Every day | ORAL | Status: DC
  Administered 2017-05-03 – 2017-05-06 (×4): 10 g via ORAL
  Filled 2017-05-03 (×4): qty 15

## 2017-05-03 MED ORDER — FUROSEMIDE 40 MG PO TABS
60.0000 mg | ORAL_TABLET | Freq: Every day | ORAL | Status: DC
  Administered 2017-05-03 – 2017-05-04 (×2): 60 mg via ORAL
  Filled 2017-05-03 (×3): qty 1

## 2017-05-03 MED ORDER — FENTANYL CITRATE (PF) 100 MCG/2ML IJ SOLN
50.0000 ug | Freq: Once | INTRAMUSCULAR | Status: DC
  Filled 2017-05-03: qty 2

## 2017-05-03 MED ORDER — ADULT MULTIVITAMIN W/MINERALS CH
1.0000 | ORAL_TABLET | Freq: Every day | ORAL | Status: DC
  Administered 2017-05-03 – 2017-05-06 (×4): 1 via ORAL
  Filled 2017-05-03 (×4): qty 1

## 2017-05-03 NOTE — Progress Notes (Signed)
Initial Nutrition Assessment  DOCUMENTATION CODES:   Severe malnutrition in context of chronic illness, Underweight  INTERVENTION:   -Continue Ensure Enlive po BID, each supplement provides 350 kcal and 20 grams of protein -MVI daily  NUTRITION DIAGNOSIS:   Malnutrition (Severe) related to chronic illness (ETOH cirrhosis) as evidenced by energy intake < 75% for > or equal to 1 month, severe depletion of body fat, severe depletion of muscle mass, moderate depletion of body fat, moderate depletions of muscle mass.  GOAL:   Patient will meet greater than or equal to 90% of their needs  MONITOR:   PO intake, Supplement acceptance, Labs, Weight trends, Skin, I & O's  REASON FOR ASSESSMENT:   Malnutrition Screening Tool    ASSESSMENT:   Rhonda Haley is a 52 y.o. female who presents with sepsis from HCAP in the setting of severe ETOH cirrhosis and recurrent pleural effusion. IR consulted at time of admission for thoracentesis.   Pt admitted with sepsis related to HCAP in the setting of ETOH abise.   9/26- s/p thoracentesis for pleural effusion- 1200 ml output noted  Spoke with pt, who complains of early satiety. She generally consumes 3 small meals and 2 Ensure supplements daily (meals include fruit and yogurt or half sandwich). She reports her appetite has improved since hospitalization and consumed almost all of her breakfast this morning (fruit, pancakes, and bacon).   Pt reports her UBW of 143#. She reports weight loss is also multifactorial (poor intake and fluid losses). Noted a 21.7% wt loss over the past 3 months, which is significant, however, due to fluid losses may be inaccurate.   Nutrition-Focused physical exam completed. Findings are moderate to severe fat depletion, moderate to severemuscle depletion, and no edema.   Discussed importance of good meal and supplement acceptance to promote healing. Dicussed consuming small, frequent meals to help mange improved intake  with early satiety. Encouraged pt to continue to consume supplements.   Medications reviewed and inclide ferrous sulfate and thiamine.   Labs reviewed: Na: 123.   Diet Order:  Diet regular Room service appropriate? Yes; Fluid consistency: Thin  Skin:  Reviewed, no issues  Last BM:  May 11, 2017  Height:   Ht Readings from Last 1 Encounters:  05/03/17  (1.727 m)    Weight:   Wt Readings from Last 1 Encounters:  05/03/17 112 lb 14 oz (51.2 kg)    Ideal Body Weight:  63.6 kg  BMI:  Body mass index is 17.16 kg/m.  Estimated Nutritional Needs:   Kcal:  1600-1800  Protein:  90-105 grams  Fluid:  1.6-1.8 L  EDUCATION NEEDS:   Education needs addressed  Ronal Maybury A. Mayford Knife, RD, LDN, CDE Pager: [phone] After hours Pager: [phone]

## 2017-05-03 NOTE — Progress Notes (Signed)
Pt onto floor at ~2050. Pt is very SOB, RR in low 40s, HR 130-140s, and O2 sat 87% on 2L via [place]. Pt placed on 4L [place], with O2 sat increasing to 90. Lungs are coarse on left and diminished on right side. Pt reports slight cough since coming to hospital. Pt oral temp on arrival was 100.1. Pt felt warm and was unable to tolerated laying flat for rectal temp. Oral temp rechecked later to be 102.7. MD paged and made aware of pt. SWOT team and rapid team notified and came to pt's bedside. EKG and CXR obtained. MD at bedside. Orders placed for lasix  ivx1 and ibuprofen  po x1 with good affect. Pt resting more comfortable in bed, RR in the low 30s and HR 120s. Pt stating "i feel much better". Pt obtained transfer orders. Report given to [facility] RN. Derwood Kaplan

## 2017-05-03 NOTE — Procedures (Signed)
Thoracentesis Procedure Note  Pre-operative Diagnosis: right pleural effusion, severe sepsis, acute hypoxic respiratory failure  Post-operative Diagnosis: same  Indications: Dx and Tx  Procedure Details  Consent: Informed consent was obtained. Risks of the procedure were discussed including: infection, bleeding, pain, pneumothorax.  Bedside US showed effusion >7cm, no lung or liver in rib space above or below  Under sterile conditions the patient was positioned. Chlorhexidine solution and sterile drapes were utilized.  1% buffered lidocaine was used to anesthetize the rib space. Fluid was obtained without any difficulties with finder needle.  Skin nick with scalpel.  Initial thoracentesis cathter was inserted and fluid was aspirated but catheter would not advance into pleural space. Catheter was removed and pressure applied with minimal blood loss.  US demonstrated effusion still present >7 cm at rib space above and below.  Apical lung sliding present.  A second kit was obtained. Under sterile conditions chlorhexidine solution and sterile drapes were utilized.  1% buffered lidocaine was used to anesthetize the rib space. Finder needle was advanced an pleural fluid aspirated, thoracentesis cathetr was advanced into pleural space with easy return of fluid.  Cathter was removed. A dressing was applied to the wound and wound care instructions were provided.   Findings 1200 ml of clear pleural fluid was obtained. A sample was sent to Pathology for cytogenetics, flow, and cell counts, as well as for infection analysis.  Complications:  None; patient tolerated the procedure well.          Condition: stable  Plan A follow up chest x-ray was ordered. Bed Rest overnight, c/w step down monitoring.  Charlesetta Garibaldi, MD  Critical Care Medicine [place] Pager: [phone]

## 2017-05-03 NOTE — Progress Notes (Signed)
Daily Rounding Note  05/03/2017, 8:17 AM  LOS: 0 days   SUBJECTIVE:   Chief complaint: None.       Feeling much improved after thoracentesis.  Slept well.   Last BM was yesterday AM.    OBJECTIVE:         Vital signs in last 24 hours:    Temp:  [99.4 F (37.4 C)-102.7 F (39.3 C)] 99.4 F (37.4 C) (09/26 0048) Pulse Rate:  [91-140] 91 (09/26 0530) Resp:  [22-41] 23 (09/26 0530) BP: (77-112)/(46-78) 91/47 (09/26 0530) SpO2:  [88 %-100 %] 96 % (09/26 0530) Weight:  [51.2 kg (112 lb 14 oz)-52.2 kg (115 lb 1.3 oz)] 51.2 kg (112 lb 14 oz) (09/26 0600) Last BM Date: 05/20/17 Filed Weights   05/20/17 1239 05/03/17 0048 05/03/17 0600  Weight: 51.7 kg (114 lb) 52.2 kg (115 lb 1.3 oz) 51.2 kg (112 lb 14 oz)   General: Thin, malnourished, sallow/jaundiced.  Looks chronically ill   Heart: RRR.  No MRG.   Chest: clear bil.  Diminished BS in bases bil no cough or dyspnea  Abdomen: soft, NT, active BS.  ND.  No obvious ascites  Extremities: no CCE.  Global muscle wasting.  Neuro/Psych:  Tremor in hands vs mild asterixis.  Not able to name year but knew it was the end of September, knows place and situation.    Intake/Output from previous day: 09/25 0701 - 09/26 0700 In: 1450 [I.V.:50; IV Piggyback:1400] Out: 200 [Urine:200]  Intake/Output this shift: No intake/output data recorded.  Lab Results:  Recent Labs  05/20/17 1434 05/03/17 0209  WBC 20.7* 20.5*  HGB 9.1* 9.3*  HCT 26.5* 28.2*  PLT 159 120*   BMET  Recent Labs  May 20, 2017 1434 05/03/17 0209  NA 122* 123*  K 4.7 4.1  CL 90* 93*  CO2 21* 20*  GLUCOSE 78 103*  BUN 18 13  CREATININE 0.87 0.88  CALCIUM 8.7* 8.2*   LFT  Recent Labs  May 20, 2017 1434 05/03/17 0209  PROT 6.7 6.6  ALBUMIN 2.2* 2.0*  AST 80* 78*  ALT 95* 83*  ALKPHOS 96 88  BILITOT 7.4* 7.0*   PT/INR  Recent Labs  05-20-2017 1434 05/03/17 0209  LABPROT 16.9* 18.6*  INR 1.38  1.56   Hepatitis Panel No results for input(s): HEPBSAG, HCVAB, HEPAIGM, HEPBIGM in the last 72 hours.  Studies/Results: Dg Chest 2 View  Result Date: May 20, 2017 CLINICAL DATA:  Pt just left the MD office and is being treated for liver problems and today lab work abnormal. Pt states wbc and heart racing, sob. Pt states no fever but felt achy all over and had fever at MD office. EXAM: CHEST  2 VIEW COMPARISON:  03/28/2017 FINDINGS: Heart size is within normal limits. There is moderate right pleural effusion associated with significant right basilar opacity, obscuring the hemidiaphragm. The right basilar opacity may indicate atelectasis and/or infiltrate. Right effusion is slightly less than prior study but remains significant. There is minimal left lower lobe subsegmental atelectasis of left lung is otherwise clear. No pulmonary edema. IMPRESSION: Significant right pleural effusion and right basilar opacity. Electronically Signed   By: Norva Pavlov M.D.   On: May 20, 2017 14:00   Dg Chest Port 1 View  Result Date: 05/03/2017 CLINICAL DATA:  Status post thoracentesis. EXAM: PORTABLE CHEST 1 VIEW COMPARISON:  Radiograph yesterday at 2202 hour FINDINGS: Decreased size of right pleural effusion from prior exam, moderate pleural fluid persists. No  evidence of pneumothorax. Worsening left lung aeration with diffuse patchy opacities. Unchanged heart size and mediastinal contours allowing for differences in technique. IMPRESSION: 1. Decreased size of right pleural effusion post thoracentesis, moderate volume pleural fluid persists. 2. Worsening patchy opacities throughout the left lung, pulmonary edema versus multifocal pneumonia or combination thereof. Electronically Signed   By: Rubye Oaks M.D.   On: 05/03/2017 02:34   Dg Chest Port 1 View  Result Date: 04/25/2017 CLINICAL DATA:  Respiratory distress EXAM: PORTABLE CHEST 1 VIEW COMPARISON:  04/29/2017, 03/28/2017 FINDINGS: Moderate large right  pleural effusion, increased compared to the previous radiograph. Dense right middle and lower lobe consolidation. Patchy left mid and lower lung infiltrates similar to slight increase. Partial obscuration of cardiomediastinal silhouette. No pneumothorax. IMPRESSION: 1. Moderate large right pleural effusion, increased compared to the radiograph performed earlier today 2. Increased left lung base and mid lung opacity suspicious for pulmonary infiltrates. No change in dense right middle lobe and lung base consolidation. 3. Increased vascular congestion and hazy perihilar opacity which may reflect mild edema Electronically Signed   By: Jasmine Pang M.D.   On: 04/11/2017 22:19   Scheduled Meds: . escitalopram  20 mg Oral Daily  . feeding supplement (ENSURE ENLIVE)  237 mL Oral BID BM  . ferrous sulfate  325 mg Oral BID WC  . ibuprofen      . ibuprofen      . ibuprofen  600 mg Oral Once  . pantoprazole  20 mg Oral QAC breakfast  . sodium chloride flush  3 mL Intravenous Q12H  . thiamine  100 mg Oral Daily   Continuous Infusions: . ceFEPime (MAXIPIME) IV 1 g (05/03/17 0730)  . vancomycin Stopped (05/03/17 0019)   PRN Meds:.ondansetron **OR** ondansetron (ZOFRAN) IV   ASSESMENT:   *  Cirrhosis.  ETOH and weakly + autoimmune markers.  ETOH level abnormal at <10, normal is <5.   Started Prednisolone 8/10. Had been in midst of 16 day Prednisolone taper, started 9/19- planned last dose 10/2.   No prednisolone currently in place.  Discriminant fx score 30.  Corticosteroids of benefit with score >/= to 32.  *  Right pleural effusion. 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   S/p 1.2 liter thoracentesis 9/26.  Fluid WBCs 24.  Worsening left lung opacities , ?HCAP/CAP vs CHF?Marland Kitchen  Hypoxic resp failure.  Day 2 abx.  Vanc, Maxipime.    *  Sepsis. Likely pulmonary.  Note cancellation of US paracentesis orders by admitting team due to "minimal ascites, no abd pain".  However last abd ultrasound was 8/12.    *   Hyponatremia. Chronic.    *  Normocytic anemia.  Overall improved from previous measures in last several months.    *  Thrombocytopenia.  Non-critical.  Hx of same. In last 2 months.   *  Protein calorie malnutrition.        PLAN   *  Re-order paracentesis with fluid cell count?.  Does not need NPO for this so reordered regular diet.     *  Do we need to continue Prednisolone taper to finish date?  *  Adding low dose Lactulose for mild memory impairment and tremor vs asterixis despite normal ammonia level.      Jennye Moccasin  05/03/2017, 8:17 AM Pager: [phone]

## 2017-05-03 NOTE — Consult Note (Signed)
Name: Rhonda Haley MRN: [id number] DOB: 12/25/1965    ADMISSION DATE:  14-May-2017 CONSULTATION DATE:  05-14-2017  REFERRING MD :  Dr. Konrad Dolores   CHIEF COMPLAINT:  Hypoxia   HISTORY OF PRESENT ILLNESS:   51 year old female with female with PMH of ETOH cirrhosis with ascites, depression, right side hydrothorax (last thoracentesis on 8/12 yielding 2.6L), anemia, chronic hyponatremia, and protein calorie malnutrition     Presents to ED on 9/25 with tachycardia and hypotension. Upon arrival was noted to be hypoxic, CXR with increasing effusion and infiltrate at the right base. Admitted possible HCAP. Treated with cefepime and vancomycin. Later in the day developed increased work of breathing with RR 30-35 and diminished breath sounds. CXR with large right pleural effusion. PCCM asked to consult   SIGNIFICANT EVENTS  9/25 > Presents to ED   STUDIES:  CXR 9/25 > Heart size is within normal limits. There is moderate right pleural effusion associated with significant right basilar opacity, obscuring the hemidiaphragm. The right basilar opacity may indicate atelectasis and/or infiltrate. Right effusion is slightly less than prior study but remains significant. There is minimal left lower lobe subsegmental atelectasis of left lung is otherwise clear. No pulmonary edema. CXR 9/25 > 1. Moderate large right pleural effusion, increased compared to the radiograph performed earlier today 2. Increased left lung base and mid lung opacity suspicious for pulmonary infiltrates. No change in dense right middle lobe and lung base consolidation. 3. 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  has a past surgical history that includes Fertility Surgery (2003); Wisdom tooth extraction; IR THORACENTESIS ASP PLEURAL SPACE W/IMG GUIDE (02/21/2017); Esophagogastroduodenoscopy (01/2017); Colonoscopy (01/2017); Refractive surgery (Bilateral, ~ 2012); and Eye surgery. Prior to Admission medications   Medication Sig Start Date End Date Taking? Authorizing Provider  escitalopram (LEXAPRO) 20 MG tablet Take 20 mg by mouth daily. 01/18/17  Yes [provider]  feeding supplement, ENSURE ENLIVE, (ENSURE ENLIVE) LIQD Take 237 mLs by mouth 2 (two) times daily between meals. 01/21/17  Yes Ghimire, Werner Lean, MD  ferrous sulfate 325 (65 FE) MG tablet Take 1 tablet (325 mg total) by mouth 2 (two) times daily with a meal. 03/17/17  Yes Zehr, Shanda Bumps D, PA-C  furosemide (LASIX) 40 MG tablet Take 1.5 tablets (60 mg total) by mouth daily. 04/05/17  Yes Iva Boop, MD  Melatonin 10 MG TABS Take 10 mg by mouth at bedtime.   Yes [provider]  Multiple Vitamin (MULTIVITAMIN WITH MINERALS) TABS tablet Take 1 tablet by mouth daily. 03/21/17  Yes Calvert Cantor, MD  pantoprazole (PROTONIX) 20 MG tablet Take 1 tablet (20 mg total) by mouth daily before breakfast. 02/01/17  Yes Iva Boop, MD  potassium chloride SA (K-DUR,KLOR-CON) 20 MEQ tablet Take 1 tablet (20 mEq total) by mouth daily. 02/24/17 May 14, 2017 Yes Beola Cord, MD  prednisoLONE (MILLIPRED) 5 MG TABS tablet PER LAST NOTE FROM 04/25/17 YOU SHOULD BE DOING ALREADY,Taper your prednisone as follows:  starting tomorrow for 4 Days then go to  for 4 days, then  for 4 days, then  for 4 days then STOP 04/24/17  Yes Iva Boop, MD  spironolactone (ALDACTONE) 100 MG tablet Take 2 tablets (  200 mg total) by mouth daily. Patient taking differently: Take 250 mg by mouth daily.  04/20/17  Yes Iva Boop, MD  thiamine (VITAMIN B-1) 100 MG tablet Take 1 tablet (100 mg total) by  mouth daily. 04/20/17  Yes Iva Boop, MD   No Known Allergies  FAMILY HISTORY:  family history includes Brain cancer in her mother. SOCIAL HISTORY:  reports that she has been smoking Cigarettes.  She has a 8.25 pack-year smoking history. She has never used smokeless tobacco. She reports that she drinks alcohol. She reports that she does not use drugs.  REVIEW OF SYSTEMS:   All negative; except for those that are bolded, which indicate positives.  Constitutional: weight loss, weight gain, night sweats, fevers, chills, fatigue, weakness.  HEENT: headaches, sore throat, sneezing, nasal congestion, post nasal drip, difficulty swallowing, tooth/dental problems, visual complaints, visual changes, ear aches. Neuro: difficulty with speech, weakness, numbness, ataxia. CV:  chest pain, orthopnea, PND, swelling in lower extremities, dizziness, palpitations, syncope.  Resp: cough, hemoptysis, dyspnea, wheezing. GI: heartburn, indigestion, abdominal pain, nausea, vomiting, diarrhea, constipation, change in bowel habits, loss of appetite, hematemesis, melena, hematochezia.  GU: dysuria, change in color of urine, urgency or frequency, flank pain, hematuria. MSK: joint pain or swelling, decreased range of motion. Psych: change in mood or affect, depression, anxiety, suicidal ideations, homicidal ideations. Skin: rash, itching, bruising.   SUBJECTIVE:   VITAL SIGNS: Temp:  [99.6 F (37.6 C)-102.7 F (39.3 C)] 99.6 F (37.6 C) (09/26 0000) Pulse Rate:  [96-140] 124 (09/25 2200) Resp:  [23-41] 26 (09/25 1900) BP: (90-112)/(56-78) 106/62 (09/25 2200) SpO2:  [88 %-100 %] 92 % (09/25 2215) Weight:  [51.7 kg (114 lb)-51.8 kg (114 lb 2 oz)] 51.7 kg (114 lb) (09/25 1239)  PHYSICAL EXAMINATION: General:  Adult female, mild respiratory Distress  Neuro:  Alert, oriented, follows commands  HEENT: jaundice to sclera  Cardiovascular:  Tachy, systolic murmur noted   Lungs:  Diminished breath sounds to  bases, no wheeze/crackles  Abdomen:  Distended, active bowel sounds  Musculoskeletal:  -edema  Skin:  Warm, dry    Recent Labs Lab 05/10/2017 1434  NA 122*  K 4.7  CL 90*  CO2 21*  BUN 18  CREATININE 0.87  GLUCOSE 78    Recent Labs Lab 05-10-17 1434  HGB 9.1*  HCT 26.5*  WBC 20.7*  PLT 159   Dg Chest 2 View  Result Date: May 10, 2017 CLINICAL DATA:  Pt just left the MD office and is being treated for liver problems and today lab work abnormal. Pt states wbc and heart racing, sob. Pt states no fever but felt achy all over and had fever at MD office. EXAM: CHEST  2 VIEW COMPARISON:  03/28/2017 FINDINGS: Heart size is within normal limits. There is moderate right pleural effusion associated with significant right basilar opacity, obscuring the hemidiaphragm. The right basilar opacity may indicate atelectasis and/or infiltrate. Right effusion is slightly less than prior study but remains significant. There is minimal left lower lobe subsegmental atelectasis of left lung is otherwise clear. No pulmonary edema. IMPRESSION: Significant right pleural effusion and right basilar opacity. Electronically Signed   By: Norva Pavlov M.D.   On: 05/10/17 14:00   Dg Chest Port 1 View  Result Date: 2017-05-10 CLINICAL DATA:  Respiratory distress EXAM: PORTABLE CHEST 1 VIEW COMPARISON:  May 10, 2017, 03/28/2017 FINDINGS: Moderate large right pleural effusion, increased compared to the previous radiograph. Dense right middle and lower lobe consolidation. Patchy left mid and lower  lung infiltrates similar to slight increase. Partial obscuration of cardiomediastinal silhouette. No pneumothorax. IMPRESSION: 1. Moderate large right pleural effusion, increased compared to the radiograph performed earlier today 2. Increased left lung base and mid lung opacity suspicious for pulmonary infiltrates. No change in dense right middle lobe and lung base consolidation. 3. Increased vascular congestion and hazy  perihilar opacity which may reflect mild edema Electronically Signed   By: Jasmine Pang M.D.   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

## 2017-05-03 NOTE — Progress Notes (Signed)
PROGRESS NOTE    Rhonda Haley  RUE:[id number] DOB: [birth year] DOA: 05-04-17 PCP: Lewis Moccasin, MD   Brief Narrative: Rhonda Haley is a 51 y.o. female with medical history significant for alcoholic cirrhosis with associated ascites, recurrent right hepatic hydrothorax, chronic hyponatremia, protein calorie malnutrition and anemia. Patient had been started on low-dose prednisone (03/21/17) for acute alcoholic hepatitis in the setting of weakly positive autoimmune markers. Over the past 2 weeks prednisone has been tapered to off with last dosage on 9/24. Patient has had asymptomatic leukocytosis dating back to 8/21 where her white count was documented at 23,300 with repeat CBC on 9/12 white count 29,900. On the morning of admission patient awakened with generalized malaise, weakness, chills and subjective fevers. She was not having any abdominal pain, nausea vomiting or diarrhea. She is not had any coughing or sick contacts. She followed up with the GI office she was noted to have a low-grade temperature of 100.4, heart rate was 140, BP was 90/56. Because of the symptoms she was sent to the ER for further evaluation.   In the ER she continued with low-grade temperature and tachycardia as well as tachypnea with a low but normal blood pressure readings. Her white count was 20,700 with left shift as previous. Her lactic acid was 2.15. Her sodium was 122. Abdominal exam was benign. Chest x-ray revealed significant right pleural effusion and right basilar opacity. EDP is concerned over possible HCAP and requests evaluation for observation admission. She is not hypoxic at rest. She does report dyspnea on exertion which is chronic. She subsequently underwent emergent Thoracentesis and was transferred to SDU. She is improved and GI was consulted for further evaluation and Recc's. PCCM following for her Respiratory Failure.   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   Pleural effusion on right 2/2 hepatic hydrothorax   Anemia   Protein calorie malnutrition (HCC)   Chronic hyponatremia   HCAP (healthcare-associated pneumonia)   Murmur, cardiac   Acute respiratory distress   Pleural effusion associated with hepatic disorder  Sepsis 2/2 presumed HCAP (healthcare-associated pneumonia) -Presents with less than 24 hours of fevers, chills, generalized weakness tachycardia and tachypnea with relative hypotension -Persistent leukocytosis since 8/21 as patient has been on Steroids and WBC now 20.5  -Mild elevation in serum lactate and went from 2.15 -> 1.5 -> 2.4 -Chest x-ray concerning for right basilar opacity in setting of recurrent right hepatic hydrothorax -Empiric cefepime/vancomycin IV -Follow up on blood cultures -Obtain sputum culture -Urinary Legionella -Cycle lactic acid -Procalcitonin pending  -Respiratory viral panel and influenza PCR Negative  -NS at 125/hr decreased as patient became symptomatic and had ot have Thoracentesis   Acute Hypoxic Respiratory Failure from HCAP and Pleural effusion on right 2/2 hepatic hydrothorax s/p Emergent Thoracentesis  -C/w Supplemental O2 and wean as tolerated -Follow Cx Data  Pleural effusion on right 2/2 hepatic hydrothorax s/p Emergent Thoracentesis  -Underwent right thoracentesis on 8/12 with 2.6 L of clear yellow fluid removed and underwent Thoracentesis on 9/26 this AM and had 1.2 Liters removed. IVF stopped and given IV Lasix 40 mg Once -Current chest x-ray shows reaccumulation of hydrothorax but not as significant as prior to thoracentesis -Patient reports chronic dyspnea on exertion  therefore given recurrence of hydrothorax will obtain ambulatory oximetry  in the event patient may benefit from home O2 in AM -PCCM Consulted and following and appreciate Recc's; Recommending continuing Abx and perform further Thoracentesis if Significant Dyspnena  -Pleural Effusion was Transudative    Decompensated 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 -Briefly seen at the GI office today-they will see patient in a.m. -Has been on Lasix and spironolactone with recent dosage adjustments -AST was 78 and ALT was 83; Recently had EtOH  -At time of discharge dry weight documented at 122 lbs with current weight 114 lbs -Patient may be somewhat volume depleted and this could be contributing to her symptoms -Completed prednisone taper on 9/24 but will likely need to restart Prednisone  -Minimal ascites and no abdominal pain so do not think presenting symptoms consistent with SBP (Canceled IR paracentesis previously ordered but made patient NPO after midnight in the event of the procedure such as thoracentesis indicated) -Gastroenterology consulted and recommending continuing Abx and resume Regular Diet with Protein Supplements -GI recommending resuming Prednisolone Taper (Will discuss with Dr. Adela Lank) and resuming Diuretics for the treatment of Hydrothorax/Ascites; restarted Home Lasix and Spironolactone -C/w Low Sodium Diet and start Lactulose 10 grams po Daily   -C/w MVI, Folic Acid and Thiamine   Chronic Hyponatremia -Baseline appears to be around 124-125 -Current sodium 123 and likely from Ascites -Follow labs and repeat CMP in AM   Murmur, cardiac -Has loud systolic murmur which was not documented during previous admission -Echocardiogram from July with normal systolic function and normal diastolic function without valvular abnormalities -Give the new murmur and fevers will obtain echocardiogram-need to follow-up on blood cultures as well -Showed EF of 60-65%  Anemia of Chronic Disease and IDA -Hemoglobin stable and at  baseline -Hb/Hct is stable 9.3/28.2 -C/w Ferrous Sulfate 325 mg po BID  -Continue to Monitor for S/Sx of Bleeding  Protein calorie malnutrition  -Continue preadmission ensure -Nutritionist Consulted for futher reccs  Thrombocytopenia -Likely from EtOH  -Continue to Monitor closely and repeat CBC in AM   Hyperbilirubinemia -Likely from EtOHism and Decompensated Liver Cirrhosis with Ascites -Continue to Monitor and repeat CMP in AM   DVT prophylaxis: SCDs given Anemia and Thrombocytopenia  Code Status: FULL CODE Family Communication: No Family present at bedside  Disposition Plan: Remain in SDU  Consultants:   PCCM  Gastroenterology   Procedures: Thoracentesis with removal of 1.2 Liters of Fluid   ECHOCARDIOGRAM Study Conclusions  - Left ventricle: The cavity size was normal. Systolic function was   normal. The estimated ejection fraction was in the range of 60%   to 65%. Wall motion was normal; there were no regional wall   motion abnormalities. Left ventricular diastolic function   parameters were normal. Doppler parameters are consistent with   high ventricular filling pressure. - Aortic valve: Transvalvular velocity was within the normal range.   There was no stenosis. There was no regurgitation. Valve area   (VTI): 1.47 cm^2. Valve area (Vmax): 1.47 cm^2. Valve area   (Vmean): 1.57 cm^2. - Mitral valve: Transvalvular velocity was within the normal range.   There was no evidence for stenosis. There was trivial   regurgitation. - Right ventricle: The cavity size was normal. Wall thickness was   normal. Systolic function was normal. - Tricuspid valve: There was no regurgitation. - Pericardium, extracardiac: A trivial pericardial effusion was   identified. Minimal ascites was noted.   Antimicrobials: Anti-infectives    Start  Dose/Rate Route Frequency Ordered Stop   04/29/2017 2200  ceFEPIme (MAXIPIME) 1 g in dextrose 5 % 50 mL IVPB     1 g 100 mL/hr over  30 Minutes Intravenous Every 8 hours 04/15/2017 2055 05/10/17 2159   04/13/2017 2130  vancomycin (VANCOCIN) IVPB 750 mg/150 ml premix     750 mg 150 mL/hr over 60 Minutes Intravenous Every 12 hours 05/04/2017 2102     05/04/2017 1615  vancomycin (VANCOCIN) IVPB 1000 mg/200 mL premix     1,000 mg 200 mL/hr over 60 Minutes Intravenous  Once 04/11/2017 1614 04/24/2017 1736   04/08/2017 1615  piperacillin-tazobactam (ZOSYN) IVPB 3.375 g     3.375 g 100 mL/hr over 30 Minutes Intravenous  Once 04/12/2017 1614 04/21/2017 1758     Subjective: Seen and examined at bedside and felt as if breathing was better. No CP or Lightheadedness. No other concerns or complaints at this time.   Objective: Vitals:   05/03/17 0600 05/03/17 0800 05/03/17 1200 05/03/17 1600  BP:  (!) 90/49  105/63  Pulse:  90  (!) 130  Resp:  (!) 36  (!) 42  Temp:  98 F (36.7 C) 98.5 F (36.9 C) 99.7 F (37.6 C)  TempSrc:   Oral   SpO2:  96%  92%  Weight: 51.2 kg (112 lb 14 oz)     Height:        Intake/Output Summary (Last 24 hours) at 05/03/17 1946 Last data filed at 05/03/17 1700  Gross per 24 hour  Intake             1070 ml  Output              700 ml  Net              370 ml   Filed Weights   04/18/2017 1239 05/03/17 0048 05/03/17 0600  Weight: 51.7 kg (114 lb) 52.2 kg (115 lb 1.3 oz) 51.2 kg (112 lb 14 oz)    Examination: Physical Exam:  Constitutional:  Chronically ill appearing Jaundiced Caucasian female who appears calm and comfortable Eyes: Lids and conjunctivae normal, sclerae icteric.  ENMT: External Ears, Nose appear normal. Grossly normal hearing. Mucous membranes are moist. Neck: Appears normal, supple, no cervical masses, normal ROM, no appreciable thyromegaly, no JVD Respiratory: Diminished to auscultation bilaterally, no wheezing, rales, rhonchi or crackles. Coarse breath sounds on the Right. Normal respiratory effort and patient is not tachypenic. No accessory muscle use.  Cardiovascular: RRR, Loud 3/6  Systolic Murmur. S1 and S2 auscultated. Trace extremity edema. 2+ pedal pulses. Abdomen: Soft, non-tender, non-distended. No masses palpated. No appreciable hepatosplenomegaly. Bowel sounds positive.  GU: Deferred. Musculoskeletal: No contractures; No cyanosis Skin: No rashes, lesions, ulcers on limited skin eval. No induration; Warm and dry.  Neurologic: CN 2-12 grossly intact with no focal deficits. Had a mild tremor. Romberg sign cerebellar reflexes not assessed.  Psychiatric: Impaired judgment and insight. Alert and awake. Normal mood and appropriate affect.   Data Reviewed: I have personally reviewed following labs and imaging studies  CBC:  Recent Labs Lab 04/16/2017 1434 05/03/17 0209  WBC 20.7* 20.5*  NEUTROABS 17.6*  --   HGB 9.1* 9.3*  HCT 26.5* 28.2*  MCV 100.4* 101.1*  PLT 159 120*   Basic Metabolic Panel:  Recent Labs Lab 04/28/2017 1434 05/03/17 0209  NA 122* 123*  K 4.7 4.1  CL 90* 93*  CO2 21* 20*  GLUCOSE 78 103*  BUN 18 13  CREATININE  0.87 0.88  CALCIUM 8.7* 8.2*   GFR: Estimated Creatinine Clearance: 59.8 mL/min (by C-G formula based on SCr of 0.88 mg/dL). Liver Function Tests:  Recent Labs Lab 11-May-2017 1434 05/03/17 0209  AST 80* 78*  ALT 95* 83*  ALKPHOS 96 88  BILITOT 7.4* 7.0*  PROT 6.7 6.6  ALBUMIN 2.2* 2.0*   No results for input(s): LIPASE, AMYLASE in the last 168 hours.  Recent Labs Lab May 11, 2017 1448  AMMONIA 31   Coagulation Profile:  Recent Labs Lab 11-May-2017 1434 05/03/17 0209  INR 1.38 1.56   Cardiac Enzymes: No results for input(s): CKTOTAL, CKMB, CKMBINDEX, TROPONINI in the last 168 hours. BNP (last 3 results) No results for input(s): PROBNP in the last 8760 hours. HbA1C: No results for input(s): HGBA1C in the last 72 hours. CBG: No results for input(s): GLUCAP in the last 168 hours. Lipid Profile: No results for input(s): CHOL, HDL, LDLCALC, TRIG, CHOLHDL, LDLDIRECT in the last 72 hours. Thyroid Function  Tests: No results for input(s): TSH, T4TOTAL, FREET4, T3FREE, THYROIDAB in the last 72 hours. Anemia Panel: No results for input(s): VITAMINB12, FOLATE, FERRITIN, TIBC, IRON, RETICCTPCT in the last 72 hours. Sepsis Labs:  Recent Labs Lab 05/11/2017 1446 05-11-2017 1817 05-11-2017 2056  LATICACIDVEN 2.15* 1.5 2.4*    Recent Results (from the past 240 hour(s))  Blood culture (routine x 2)     Status: None (Preliminary result)   Collection Time: May 11, 2017  2:20 PM  Result Value Ref Range Status   Specimen Description BLOOD LEFT ANTECUBITAL  Final   Special Requests   Final    BOTTLES DRAWN AEROBIC AND ANAEROBIC Blood Culture adequate volume   Culture NO GROWTH 1 DAY  Final   Report Status PENDING  Incomplete  Blood culture (routine x 2)     Status: None (Preliminary result)   Collection Time: May 11, 2017  2:25 PM  Result Value Ref Range Status   Specimen Description BLOOD LEFT HAND  Final   Special Requests IN PEDIATRIC BOTTLE Blood Culture adequate volume  Final   Culture NO GROWTH 1 DAY  Final   Report Status PENDING  Incomplete  Respiratory Panel by PCR     Status: None   Collection Time: 05/11/17 11:29 PM  Result Value Ref Range Status   Adenovirus NOT DETECTED NOT DETECTED Final   Coronavirus 229E NOT DETECTED NOT DETECTED Final   Coronavirus HKU1 NOT DETECTED NOT DETECTED Final   Coronavirus NL63 NOT DETECTED NOT DETECTED Final   Coronavirus OC43 NOT DETECTED NOT DETECTED Final   Metapneumovirus NOT DETECTED NOT DETECTED Final   Rhinovirus / Enterovirus NOT DETECTED NOT DETECTED Final   Influenza A NOT DETECTED NOT DETECTED Final   Influenza B NOT DETECTED NOT DETECTED Final   Parainfluenza Virus 1 NOT DETECTED NOT DETECTED Final   Parainfluenza Virus 2 NOT DETECTED NOT DETECTED Final   Parainfluenza Virus 3 NOT DETECTED NOT DETECTED Final   Parainfluenza Virus 4 NOT DETECTED NOT DETECTED Final   Respiratory Syncytial Virus NOT DETECTED NOT DETECTED Final   Bordetella  pertussis NOT DETECTED NOT DETECTED Final   Chlamydophila pneumoniae NOT DETECTED NOT DETECTED Final   Mycoplasma pneumoniae NOT DETECTED NOT DETECTED Final  Body fluid culture     Status: None (Preliminary result)   Collection Time: 05/03/17  2:10 AM  Result Value Ref Range Status   Specimen Description PLEURAL RIGHT  Final   Special Requests NONE  Final   Gram Stain NO WBC SEEN NO ORGANISMS SEEN  Final   Culture PENDING  Incomplete   Report Status PENDING  Incomplete    Radiology Studies: Dg Chest 2 View  Result Date: 04/23/2017 CLINICAL DATA:  Pt just left the MD office and is being treated for liver problems and today lab work abnormal. Pt states wbc and heart racing, sob. Pt states no fever but felt achy all over and had fever at MD office. EXAM: CHEST  2 VIEW COMPARISON:  03/28/2017 FINDINGS: Heart size is within normal limits. There is moderate right pleural effusion associated with significant right basilar opacity, obscuring the hemidiaphragm. The right basilar opacity may indicate atelectasis and/or infiltrate. Right effusion is slightly less than prior study but remains significant. There is minimal left lower lobe subsegmental atelectasis of left lung is otherwise clear. No pulmonary edema. IMPRESSION: Significant right pleural effusion and right basilar opacity. Electronically Signed   By: Norva Pavlov M.D.   On: 04/15/2017 14:00   Dg Chest Port 1 View  Result Date: 05/03/2017 CLINICAL DATA:  Status post thoracentesis. EXAM: PORTABLE CHEST 1 VIEW COMPARISON:  Radiograph yesterday at 2202 hour FINDINGS: Decreased size of right pleural effusion from prior exam, moderate pleural fluid persists. No evidence of pneumothorax. Worsening left lung aeration with diffuse patchy opacities. Unchanged heart size and mediastinal contours allowing for differences in technique. IMPRESSION: 1. Decreased size of right pleural effusion post thoracentesis, moderate volume pleural fluid persists.  2. Worsening patchy opacities throughout the left lung, pulmonary edema versus multifocal pneumonia or combination thereof. Electronically Signed   By: Rubye Oaks M.D.   On: 05/03/2017 02:34   Dg Chest Port 1 View  Result Date: 04/12/2017 CLINICAL DATA:  Respiratory distress EXAM: PORTABLE CHEST 1 VIEW COMPARISON:  04/17/2017, 03/28/2017 FINDINGS: Moderate large right pleural effusion, increased compared to the previous radiograph. Dense right middle and lower lobe consolidation. Patchy left mid and lower lung infiltrates similar to slight increase. Partial obscuration of cardiomediastinal silhouette. No pneumothorax. IMPRESSION: 1. Moderate large right pleural effusion, increased compared to the radiograph performed earlier today 2. Increased left lung base and mid lung opacity suspicious for pulmonary infiltrates. No change in dense right middle lobe and lung base consolidation. 3. Increased vascular congestion and hazy perihilar opacity which may reflect mild edema Electronically Signed   By: Jasmine Pang M.D.   On: 05/05/2017 22:19   Scheduled Meds: . escitalopram  20 mg Oral Daily  . feeding supplement (ENSURE ENLIVE)  237 mL Oral BID BM  . ferrous sulfate  325 mg Oral BID WC  . furosemide  60 mg Oral Daily  . lactulose  10 g Oral Daily  . multivitamin with minerals  1 tablet Oral Daily  . pantoprazole  20 mg Oral QAC breakfast  . sodium chloride flush  3 mL Intravenous Q12H  . spironolactone  250 mg Oral Daily  . thiamine  100 mg Oral Daily   Continuous Infusions: . ceFEPime (MAXIPIME) IV Stopped (05/03/17 1430)  . vancomycin Stopped (05/03/17 1030)    LOS: 0 days    Merlene Laughter, DO [facility] Pager [phone]  If 7PM-7AM, please contact night-coverage [email or web address] Password [facility] 05/03/2017, 7:46 PM

## 2017-05-03 NOTE — Progress Notes (Signed)
Name: Rhonda Haley MRN: [id number] DOB: [birth year]    ADMISSION DATE:  May 17, 2017 CONSULTATION DATE:  17-May-2017  REFERRING MD :  Dr. Konrad Dolores   CHIEF COMPLAINT:  Hypoxia   HISTORY OF PRESENT ILLNESS:   51 year old female with female with PMH of ETOH cirrhosis with ascites, depression, right side hydrothorax (last thoracentesis on 8/12 yielding 2.6L), anemia, chronic hyponatremia, and protein calorie malnutrition     Presents to ED on 9/25 with tachycardia and hypotension. Upon arrival was noted to be hypoxic, CXR with increasing effusion and infiltrate at the right base. Admitted possible HCAP. Treated with cefepime and vancomycin. Later in the day developed increased work of breathing with RR 30-35 and diminished breath sounds. CXR with large right pleural effusion. PCCM asked to consult   SUBJECTIVE:  On 3L [place] Remains tachypneic and tachycardic Patient states she feels better since thoracentesis  VITAL SIGNS: Temp:  [98 F (36.7 C)-102.7 F (39.3 C)] 98.5 F (36.9 C) (09/26 1200) Pulse Rate:  [90-124] 90 (09/26 0800) Resp:  [22-41] 36 (09/26 0800) BP: (77-112)/(46-64) 90/49 (09/26 0800) SpO2:  [88 %-98 %] 96 % (09/26 0800) Weight:  [112 lb 14 oz (51.2 kg)-115 lb 1.3 oz (52.2 kg)] 112 lb 14 oz (51.2 kg) (09/26 0600)  PHYSICAL EXAMINATION: General:  Thin, chronically ill appearing female sitting in bed in mild distress HEENT: + sclerae icterus Neuro: Alert, oriented, non focal, tremor in hands CV:  rrr PULM: even/non-labored, tachypneic, lungs bilaterally clear, diminished in bases GI: round, soft, non-tender, bs active  Extremities: warm/dry, no edema    Recent Labs Lab May 17, 2017 1434 05/03/17 0209  NA 122* 123*  K 4.7 4.1  CL 90* 93*  CO2 21* 20*  BUN 18 13  CREATININE 0.87 0.88  GLUCOSE 78 103*    Recent Labs Lab 05-17-17 1434 05/03/17 0209  HGB 9.1* 9.3*  HCT 26.5* 28.2*  WBC 20.7* 20.5*  PLT 159 120*   Dg Chest 2 View  Result Date: May 17, 2017 CLINICAL  DATA:  Pt just left the MD office and is being treated for liver problems and today lab work abnormal. Pt states wbc and heart racing, sob. Pt states no fever but felt achy all over and had fever at MD office. EXAM: CHEST  2 VIEW COMPARISON:  03/28/2017 FINDINGS: Heart size is within normal limits. There is moderate right pleural effusion associated with significant right basilar opacity, obscuring the hemidiaphragm. The right basilar opacity may indicate atelectasis and/or infiltrate. Right effusion is slightly less than prior study but remains significant. There is minimal left lower lobe subsegmental atelectasis of left lung is otherwise clear. No pulmonary edema. IMPRESSION: Significant right pleural effusion and right basilar opacity. Electronically Signed   By: Norva Pavlov M.D.   On: 17-May-2017 14:00   Dg Chest Port 1 View  Result Date: 05/03/2017 CLINICAL DATA:  Status post thoracentesis. EXAM: PORTABLE CHEST 1 VIEW COMPARISON:  Radiograph yesterday at 2202 hour FINDINGS: Decreased size of right pleural effusion from prior exam, moderate pleural fluid persists. No evidence of pneumothorax. Worsening left lung aeration with diffuse patchy opacities. Unchanged heart size and mediastinal contours allowing for differences in technique. IMPRESSION: 1. Decreased size of right pleural effusion post thoracentesis, moderate volume pleural fluid persists. 2. Worsening patchy opacities throughout the left lung, pulmonary edema versus multifocal pneumonia or combination thereof. Electronically Signed   By: Rubye Oaks M.D.   On: 05/03/2017 02:34   Dg Chest Port 1 View  Result Date: 05-17-17 CLINICAL  DATA:  Respiratory distress EXAM: PORTABLE CHEST 1 VIEW COMPARISON:  04/14/2017, 03/28/2017 FINDINGS: Moderate large right pleural effusion, increased compared to the previous radiograph. Dense right middle and lower lobe consolidation. Patchy left mid and lower lung infiltrates similar to slight increase.  Partial obscuration of cardiomediastinal silhouette. No pneumothorax. IMPRESSION: 1. Moderate large right pleural effusion, increased compared to the radiograph performed earlier today 2. Increased left lung base and mid lung opacity suspicious for pulmonary infiltrates. No change in dense right middle lobe and lung base consolidation. 3. Increased vascular congestion and hazy perihilar opacity which may reflect mild edema Electronically Signed   By: Jasmine Pang M.D.   On: 04/21/2017 22:19   SIGNIFICANT EVENTS  9/25 > Admitted   STUDIES:  CXR 9/25 > Heart size is within normal limits. There is moderate right pleural effusion associated with significant right basilar opacity, obscuring the hemidiaphragm. The right basilar opacity may indicate atelectasis and/or infiltrate. Right effusion is slightly less than prior study but remains significant. There is minimal left lower lobe subsegmental atelectasis of left lung is otherwise clear. No pulmonary edema. CXR 9/25 > 1. Moderate large right pleural effusion, increased compared to the radiograph performed earlier today 2. Increased left lung base and mid lung opacity suspicious for pulmonary infiltrates. No change in dense right middle lobe and lung base consolidation. 3. Increased vascular congestion and hazy perihilar opacity which may reflect mild edema CXR 9/26 > . Decreased size of right pleural effusion post thoracentesis, moderate volume pleural fluid persists.  Worsening patchy opacities throughout the left lung, pulmonary edema versus multifocal pneumonia or combination thereof  ASSESSMENT / PLAN:  Acute Hypoxic Respiratory Failure in setting HCAP +Pleural effusion + ? Pulmonary edema - RVP neg, urine strep neg Plan  -Wean Supplemental Oxygen to Maintain Oxygen Saturation >92%  -Continue Cefepime and Vancomycin  -Follow Culture Data  - check urine legionella, consider adding atypical coverage  Recurrent Right Side Hydrothorax  in setting of Alcoholic Cirrhosis  - s/p right thora 9/26 w/1200 ml clear fluid - CXR w/residual mod right effusion, no PTX  - transudative effusion by Lights criteria (LDH ratio 0.2) Plan - Follow Culture and Cytology studies - CXR as needed  - continue tx of cirrhosis per primary team   Rest per primary team   Posey Boyer, AGACNP-BC [facility] Pgr: [phone] or if no answer [phone] 05/03/2017, 2:25 PM

## 2017-05-04 ENCOUNTER — Inpatient Hospital Stay (HOSPITAL_COMMUNITY): Payer: 59

## 2017-05-04 DIAGNOSIS — R509 Fever, unspecified: Secondary | ICD-10-CM

## 2017-05-04 DIAGNOSIS — R0602 Shortness of breath: Secondary | ICD-10-CM

## 2017-05-04 DIAGNOSIS — Z9889 Other specified postprocedural states: Secondary | ICD-10-CM

## 2017-05-04 DIAGNOSIS — K769 Liver disease, unspecified: Secondary | ICD-10-CM

## 2017-05-04 DIAGNOSIS — D72829 Elevated white blood cell count, unspecified: Secondary | ICD-10-CM

## 2017-05-04 LAB — CBC WITH DIFFERENTIAL/PLATELET
BASOS PCT: 0 %
Basophils Absolute: 0 10*3/uL (ref 0.0–0.1)
EOS ABS: 0 10*3/uL (ref 0.0–0.7)
Eosinophils Relative: 0 %
HCT: 30.9 % — ABNORMAL LOW (ref 36.0–46.0)
HEMOGLOBIN: 10.4 g/dL — AB (ref 12.0–15.0)
Lymphocytes Relative: 7 %
Lymphs Abs: 1.5 10*3/uL (ref 0.7–4.0)
MCH: 33.8 pg (ref 26.0–34.0)
MCHC: 33.7 g/dL (ref 30.0–36.0)
MCV: 100.3 fL — ABNORMAL HIGH (ref 78.0–100.0)
MONOS PCT: 6 %
Monocytes Absolute: 1.3 10*3/uL — ABNORMAL HIGH (ref 0.1–1.0)
NEUTROS PCT: 87 %
Neutro Abs: 19 10*3/uL — ABNORMAL HIGH (ref 1.7–7.7)
Platelets: UNDETERMINED 10*3/uL (ref 150–400)
RBC: 3.08 MIL/uL — ABNORMAL LOW (ref 3.87–5.11)
RDW: 15.3 % (ref 11.5–15.5)
WBC: 21.8 10*3/uL — ABNORMAL HIGH (ref 4.0–10.5)

## 2017-05-04 LAB — PH, BODY FLUID: PH, BODY FLUID: 8.1

## 2017-05-04 LAB — URINE CULTURE

## 2017-05-04 LAB — LEGIONELLA PNEUMOPHILA SEROGP 1 UR AG: L. pneumophila Serogp 1 Ur Ag: NEGATIVE

## 2017-05-04 LAB — COMPREHENSIVE METABOLIC PANEL
ALBUMIN: 2 g/dL — AB (ref 3.5–5.0)
ALK PHOS: 89 U/L (ref 38–126)
ALT: 78 U/L — ABNORMAL HIGH (ref 14–54)
ANION GAP: 13 (ref 5–15)
AST: 81 U/L — ABNORMAL HIGH (ref 15–41)
BUN: 17 mg/dL (ref 6–20)
CO2: 17 mmol/L — AB (ref 22–32)
Calcium: 8.4 mg/dL — ABNORMAL LOW (ref 8.9–10.3)
Chloride: 94 mmol/L — ABNORMAL LOW (ref 101–111)
Creatinine, Ser: 0.91 mg/dL (ref 0.44–1.00)
GFR calc Af Amer: >60 mL/min (ref >60)
GFR calc non Af Amer: >60 mL/min (ref >60)
GLUCOSE: 87 mg/dL (ref 65–99)
Potassium: 4.7 mmol/L (ref 3.5–5.1)
SODIUM: 124 mmol/L — AB (ref 135–145)
Total Bilirubin: 9.6 mg/dL — ABNORMAL HIGH (ref 0.3–1.2)
Total Protein: 6.7 g/dL (ref 6.5–8.1)

## 2017-05-04 LAB — ECHOCARDIOGRAM COMPLETE
Height: 68 in
Weight: 1865.97 oz

## 2017-05-04 LAB — PROTIME-INR
INR: 1.68
Prothrombin Time: 19.6 seconds — ABNORMAL HIGH (ref 11.4–15.2)

## 2017-05-04 LAB — AMMONIA: Ammonia: 43 umol/L — ABNORMAL HIGH (ref 9–35)

## 2017-05-04 LAB — PROCALCITONIN: Procalcitonin: 0.88 ng/mL

## 2017-05-04 LAB — PHOSPHORUS: Phosphorus: 4.5 mg/dL (ref 2.5–4.6)

## 2017-05-04 LAB — MAGNESIUM: Magnesium: 1.8 mg/dL (ref 1.7–2.4)

## 2017-05-04 MED ORDER — METHYLPREDNISOLONE SODIUM SUCC 40 MG IJ SOLR
40.0000 mg | Freq: Two times a day (BID) | INTRAMUSCULAR | Status: DC
  Administered 2017-05-04 – 2017-05-08 (×8): 40 mg via INTRAVENOUS
  Filled 2017-05-04 (×9): qty 1

## 2017-05-04 MED ORDER — ORAL CARE MOUTH RINSE
15.0000 mL | Freq: Two times a day (BID) | OROMUCOSAL | Status: DC
  Administered 2017-05-04 – 2017-05-17 (×24): 15 mL via OROMUCOSAL

## 2017-05-04 NOTE — Progress Notes (Signed)
Daily Rounding Note  05/04/2017, 9:28 AM  LOS: 1 day   SUBJECTIVE:   Chief complaint:     Feels fatigued and weak.  Appetite better.  Several BMs yesterday a few hours after lactulose.  Had been several days since BM.  One BM this AM.   SOB with minor exertion, sats in low 90s  OBJECTIVE:         Vital signs in last 24 hours:    Temp:  [98.3 F (36.8 C)-99.7 F (37.6 C)] 98.3 F (36.8 C) (09/27 0800) Pulse Rate:  [119-138] 125 (09/27 0800) Resp:  [22-47] 47 (09/27 0800) BP: (95-124)/(52-63) 95/52 (09/27 0800) SpO2:  [91 %-95 %] 92 % (09/27 0800) Weight:  [52.9 kg (116 lb 10 oz)] 52.9 kg (116 lb 10 oz) (09/27 0530) Last BM Date: 04/18/2017 Filed Weights   05/03/17 0048 05/03/17 0600 05/04/17 0530  Weight: 52.2 kg (115 lb 1.3 oz) 51.2 kg (112 lb 14 oz) 52.9 kg (116 lb 10 oz)   General: jaundiced, thin.  Looks unwell.  Weak. Fully awake and alert.  Heart: Tachy in 120s.  Regular Chest: diminished BS on right, crackles on left base.  Dyspneic.  No cough Abdomen: soft, NT, ND.  No flank bulge.    Extremities: no CCE.  Muscles wasted in limbs.   Neuro/Psych:  Pleasant, cooperative, tremor in trunk and limbs.  No asterixis.  Fully oriented and no obvious impairment.    Intake/Output from previous day: 09/26 0701 - 09/27 0700 In: 1170 [P.O.:720; IV Piggyback:450] Out: 500 [Urine:500]  Intake/Output this shift: Total I/O In: 50 [IV Piggyback:50] Out: 700 [Urine:700]  Lab Results:  Recent Labs  04/23/2017 1434 05/03/17 0209 05/04/17 0811  WBC 20.7* 20.5* 21.8*  HGB 9.1* 9.3* 10.4*  HCT 26.5* 28.2* 30.9*  PLT 159 120* PENDING   BMET  Recent Labs  05/03/2017 1434 05/03/17 0209 05/04/17 0811  NA 122* 123* 124*  K 4.7 4.1 4.7  CL 90* 93* 94*  CO2 21* 20* 17*  GLUCOSE 78 103* 87  BUN CREATININE 0.87 0.88 0.91  CALCIUM 8.7* 8.2* 8.4*   LFT  Recent Labs  04/09/2017 1434 05/03/17 0209  05/04/17 0811  PROT 6.7 6.6 6.7  ALBUMIN 2.2* 2.0* 2.0*  AST 80* 78* 81*  ALT 95* 83* 78*  ALKPHOS 96 88 89  BILITOT 7.4* 7.0* 9.6*   PT/INR  Recent Labs  05/03/17 0209 05/04/17 0811  LABPROT 18.6* 19.6*  INR 1.56 1.68   Hepatitis Panel No results for input(s): HEPBSAG, HCVAB, HEPAIGM, HEPBIGM in the last 72 hours.  Studies/Results: Dg Chest 2 View  Result Date: 05/01/2017 CLINICAL DATA:  Pt just left the MD office and is being treated for liver problems and today lab work abnormal. Pt states wbc and heart racing, sob. Pt states no fever but felt achy all over and had fever at MD office. EXAM: CHEST  2 VIEW COMPARISON:  03/28/2017 FINDINGS: Heart size is within normal limits. There is moderate right pleural effusion associated with significant right basilar opacity, obscuring the hemidiaphragm. The right basilar opacity may indicate atelectasis and/or infiltrate. Right effusion is slightly less than prior study but remains significant. There is minimal left lower lobe subsegmental atelectasis of left lung is otherwise clear. No pulmonary edema. IMPRESSION: Significant right pleural effusion and right basilar opacity. Electronically Signed   By: Norva Pavlov M.D.   On: 04/17/2017 14:00   Dg Chest  Port 1 View  Result Date: 05/03/2017 CLINICAL DATA:  Status post thoracentesis. EXAM: PORTABLE CHEST 1 VIEW COMPARISON:  Radiograph yesterday at 2202 hour FINDINGS: Decreased size of right pleural effusion from prior exam, moderate pleural fluid persists. No evidence of pneumothorax. Worsening left lung aeration with diffuse patchy opacities. Unchanged heart size and mediastinal contours allowing for differences in technique. IMPRESSION: 1. Decreased size of right pleural effusion post thoracentesis, moderate volume pleural fluid persists. 2. Worsening patchy opacities throughout the left lung, pulmonary edema versus multifocal pneumonia or combination thereof. Electronically Signed   By:  Rubye Oaks M.D.   On: 05/03/2017 02:34   Dg Chest Port 1 View  Result Date: 05/01/2017 CLINICAL DATA:  Respiratory distress EXAM: PORTABLE CHEST 1 VIEW COMPARISON:  04/27/2017, 03/28/2017 FINDINGS: Moderate large right pleural effusion, increased compared to the previous radiograph. Dense right middle and lower lobe consolidation. Patchy left mid and lower lung infiltrates similar to slight increase. Partial obscuration of cardiomediastinal silhouette. No pneumothorax. IMPRESSION: 1. Moderate large right pleural effusion, increased compared to the radiograph performed earlier today 2. Increased left lung base and mid lung opacity suspicious for pulmonary infiltrates. No change in dense right middle lobe and lung base consolidation. 3. Increased vascular congestion and hazy perihilar opacity which may reflect mild edema Electronically Signed   By: Jasmine Pang M.D.   On: 04/19/2017 22:19   Scheduled Meds: . escitalopram  20 mg Oral Daily  . feeding supplement (ENSURE ENLIVE)  237 mL Oral BID BM  . ferrous sulfate  325 mg Oral BID WC  . furosemide  60 mg Oral Daily  . lactulose  10 g Oral Daily  . mouth rinse  15 mL Mouth Rinse BID  . multivitamin with minerals  1 tablet Oral Daily  . pantoprazole  20 mg Oral QAC breakfast  . sodium chloride flush  3 mL Intravenous Q12H  . spironolactone  250 mg Oral Daily  . thiamine  100 mg Oral Daily   Continuous Infusions: . ceFEPime (MAXIPIME) IV Stopped (05/04/17 0606)  . vancomycin Stopped (05/04/17 0008)   PRN Meds:.ondansetron **OR** ondansetron (ZOFRAN) IV   ASSESMENT:   *  Cirrhosis.  ETOH and weakly + autoimmune markers.  ETOH level abnormal at <10, normal is <5.   Started Prednisolone 8/10. Had been in midst of 16 day Prednisolone taper, started 9/19- planned last dose 10/2.  Discriminant fx score 30.  Corticosteroids of benefit with score >/= to 32.   No prednisolone currently in place, Dr Marland Mcalpine prefers not continuing in setting of  sepsis.  *  Transudative right pleural effusion. 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   S/p 1.2 liter thoracentesis 9/26.  Fluid WBCs 24.  No organisms or WBCs on the gram stain.  Worsening left lung opacities , ?HCAP/CAP vs CHF?Marland Kitchen  Hypoxic.  Day 3 abx.  Vanc, Maxipime.  Aldactone 250 mg, Lasix 60 mg daily.  Renal function not compromised.   Effusion stable on this morning's cxr c/w post thora images yesterday.  Left side still worrisome for PNA vs pulmonary edema.     *  Sepsis.  Suspicion was pulmonary source, left sided given there is no evidence of infected right  hydrothorax.    *  Hyponatremia. Chronic.  2 gm Na diet in place.   *  Macrocytic anemia.  Overall improved from previous measures in last several months.  On bid po Iron.     *  Thrombocytopenia.  Non-critical.  Hx of same in last 2 months.   *  Protein calorie malnutrition, weight loss.  Severe.  RD consulted on 9/26.  Ensure BID in place.    *  Subtle encephalopathy.  Low dose lactulose started 9/26.  Ammonia 31 >> 45.     PLAN   *  Continue current care.      Jennye Moccasin  05/04/2017, 9:28 AM Pager: [phone]

## 2017-05-04 NOTE — Progress Notes (Signed)
PROGRESS NOTE    Dunya Meiners  ZOX:[id number] DOB: 1966-01-26 DOA: 04/15/2017 PCP: Lewis Moccasin, MD   Brief Narrative: Naydeline Morace is a 51 y.o. female with medical history significant for alcoholic cirrhosis with associated ascites, recurrent right hepatic hydrothorax, chronic hyponatremia, protein calorie malnutrition and anemia. Patient had been started on low-dose prednisone (03/21/17) for acute alcoholic hepatitis in the setting of weakly positive autoimmune markers. Over the past 2 weeks prednisone has been tapered to off with last dosage on 9/24. Patient has had asymptomatic leukocytosis dating back to 8/21 where her white count was documented at 23,300 with repeat CBC on 9/12 white count 29,900. On the morning of admission patient awakened with generalized malaise, weakness, chills and subjective fevers. She was not having any abdominal pain, nausea vomiting or diarrhea. She is not had any coughing or sick contacts. She followed up with the GI office she was noted to have a low-grade temperature of 100.4, heart rate was 140, BP was 90/56. Because of the symptoms she was sent to the ER for further evaluation.   In the ER she continued with low-grade temperature and tachycardia as well as tachypnea with a low but normal blood pressure readings. Her white count was 20,700 with left shift as previous. Her lactic acid was 2.15. Her sodium was 122. Abdominal exam was benign. Chest x-ray revealed significant right pleural effusion and right basilar opacity. EDP is concerned over possible HCAP and requests evaluation for observation admission. She is not hypoxic at rest. She does report dyspnea on exertion which is chronic. She subsequently underwent emergent Thoracentesis and was transferred to SDU. She is improved and GI was consulted for further evaluation and Recc's. PCCM following for her Respiratory Failure. Patient was started back on Diuretics last night. Felt a little SOB this AM and repeat CXR  did not show any worsening of Pleural Effusion. Started Patient on IV Steroids with Solumedrol 40 mg q12h.   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   Pleural effusion on right 2/2 hepatic hydrothorax   Anemia   Protein calorie malnutrition (HCC)   Chronic hyponatremia   HCAP (healthcare-associated pneumonia)   Murmur, cardiac   Acute respiratory distress   Pleural effusion associated with hepatic disorder  Sepsis 2/2 presumed HCAP (healthcare-associated pneumonia) -Presents with less than 24 hours of fevers, chills, generalized weakness tachycardia and tachypnea with relative hypotension -Persistent Leukocytosis since 8/21 as patient has been on Steroids and WBC now 20.5  -Mild elevation in serum lactate and went from 2.15 -> 1.5 -> 2.4 -Chest x-ray concerning for right basilar opacity in setting of recurrent right hepatic hydrothorax -Empiric cefepime/vancomycin IV -Follow up on blood cultures; Urine Cx shows multiple species present  -Obtain sputum culture -Urinary Legionella and Strep Pneumo Urine Ag Negative  -Cycled lactic acid and went to 2.4 -Procalcitonin 0.88 -Respiratory viral panel and influenza PCR Negative  -NS at 125/hr Stopped as patient became symptomatic and had yohave Thoracentesis  -Repeat CXR this AM showed Large right pleural effusion stable since yesterday's post thoracentesis radiograph. Diffuse alveolar opacities in the left lung are worrisome for pneumonia or pulmonary edema -Started IV Solumedrol 40 q12h  Acute Hypoxic Respiratory Failure from HCAP and Pleural  effusion on right 2/2 hepatic hydrothorax s/p Emergent Thoracentesis  -C/w Supplemental O2 and wean as  tolerated -Follow Cx Data -As Above  Pleural effusion on right 2/2 hepatic hydrothorax s/p Emergent Thoracentesis  -Underwent right thoracentesis on 8/12 with 2.6 L of clear yellow fluid removed and underwent Thoracentesis on 9/26 and had 1.2 Liters removed. IVF stopped and given IV Lasix 40 mg Once -Current chest x-ray shows reaccumulation of hydrothorax but not as significant as prior to thoracentesis -Patient reports chronic dyspnea on exertion therefore given recurrence of hydrothorax will obtain ambulatory oximetry in the event patient may benefit from home O2 in AM -PCCM Consulted and following and appreciate Recc's; Recommending continuing Abx and perform further Thoracentesis if Significant Dyspnena  -Pleural Effusion was Transudative    Decompensated 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 -Briefly seen at the GI office today-they will see patient in a.m. -Has been on Lasix and spironolactone with recent dosage adjustments -AST was 81 (78) and ALT was 78 (83); Recently had EtOH  -At time of discharge dry weight documented at 122 lbs with current weight 114 lbs -Patient may be somewhat volume depleted and this could be contributing to her symptoms -Completed prednisone taper on 9/24 but will likely need to restart Prednisone  -Minimal ascites and no abdominal pain so do not think presenting symptoms consistent with SBP (Canceled IR paracentesis previously ordered but made patient NPO after midnight in the event of the procedure such as thoracentesis indicated) -Gastroenterology consulted and recommending continuing Abx and resume Regular Diet with Protein Supplements -GI recommending resuming Prednisolone Taper (Will discuss with Dr. Adela Lank as IV Solumedrol 40 mg started q12h) and resuming Diuretics for the treatment of Hydrothorax/Ascites; restarted Home Lasix and Spironolactone -C/w Low Sodium Diet and start Lactulose 10 grams po Daily   -C/w MVI,  Folic Acid and Thiamine  -ECHO showed EF of 70-75% with Grade 1 DD  Chronic Hyponatremia -Baseline appears to be around 124-125 -Current sodium 124and likely from Ascites -Follow labs and repeat CMP in AM   Murmur, cardiac -Has loud systolic murmur which was not documented during previous admission -Echocardiogram from July with normal systolic function and normal diastolic function without valvular abnormalities -Give the new murmur and fevers will obtain echocardiogram-need to follow-up on blood cultures as well -Showed EF of 60-65%  Anemia of Chronic Disease and IDA -Hemoglobin stable and at baseline -Hb/Hct went from 9.3/28.2 -> 10.4/30.9 -C/w Ferrous Sulfate 325 mg po BID  -Continue to Monitor for S/Sx of Bleeding  Protein Calorie Malnutrition  -Continue preadmission Ensure -Nutritionist Consulted for futher reccs  Thrombocytopenia -Likely from EtOH  -Unable to estimate today as Platelet Clumped  -Continue to Monitor closely and repeat CBC in AM   Hyperbilirubinemia -Likely from EtOHism and Decompensated Liver Cirrhosis with Ascites -Bilirubin this AM was 9.6 -Continue to Monitor and repeat CMP in AM   Aortic Valve Thickening -ECHO showed The non-coronary cusp of the aortic valve seems thickened and reduncdant. There is mild ootflow obstuction with this.  -No AI.  -Would consider TEE to further evaluate but will discuss with Cardiology   Leukocytosis -Likely Reactive from Steroid Demargination -Patient's WBC was 21.8 -Continue to Treat Pneumonia -Started Patient on IV Solumedrol so will need to watch CBC closely -Repeat CBC with Diff in AM  DVT prophylaxis: SCDs given Anemia and Thrombocytopenia  Code Status: FULL CODE Family Communication: No Family present at bedside  Disposition Plan: Remain in SDU  Consultants:   PCCM  Gastroenterology   Procedures: Thoracentesis with removal of 1.2 Liters of Fluid   ECHOCARDIOGRAM Study Conclusions  - Left  ventricle: The cavity size was normal. Wall thickness was   normal. Systolic function was hyperdynamic. The estimated   ejection fraction was in the range of 70% to 75%. Wall motion was   normal; there were no regional wall motion abnormalities. Doppler   parameters are consistent with abnormal left ventricular   relaxation (grade 1 diastolic dysfunction). - Aortic valve: The non-coronary cusp of the aortic valve seems   thickened and reduncdant. There is mild ootflow obstuction with   this. No AI. Would consider TEE to further evalaute. Valve area   (VTI): 1.72 cm^2. Valve area (Vmax): 1.78 cm^2. Valve area   (Vmean): 1.82 cm^2. - Mitral valve: Valve area by pressure half-time: 2.18 cm^2.  Antimicrobials: Anti-infectives    Start     Dose/Rate Route Frequency Ordered Stop   2017-05-27 2200  ceFEPIme (MAXIPIME) 1 g in dextrose 5 % 50 mL IVPB     1 g 100 mL/hr over 30 Minutes Intravenous Every 8 hours 05/27/17 2055 05/10/17 2159   2017/05/27 2130  vancomycin (VANCOCIN) IVPB 750 mg/150 ml premix     750 mg 150 mL/hr over 60 Minutes Intravenous Every 12 hours 05/27/2017 2102     05/27/2017 1615  vancomycin (VANCOCIN) IVPB 1000 mg/200 mL premix     1,000 mg 200 mL/hr over 60 Minutes Intravenous  Once 2017/05/27 1614 05/27/2017 1736   2017-05-27 1615  piperacillin-tazobactam (ZOSYN) IVPB 3.375 g     3.375 g 100 mL/hr over 30 Minutes Intravenous  Once 05/27/17 1614 05-27-2017 1758     Subjective: Seen and examined at bedside and felt a little SOB. No N/V. Denied any CP.   Objective: Vitals:   05/04/17 0200 05/04/17 0530 05/04/17 0800 05/04/17 1200  BP: 98/62  (!) 95/52 (!) 91/59  Pulse: (!) 126  (!) 125 (!) 112  Resp: (!) 43  (!) 47 (!) 40  Temp:   98.3 F (36.8 C) 97.9 F (36.6 C)  TempSrc:   Oral Oral  SpO2: 91%  92%   Weight:  52.9 kg (116 lb 10 oz)    Height:        Intake/Output Summary (Last 24 hours) at 05/04/17 1305 Last data filed at 05/04/17 1100  Gross per 24 hour  Intake               590 ml  Output             1250 ml  Net             -660 ml   Filed Weights   05/03/17 0048 05/03/17 0600 05/04/17 0530  Weight: 52.2 kg (115 lb 1.3 oz) 51.2 kg (112 lb 14 oz) 52.9 kg (116 lb 10 oz)   Examination: Physical Exam:  Constitutional: Chronically ill appearing Jaundiced female in NAD Eyes: Sclerae are icteric. Lids normal ENMT: External Ears and nose appear normal. MMM Neck: Supple with no JVD Respiratory: Diminished with coarse breath sounds and crackles in Right lower Base. No accessory muscle use and normal respiratory effort Cardiovascular: RRR; Loud 3/6 Systolic Murmur Abdomen: Soft, NT, ND. Bowel sounds present GU: Deferred Musculoskeletal: No contractures. No Cyanosis Skin: Warm and dry. No rashes or lesions appreciated Neurologic: CN 2-12 grossly intact with no appreciable focal deficits Psychiatric: Pleasant mood and affect. Awake and alert   Data Reviewed: I have personally reviewed following labs and imaging  studies  CBC:  Recent Labs Lab May 30, 2017 1434 05/03/17 0209 05/04/17 0811  WBC 20.7* 20.5* 21.8*  NEUTROABS 17.6*  --  19.0*  HGB 9.1* 9.3* 10.4*  HCT 26.5* 28.2* 30.9*  MCV 100.4* 101.1* 100.3*  PLT 159 120* PLATELET CLUMPS NOTED ON SMEAR, UNABLE TO ESTIMATE   Basic Metabolic Panel:  Recent Labs Lab 05/30/2017 1434 05/03/17 0209 05/04/17 0811  NA 122* 123* 124*  K 4.7 4.1 4.7  CL 90* 93* 94*  CO2 21* 20* 17*  GLUCOSE 78 103* 87  BUN CREATININE 0.87 0.88 0.91  CALCIUM 8.7* 8.2* 8.4*  MG  --   --  1.8  PHOS  --   --  4.5   GFR: Estimated Creatinine Clearance: 59.7 mL/min (by C-G formula based on SCr of 0.91 mg/dL). Liver Function Tests:  Recent Labs Lab 05/30/2017 1434 05/03/17 0209 05/04/17 0811  AST 80* 78* 81*  ALT 95* 83* 78*  ALKPHOS 96 88 89  BILITOT 7.4* 7.0* 9.6*  PROT 6.7 6.6 6.7  ALBUMIN 2.2* 2.0* 2.0*   No results for input(s): LIPASE, AMYLASE in the last 168 hours.  Recent Labs Lab  2017/05/30 1448 05/04/17 0811  AMMONIA 31 43*   Coagulation Profile:  Recent Labs Lab May 30, 2017 1434 05/03/17 0209 05/04/17 0811  INR 1.38 1.56 1.68   Cardiac Enzymes: No results for input(s): CKTOTAL, CKMB, CKMBINDEX, TROPONINI in the last 168 hours. BNP (last 3 results) No results for input(s): PROBNP in the last 8760 hours. HbA1C: No results for input(s): HGBA1C in the last 72 hours. CBG: No results for input(s): GLUCAP in the last 168 hours. Lipid Profile: No results for input(s): CHOL, HDL, LDLCALC, TRIG, CHOLHDL, LDLDIRECT in the last 72 hours. Thyroid Function Tests: No results for input(s): TSH, T4TOTAL, FREET4, T3FREE, THYROIDAB in the last 72 hours. Anemia Panel: No results for input(s): VITAMINB12, FOLATE, FERRITIN, TIBC, IRON, RETICCTPCT in the last 72 hours. Sepsis Labs:  Recent Labs Lab May 30, 2017 1446 30-May-2017 1817 05-30-17 2056  LATICACIDVEN 2.15* 1.5 2.4*    Recent Results (from the past 240 hour(s))  Blood culture (routine x 2)     Status: None (Preliminary result)   Collection Time: 2017-05-30  2:20 PM  Result Value Ref Range Status   Specimen Description BLOOD LEFT ANTECUBITAL  Final   Special Requests   Final    BOTTLES DRAWN AEROBIC AND ANAEROBIC Blood Culture adequate volume   Culture NO GROWTH 1 DAY  Final   Report Status PENDING  Incomplete  Blood culture (routine x 2)     Status: None (Preliminary result)   Collection Time: 30-May-2017  2:25 PM  Result Value Ref Range Status   Specimen Description BLOOD LEFT HAND  Final   Special Requests IN PEDIATRIC BOTTLE Blood Culture adequate volume  Final   Culture NO GROWTH 1 DAY  Final   Report Status PENDING  Incomplete  Urine culture     Status: Abnormal   Collection Time: 05-30-17  2:55 PM  Result Value Ref Range Status   Specimen Description URINE, CLEAN CATCH  Final   Special Requests NONE  Final   Culture MULTIPLE SPECIES PRESENT, SUGGEST RECOLLECTION (A)  Final   Report Status 05/04/2017 FINAL   Final  Respiratory Panel by PCR     Status: None   Collection Time: 30-May-2017 11:29 PM  Result Value Ref Range Status   Adenovirus NOT DETECTED NOT DETECTED Final   Coronavirus 229E NOT DETECTED NOT DETECTED Final  Coronavirus HKU1 NOT DETECTED NOT DETECTED Final   Coronavirus NL63 NOT DETECTED NOT DETECTED Final   Coronavirus OC43 NOT DETECTED NOT DETECTED Final   Metapneumovirus NOT DETECTED NOT DETECTED Final   Rhinovirus / Enterovirus NOT DETECTED NOT DETECTED Final   Influenza A NOT DETECTED NOT DETECTED Final   Influenza B NOT DETECTED NOT DETECTED Final   Parainfluenza Virus 1 NOT DETECTED NOT DETECTED Final   Parainfluenza Virus 2 NOT DETECTED NOT DETECTED Final   Parainfluenza Virus 3 NOT DETECTED NOT DETECTED Final   Parainfluenza Virus 4 NOT DETECTED NOT DETECTED Final   Respiratory Syncytial Virus NOT DETECTED NOT DETECTED Final   Bordetella pertussis NOT DETECTED NOT DETECTED Final   Chlamydophila pneumoniae NOT DETECTED NOT DETECTED Final   Mycoplasma pneumoniae NOT DETECTED NOT DETECTED Final  Body fluid culture     Status: None (Preliminary result)   Collection Time: 05/03/17  2:10 AM  Result Value Ref Range Status   Specimen Description PLEURAL RIGHT  Final   Special Requests NONE  Final   Gram Stain NO WBC SEEN NO ORGANISMS SEEN   Final   Culture NO GROWTH 1 DAY  Final   Report Status PENDING  Incomplete    Radiology Studies: Dg Chest 2 View  Result Date: 2017/05/07 CLINICAL DATA:  Pt just left the MD office and is being treated for liver problems and today lab work abnormal. Pt states wbc and heart racing, sob. Pt states no fever but felt achy all over and had fever at MD office. EXAM: CHEST  2 VIEW COMPARISON:  03/28/2017 FINDINGS: Heart size is within normal limits. There is moderate right pleural effusion associated with significant right basilar opacity, obscuring the hemidiaphragm. The right basilar opacity may indicate atelectasis and/or infiltrate. Right  effusion is slightly less than prior study but remains significant. There is minimal left lower lobe subsegmental atelectasis of left lung is otherwise clear. No pulmonary edema. IMPRESSION: Significant right pleural effusion and right basilar opacity. Electronically Signed   By: Norva Pavlov M.D.   On: 2017-05-07 14:00   Dg Chest Port 1 View  Result Date: 05/04/2017 CLINICAL DATA:  Shortness of breath. History of cirrhosis, protein calorie malnutrition, current smoker. EXAM: PORTABLE CHEST 1 VIEW COMPARISON:  Portable chest x-ray of May 03, 2017 FINDINGS: On the right there is a large E fusion. The aerated portion of the right lung is grossly clear. On the left patchy alveolar opacities persist throughout the long. No left pleural effusion is observed. The cardiac silhouette is obscured. The pulmonary vascularity is indistinct. IMPRESSION: Large right pleural effusion stable since yesterday's post thoracentesis radiograph. Diffuse alveolar opacities in the left lung are worrisome for pneumonia or pulmonary edema. Electronically Signed   By: David  [place] M.D.   On: 05/04/2017 09:36   Dg Chest Port 1 View  Result Date: 05/03/2017 CLINICAL DATA:  Status post thoracentesis. EXAM: PORTABLE CHEST 1 VIEW COMPARISON:  Radiograph yesterday at 2202 hour FINDINGS: Decreased size of right pleural effusion from prior exam, moderate pleural fluid persists. No evidence of pneumothorax. Worsening left lung aeration with diffuse patchy opacities. Unchanged heart size and mediastinal contours allowing for differences in technique. IMPRESSION: 1. Decreased size of right pleural effusion post thoracentesis, moderate volume pleural fluid persists. 2. Worsening patchy opacities throughout the left lung, pulmonary edema versus multifocal pneumonia or combination thereof. Electronically Signed   By: Rubye Oaks M.D.   On: 05/03/2017 02:34   Dg Chest Port 1 View  Result Date:  05/04/2017 CLINICAL DATA:   Respiratory distress EXAM: PORTABLE CHEST 1 VIEW COMPARISON:  04/20/2017, 03/28/2017 FINDINGS: Moderate large right pleural effusion, increased compared to the previous radiograph. Dense right middle and lower lobe consolidation. Patchy left mid and lower lung infiltrates similar to slight increase. Partial obscuration of cardiomediastinal silhouette. No pneumothorax. IMPRESSION: 1. Moderate large right pleural effusion, increased compared to the radiograph performed earlier today 2. Increased left lung base and mid lung opacity suspicious for pulmonary infiltrates. No change in dense right middle lobe and lung base consolidation. 3. Increased vascular congestion and hazy perihilar opacity which may reflect mild edema Electronically Signed   By: Jasmine Pang M.D.   On: 04/24/2017 22:19   Scheduled Meds: . escitalopram  20 mg Oral Daily  . feeding supplement (ENSURE ENLIVE)  237 mL Oral BID BM  . ferrous sulfate  325 mg Oral BID WC  . furosemide  60 mg Oral Daily  . lactulose  10 g Oral Daily  . mouth rinse  15 mL Mouth Rinse BID  . multivitamin with minerals  1 tablet Oral Daily  . pantoprazole  20 mg Oral QAC breakfast  . sodium chloride flush  3 mL Intravenous Q12H  . spironolactone  250 mg Oral Daily  . thiamine  100 mg Oral Daily   Continuous Infusions: . ceFEPime (MAXIPIME) IV Stopped (05/04/17 0606)  . vancomycin Stopped (05/04/17 1207)    LOS: 1 day    Merlene Laughter, DO [facility] Pager [phone]  If 7PM-7AM, please contact night-coverage [email or web address] Password [email or web address] 05/04/2017, 1:05 PM

## 2017-05-04 NOTE — Progress Notes (Signed)
  Echocardiogram 2D Echocardiogram has been performed.  Rhonda Haley L Androw 05/04/2017, 4:05 PM

## 2017-05-05 ENCOUNTER — Inpatient Hospital Stay (HOSPITAL_COMMUNITY): Payer: 59

## 2017-05-05 DIAGNOSIS — R0600 Dyspnea, unspecified: Secondary | ICD-10-CM

## 2017-05-05 LAB — COMPREHENSIVE METABOLIC PANEL
ALBUMIN: 1.8 g/dL — AB (ref 3.5–5.0)
ALT: 67 U/L — AB (ref 14–54)
AST: 62 U/L — AB (ref 15–41)
Alkaline Phosphatase: 102 U/L (ref 38–126)
Anion gap: 6 (ref 5–15)
BUN: 25 mg/dL — ABNORMAL HIGH (ref 6–20)
CHLORIDE: 92 mmol/L — AB (ref 101–111)
CO2: 23 mmol/L (ref 22–32)
Calcium: 8.6 mg/dL — ABNORMAL LOW (ref 8.9–10.3)
Creatinine, Ser: 1.01 mg/dL — ABNORMAL HIGH (ref 0.44–1.00)
GFR calc Af Amer: >60 mL/min (ref >60)
Glucose, Bld: 215 mg/dL — ABNORMAL HIGH (ref 65–99)
Potassium: 4.7 mmol/L (ref 3.5–5.1)
Sodium: 121 mmol/L — ABNORMAL LOW (ref 135–145)
TOTAL PROTEIN: 6.3 g/dL — AB (ref 6.5–8.1)
Total Bilirubin: 8 mg/dL — ABNORMAL HIGH (ref 0.3–1.2)

## 2017-05-05 LAB — CBC WITH DIFFERENTIAL/PLATELET
BASOS ABS: 0 10*3/uL (ref 0.0–0.1)
BASOS PCT: 0 %
EOS PCT: 0 %
Eosinophils Absolute: 0 10*3/uL (ref 0.0–0.7)
HEMATOCRIT: 27.9 % — AB (ref 36.0–46.0)
Hemoglobin: 9.2 g/dL — ABNORMAL LOW (ref 12.0–15.0)
LYMPHS PCT: 3 %
Lymphs Abs: 0.6 10*3/uL — ABNORMAL LOW (ref 0.7–4.0)
MCH: 33 pg (ref 26.0–34.0)
MCHC: 33 g/dL (ref 30.0–36.0)
MCV: 100 fL (ref 78.0–100.0)
Monocytes Absolute: 0.5 10*3/uL (ref 0.1–1.0)
Monocytes Relative: 3 %
NEUTROS ABS: 19.2 10*3/uL — AB (ref 1.7–7.7)
Neutrophils Relative %: 95 %
PLATELETS: 113 10*3/uL — AB (ref 150–400)
RBC: 2.79 MIL/uL — AB (ref 3.87–5.11)
RDW: 15.1 % (ref 11.5–15.5)
WBC: 20.3 10*3/uL — AB (ref 4.0–10.5)

## 2017-05-05 LAB — MAGNESIUM: MAGNESIUM: 1.9 mg/dL (ref 1.7–2.4)

## 2017-05-05 LAB — PHOSPHORUS: Phosphorus: 3.9 mg/dL (ref 2.5–4.6)

## 2017-05-05 MED ORDER — SPIRONOLACTONE 25 MG PO TABS
100.0000 mg | ORAL_TABLET | Freq: Every day | ORAL | Status: DC
  Administered 2017-05-05 – 2017-05-08 (×3): 100 mg via ORAL
  Filled 2017-05-05 (×3): qty 4

## 2017-05-05 MED ORDER — FUROSEMIDE 40 MG PO TABS
40.0000 mg | ORAL_TABLET | Freq: Every day | ORAL | Status: DC
  Administered 2017-05-05 – 2017-05-08 (×3): 40 mg via ORAL
  Filled 2017-05-05 (×3): qty 1

## 2017-05-05 NOTE — Evaluation (Signed)
Physical Therapy Evaluation Patient Details Name: Rhonda Haley MRN: [id number] DOB: Jan 31, 1966 Today's Date: 05/05/2017   History of Present Illness  51 y.o. female who presents with sepsis from HCAP in the setting of severe ETOH cirrhosis and recurrent pleural effusion  Clinical Impression  Pt pleasant with relatively flat affect stating feeling tired. Pt willing to mobilize and able to perform limited ambulation but required increased supplemental O2. At rest on 5L pt with sats 93% and with moving to EOB sats dropped to 86% with need for 8L to recover, during gait pt required 10L to maintain 94% with drop to 88% after 30' requiring standing rest with cues. 94% on 6L end of session.  Pt with decreased strength, balance, function, cardiopulmonary status and activity tolerance who will benefit from acute therapy to maximize mobility, gait and independence to decrease fall risk and burden of care. Encouraged OOB for meals as well as use of BSC not purewick. Will follow.      Follow Up Recommendations Home health PT;Supervision/Assistance - 24 hour    Equipment Recommendations  None recommended by PT    Recommendations for Other Services OT consult     Precautions / Restrictions Precautions Precautions: Fall Precaution Comments: watch sats      Mobility  Bed Mobility Overal bed mobility: Needs Assistance Bed Mobility: Supine to Sit     Supine to sit: HOB elevated;Min assist     General bed mobility comments: assist to elevate trunk and move legs, rails, HOB elevated, increased time  Transfers Overall transfer level: Needs assistance   Transfers: Sit to/from Stand;Stand Pivot Transfers Sit to Stand: Min assist Stand pivot transfers: Min assist       General transfer comment: cues for hand placement, safety and sequence  Ambulation/Gait Ambulation/Gait assistance: Min assist Ambulation Distance (Feet): 50 Feet Assistive device: Rolling walker (2 wheeled) Gait  Pattern/deviations: Step-through pattern;Decreased stride length   Gait velocity interpretation: Below normal speed for age/gender General Gait Details: cues for position in RW and breathing technique  Stairs            Wheelchair Mobility    Modified Rankin (Stroke Patients Only)       Balance Overall balance assessment: Needs assistance   Sitting balance-Leahy Scale: Fair       Standing balance-Leahy Scale: Fair                               Pertinent Vitals/Pain Pain Assessment: No/denies pain    Home Living Family/patient expects to be discharged to:: Private residence Living Arrangements: Spouse/significant other Available Help at Discharge: Family;Available 24 hours/day Type of Home: House Home Access: Stairs to enter   [employer] of Steps: 3 Home Layout: One level Home Equipment: Walker - 2 wheels;Wheelchair - [employer];Toilet riser      Prior Function Level of Independence: Independent         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        Extremity/Trunk Assessment   Upper Extremity Assessment Upper Extremity Assessment: Generalized weakness    Lower Extremity Assessment Lower Extremity Assessment: Generalized weakness    Cervical / Trunk Assessment Cervical / Trunk Assessment: Normal  Communication   Communication: No difficulties  Cognition Arousal/Alertness: Awake/alert Behavior During Therapy: WFL for tasks assessed/performed Overall Cognitive Status: Within Functional Limits for tasks assessed  General Comments      Exercises     Assessment/Plan    PT Assessment Patient needs continued PT services  PT Problem List Decreased strength;Decreased mobility;Decreased activity tolerance;Decreased balance;Decreased knowledge of use of DME;Cardiopulmonary status limiting activity       PT Treatment Interventions Gait  training;Therapeutic exercise;Patient/family education;DME instruction;Therapeutic activities;Stair training;Balance training;Functional mobility training    PT Goals (Current goals can be found in the Care Plan section)  Acute Rehab PT Goals Patient Stated Goal: return to work PT Goal Formulation: With patient Time For Goal Achievement: 05/19/17 Potential to Achieve Goals: Good    Frequency Min 3X/week   Barriers to discharge Decreased caregiver support      Co-evaluation               AM-PAC PT "6 Clicks" Daily Activity  Outcome Measure Difficulty turning over in bed (including adjusting bedclothes, sheets and blankets)?: A Lot Difficulty moving from lying on back to sitting on the side of the bed? : Unable Difficulty sitting down on and standing up from a chair with arms (e.g., wheelchair, bedside commode, etc,.)?: A Lot Help needed moving to and from a bed to chair (including a wheelchair)?: A Little Help needed walking in hospital room?: A Little Help needed climbing 3-5 steps with a railing? : A Lot 6 Click Score: 13    End of Session Equipment Utilized During Treatment: Oxygen Activity Tolerance: Patient tolerated treatment well Patient left: in chair;with call bell/phone within reach;with nursing/sitter in room Nurse Communication: Mobility status;Precautions PT Visit Diagnosis: Other abnormalities of gait and mobility (R26.89);Difficulty in walking, not elsewhere classified (R26.2);Muscle weakness (generalized) (M62.81)    Time: 1610-9604 PT Time Calculation (min) (ACUTE ONLY): 30 min   Charges:   PT Evaluation $PT Eval Moderate Complexity: 1 Mod PT Treatments $Gait Training: 8-22 mins   PT G Codes:        Delaney Meigs, PT [phone]   Avagrace Botelho B Kinston Magnan 05/05/2017, 2:23 PM

## 2017-05-05 NOTE — Progress Notes (Signed)
PROGRESS NOTE    Rhonda Haley  ZOX:[id number] DOB: 1965-09-22 DOA: 04/28/2017 PCP: Lewis Moccasin, MD   Brief Narrative: Rhonda Haley is a 51 y.o. female with medical history significant for alcoholic cirrhosis with associated ascites, recurrent right hepatic hydrothorax, chronic hyponatremia, protein calorie malnutrition and anemia. Patient had been started on low-dose prednisone (03/21/17) for acute alcoholic hepatitis in the setting of weakly positive autoimmune markers. Over the past 2 weeks prednisone has been tapered to off with last dosage on 9/24. Patient has had asymptomatic leukocytosis dating back to 8/21 where her white count was documented at 23,300 with repeat CBC on 9/12 white count 29,900. On the morning of admission patient awakened with generalized malaise, weakness, chills and subjective fevers. She was not having any abdominal pain, nausea vomiting or diarrhea. She is not had any coughing or sick contacts. She followed up with the GI office she was noted to have a low-grade temperature of 100.4, heart rate was 140, BP was 90/56. Because of the symptoms she was sent to the ER for further evaluation.   In the ER she continued with low-grade temperature and tachycardia as well as tachypnea with a low but normal blood pressure readings. Her white count was 20,700 with left shift as previous. Her lactic acid was 2.15. Her sodium was 122. Abdominal exam was benign. Chest x-ray revealed significant right pleural effusion and right basilar opacity. EDP is concerned over possible HCAP and requests evaluation for observation admission. She is not hypoxic at rest. She does report dyspnea on exertion which is chronic. She subsequently underwent emergent Thoracentesis and was transferred to SDU. She is improved and GI was consulted for further evaluation and Recc's. PCCM following for her Respiratory Failure. Patient was started back on Diuretics last night. Felt a little SOB this AM and repeat CXR  did not show any worsening of Pleural Effusion. Started Patient on IV Steroids with Solumedrol 40 mg q12h on 05/04/17 and patient improving. Will order a repeat Thoracentesis for today to be done by IR.   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   Pleural effusion on right 2/2 hepatic hydrothorax   Anemia   Protein calorie malnutrition (HCC)   Chronic hyponatremia   HCAP (healthcare-associated pneumonia)   Murmur, cardiac   Acute respiratory distress   Pleural effusion associated with hepatic disorder  Sepsis 2/2 presumed HCAP (healthcare-associated pneumonia) -Presents with less than 24 hours of fevers, chills, generalized weakness tachycardia and tachypnea with relative hypotension -Persistent Leukocytosis since 8/21 as patient has been on Steroids and WBC now 20.3 -Mild elevation in serum lactate and went from 2.15 -> 1.5 -> 2.4 -CXR 05/04/17 showed Large right pleural effusion stable since yesterday's post thoracentesis radiograph. Diffuse alveolar opacities in the left lung are worrisome for pneumonia or pulmonary edema -CXR this AM showed Stable to minimal increase in bilateral airspace opacities,left-greater-than-right. Unchanged moderate to large right pleural effusion. -C/w Empiric cefepime/vancomycin IV -Follow up on blood cultures; Urine Cx shows multiple species present  -Obtain sputum culture -Urinary Legionella and Strep Pneumo Urine Ag Negative  -Cycled lactic acid and went to 2.4 -Procalcitonin 0.88 -Respiratory viral panel and influenza PCR Negative  -NS at 125/hr Stopped as patient became  symptomatic and had yohave Thoracentesis  -Started IV Solumedrol 40 q12h -C/w Supplemental  O2  Acute Hypoxic Respiratory Failure from HCAP and Pleural effusion on right 2/2 hepatic hydrothorax s/p Emergent Thoracentesis  -C/w Supplemental O2 and wean as tolerated -Follow Cx Data -As Above  Pleural effusion on right 2/2 hepatic hydrothorax s/p Emergent Thoracentesis  -Underwent right thoracentesis on 8/12 with 2.6 L of clear yellow fluid removed and underwent Thoracentesis on 9/26 and had 1.2 Liters removed. IVF stopped and given IV Lasix 40 mg Once -Patient reports chronic dyspnea on exertion therefore given recurrence of hydrothorax will obtain ambulatory oximetry in the event patient may benefit from home O2 in AM -PCCM Consulted and following and appreciate Recc's; Recommending continuing Abx and perform further Thoracentesis if Significant Dyspnena  -Pleural Effusion was Transudative  -Will repeat Thoracentesis today to be done by IR   Decompensated 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 -Briefly seen at the GI office today-they will see patient in a.m. -Has been on Lasix and spironolactone with recent dosage adjustments -AST mildly elevated at 62 and ALT mildly elevated at 67; Recently had EtOH last week -At time of discharge dry weight documented at 122 lbs with current weight 114 lbs -Patient may be somewhat volume depleted and this could be contributing to her symptoms -Completed Prednisone taper on 9/24; Per GI will not need Steroids now as Taper was stopped -Minimal ascites and no abdominal pain so do not think presenting symptoms consistent with SBP (Canceled IR paracentesis previously ordered but made patient NPO after midnight in the event of the procedure such as thoracentesis indicated) -Gastroenterology consulted and recommending continuing Abx and resume Regular Diet with Protein Supplements -GI recommended resuming Prednisolone Taper initially but she had already completed Taper and does not need steroids for EtOH  Hepatitis -Restarted Home Lasix and Spironolactone but had to be decreased to 100 mg of Aldactone and 40 mg of Lasix due to slight rise in Cr and worsening Hyponatremia -C/w Low Sodium Diet and start Lactulose 10 grams po Daily   -C/w MVI, Folic Acid and Thiamine  -ECHO showed EF of 70-75% with Grade 1 DD  Chronic Hyponatremia -Baseline appears to be around 124-125 -Current sodium 121  likely from Ascites and Diuretics -Diuretics adjusted as above -Follow labs and repeat CMP in AM   Murmur, cardiac -Has loud systolic murmur which was not documented during previous admission -Echocardiogram from July with normal systolic function and normal diastolic function without valvular abnormalities -Give the new murmur and fevers will obtain echocardiogram-need to follow-up on blood cultures as well -Showed EF of 60-65% -Follow up with Cardiology as an outpatient  Anemia of Chronic Disease and IDA -Hemoglobin stable and at baseline -Hb/Hct went from 9.3/28.2 -> 10.4/30.9 -> 9.2/27.9 -C/w Ferrous Sulfate 325 mg po BID  -Continue to Monitor for S/Sx of Bleeding  Protein Calorie Malnutrition  -Continue preadmission Ensure -Albumin is 1.8 -Nutritionist Consulted for futher reccs  Thrombocytopenia -Likely from EtOH; -Platelet Count was 113 -Continue to Monitor closely and repeat CBC in AM   Hyperbilirubinemia -Likely from EtOHism and Decompensated Liver Cirrhosis with Ascites -Bilirubin this AM was 8.0 -Continue to Monitor and repeat CMP in AM   Aortic Valve Thickening -ECHO showed The non-coronary cusp of the aortic valve seems thickened and reduncdant. There is mild ootflow obstuction with this.  -No AI.  -Would consider TEE to further evaluate but will discuss with Cardiology at some point  Leukocytosis -Likely Reactive from Steroid  Demargination -Patient's WBC was 21.8 and went to 20.3 -Continue to Treat Pneumonia -Started Patient on IV Solumedrol so will need to watch CBC  closely -Repeat CBC with Diff in AM  DVT prophylaxis: SCDs given Anemia and Thrombocytopenia  Code Status: FULL CODE Family Communication: No Family present at bedside  Disposition Plan: Remain in SDU for now  Consultants:   PCCM  Gastroenterology   Procedures: Thoracentesis with removal of 1.2 Liters of Fluid   ECHOCARDIOGRAM Study Conclusions  - Left ventricle: The cavity size was normal. Wall thickness was   normal. Systolic function was hyperdynamic. The estimated   ejection fraction was in the range of 70% to 75%. Wall motion was   normal; there were no regional wall motion abnormalities. Doppler   parameters are consistent with abnormal left ventricular   relaxation (grade 1 diastolic dysfunction). - Aortic valve: The non-coronary cusp of the aortic valve seems   thickened and reduncdant. There is mild ootflow obstuction with   this. No AI. Would consider TEE to further evalaute. Valve area   (VTI): 1.72 cm^2. Valve area (Vmax): 1.78 cm^2. Valve area   (Vmean): 1.82 cm^2. - Mitral valve: Valve area by pressure half-time: 2.18 cm^2.   Antimicrobials: Anti-infectives    Start     Dose/Rate Route Frequency Ordered Stop   05-31-2017 2200  ceFEPIme (MAXIPIME) 1 g in dextrose 5 % 50 mL IVPB     1 g 100 mL/hr over 30 Minutes Intravenous Every 8 hours 05/31/17 2055 05/10/17 2159   May 31, 2017 2130  vancomycin (VANCOCIN) IVPB 750 mg/150 ml premix     750 mg 150 mL/hr over 60 Minutes Intravenous Every 12 hours 31-May-2017 2102     2017/05/31 1615  vancomycin (VANCOCIN) IVPB 1000 mg/200 mL premix     1,000 mg 200 mL/hr over 60 Minutes Intravenous  Once 05/31/17 1614 05/31/17 1736   May 31, 2017 1615  piperacillin-tazobactam (ZOSYN) IVPB 3.375 g     3.375 g 100 mL/hr over 30 Minutes Intravenous  Once 2017-05-31 1614 05/31/2017 1758     Subjective: Seen and examined at bedside and felt better after Steroids were started. No CP. No lightheadedness. Did not sleep very  well  Objective: Vitals:   05/05/17 0831 05/05/17 1200 05/05/17 1418 05/05/17 1600  BP: (!) 91/52 100/67  (!) 98/57  Pulse: (!) 104 (!) 104 (!) 125 (!) 104  Resp: (!) 26 (!) 36  (!) 35  Temp: 97.7 F (36.5 C) 97.6 F (36.4 C)  97.7 F (36.5 C)  TempSrc: Axillary Oral  Oral  SpO2: 94% 92% 95% 96%  Weight:      Height:        Intake/Output Summary (Last 24 hours) at 05/05/17 1933 Last data filed at 05/05/17 1600  Gross per 24 hour  Intake              690 ml  Output              200 ml  Net              490 ml   Filed Weights   05/03/17 0600 05/04/17 0530 05/05/17 0430  Weight: 51.2 kg (112 lb 14 oz) 52.9 kg (116 lb 10 oz) 52.5 kg (115 lb 11.9 oz)   Examination: Physical Exam:  Constitutional: Chronically ill appearing jaundiced Caucasian female in NAD  Eyes: Sclerae are icteric. Lids normal ENMT: External ears and nose appear normal. MMM Neck: Supple with no JVD Respiratory: Diminished with coarse breath  sounds and some crackles. No accessory muscle use  Cardiovascular: Tachycardic Rate but regular rhythm, Loud 3/6 Systolic murmur. No LE edema Abdomen: Soft, NT, ND. Bowel sounds present GU: Deferred Musculoskeletal: No contractures; No cyanosis Skin: Warm and Dry. No rashes or lesions on a limited skin eval Neurologic: CN 2-12 grossly intact. Has a slight tremor Psychiatric: Normal mood and affect. Intact judgement and insight   Data Reviewed: I have personally reviewed following labs and imaging studies  CBC:  Recent Labs Lab 04/17/2017 1434 05/03/17 0209 05/04/17 0811 05/05/17 0250  WBC 20.7* 20.5* 21.8* 20.3*  NEUTROABS 17.6*  --  19.0* 19.2*  HGB 9.1* 9.3* 10.4* 9.2*  HCT 26.5* 28.2* 30.9* 27.9*  MCV 100.4* 101.1* 100.3* 100.0  PLT 159 120* PLATELET CLUMPS NOTED ON SMEAR, UNABLE TO ESTIMATE 113*   Basic Metabolic Panel:  Recent Labs Lab 04/21/2017 1434 05/03/17 0209 05/04/17 0811 05/05/17 0250  NA 122* 123* 124* 121*  K 4.7 4.1 4.7 4.7  CL 90*  93* 94* 92*  CO2 21* 20* 17* 23  GLUCOSE 78 103* 87 215*  BUN 18 13 17  25*  CREATININE 0.87 0.88 0.91 1.01*  CALCIUM 8.7* 8.2* 8.4* 8.6*  MG  --   --  1.8 1.9  PHOS  --   --  4.5 3.9   GFR: Estimated Creatinine Clearance: 53.4 mL/min (A) (by C-G formula based on SCr of 1.01 mg/dL (H)). Liver Function Tests:  Recent Labs Lab 04/13/2017 1434 05/03/17 0209 05/04/17 0811 05/05/17 0250  AST 80* 78* 81* 62*  ALT 95* 83* 78* 67*  ALKPHOS 96 88 89 102  BILITOT 7.4* 7.0* 9.6* 8.0*  PROT 6.7 6.6 6.7 6.3*  ALBUMIN 2.2* 2.0* 2.0* 1.8*   No results for input(s): LIPASE, AMYLASE in the last 168 hours.  Recent Labs Lab 04/29/2017 1448 05/04/17 0811  AMMONIA 31 43*   Coagulation Profile:  Recent Labs Lab 04/13/2017 1434 05/03/17 0209 05/04/17 0811  INR 1.38 1.56 1.68   Cardiac Enzymes: No results for input(s): CKTOTAL, CKMB, CKMBINDEX, TROPONINI in the last 168 hours. BNP (last 3 results) No results for input(s): PROBNP in the last 8760 hours. HbA1C: No results for input(s): HGBA1C in the last 72 hours. CBG: No results for input(s): GLUCAP in the last 168 hours. Lipid Profile: No results for input(s): CHOL, HDL, LDLCALC, TRIG, CHOLHDL, LDLDIRECT in the last 72 hours. Thyroid Function Tests: No results for input(s): TSH, T4TOTAL, FREET4, T3FREE, THYROIDAB in the last 72 hours. Anemia Panel: No results for input(s): VITAMINB12, FOLATE, FERRITIN, TIBC, IRON, RETICCTPCT in the last 72 hours. Sepsis Labs:  Recent Labs Lab 04/25/2017 1446 04/12/2017 1817 04/26/2017 2056 05/04/17 1151  PROCALCITON  --   --   --  0.88  LATICACIDVEN 2.15* 1.5 2.4*  --     Recent Results (from the past 240 hour(s))  Blood culture (routine x 2)     Status: None (Preliminary result)   Collection Time: 04/29/2017  2:20 PM  Result Value Ref Range Status   Specimen Description BLOOD LEFT ANTECUBITAL  Final   Special Requests   Final    BOTTLES DRAWN AEROBIC AND ANAEROBIC Blood Culture adequate volume    Culture NO GROWTH 3 DAYS  Final   Report Status PENDING  Incomplete  Blood culture (routine x 2)     Status: None (Preliminary result)   Collection Time: 04/23/2017  2:25 PM  Result Value Ref Range Status   Specimen Description BLOOD LEFT HAND  Final   Special  Requests IN PEDIATRIC BOTTLE Blood Culture adequate volume  Final   Culture NO GROWTH 3 DAYS  Final   Report Status PENDING  Incomplete  Urine culture     Status: Abnormal   Collection Time: 05/01/2017  2:55 PM  Result Value Ref Range Status   Specimen Description URINE, CLEAN CATCH  Final   Special Requests NONE  Final   Culture MULTIPLE SPECIES PRESENT, SUGGEST RECOLLECTION (A)  Final   Report Status 05/04/2017 FINAL  Final  Respiratory Panel by PCR     Status: None   Collection Time: 05/07/2017 11:29 PM  Result Value Ref Range Status   Adenovirus NOT DETECTED NOT DETECTED Final   Coronavirus 229E NOT DETECTED NOT DETECTED Final   Coronavirus HKU1 NOT DETECTED NOT DETECTED Final   Coronavirus NL63 NOT DETECTED NOT DETECTED Final   Coronavirus OC43 NOT DETECTED NOT DETECTED Final   Metapneumovirus NOT DETECTED NOT DETECTED Final   Rhinovirus / Enterovirus NOT DETECTED NOT DETECTED Final   Influenza A NOT DETECTED NOT DETECTED Final   Influenza B NOT DETECTED NOT DETECTED Final   Parainfluenza Virus 1 NOT DETECTED NOT DETECTED Final   Parainfluenza Virus 2 NOT DETECTED NOT DETECTED Final   Parainfluenza Virus 3 NOT DETECTED NOT DETECTED Final   Parainfluenza Virus 4 NOT DETECTED NOT DETECTED Final   Respiratory Syncytial Virus NOT DETECTED NOT DETECTED Final   Bordetella pertussis NOT DETECTED NOT DETECTED Final   Chlamydophila pneumoniae NOT DETECTED NOT DETECTED Final   Mycoplasma pneumoniae NOT DETECTED NOT DETECTED Final  Body fluid culture     Status: None (Preliminary result)   Collection Time: 05/03/17  2:10 AM  Result Value Ref Range Status   Specimen Description PLEURAL RIGHT  Final   Special Requests NONE  Final    Gram Stain NO WBC SEEN NO ORGANISMS SEEN   Final   Culture NO GROWTH 2 DAYS  Final   Report Status PENDING  Incomplete    Radiology Studies: Dg Chest Port 1 View  Result Date: 05/05/2017 CLINICAL DATA:  Shortness of breath, pneumonia and pulmonary edema. EXAM: PORTABLE CHEST 1 VIEW COMPARISON:  05/04/2017 and prior radiographs FINDINGS: Cardiomediastinal silhouette is unchanged. Bilateral airspace opacities, left-greater-than-right, are stable low minimally increased. A moderate to large right pleural effusion is again noted. There is no evidence of pneumothorax. IMPRESSION: Stable to minimal increase in bilateral airspace opacities, left-greater-than-right. Unchanged moderate to large right pleural effusion. Electronically Signed   By: Harmon Pier M.D.   On: 05/05/2017 08:04   Dg Chest Port 1 View  Result Date: 05/04/2017 CLINICAL DATA:  Shortness of breath. History of cirrhosis, protein calorie malnutrition, current smoker. EXAM: PORTABLE CHEST 1 VIEW COMPARISON:  Portable chest x-ray of May 03, 2017 FINDINGS: On the right there is a large E fusion. The aerated portion of the right lung is grossly clear. On the left patchy alveolar opacities persist throughout the long. No left pleural effusion is observed. The cardiac silhouette is obscured. The pulmonary vascularity is indistinct. IMPRESSION: Large right pleural effusion stable since yesterday's post thoracentesis radiograph. Diffuse alveolar opacities in the left lung are worrisome for pneumonia or pulmonary edema. Electronically Signed   By: David  [place] M.D.   On: 05/04/2017 09:36   Scheduled Meds: . escitalopram  20 mg Oral Daily  . feeding supplement (ENSURE ENLIVE)  237 mL Oral BID BM  . ferrous sulfate  325 mg Oral BID WC  . furosemide  40 mg Oral Daily  . lactulose  10 g Oral Daily  . mouth rinse  15 mL Mouth Rinse BID  . methylPREDNISolone (SOLU-MEDROL) injection  40 mg Intravenous Q12H  . multivitamin with minerals  1  tablet Oral Daily  . pantoprazole  20 mg Oral QAC breakfast  . sodium chloride flush  3 mL Intravenous Q12H  . spironolactone  100 mg Oral Daily  . thiamine  100 mg Oral Daily   Continuous Infusions: . ceFEPime (MAXIPIME) IV Stopped (05/05/17 1336)  . vancomycin 750 mg (05/05/17 0900)    LOS: 2 days    Merlene Laughter, DO [facility] Pager [phone]  If 7PM-7AM, please contact night-coverage [email or web address] Password [email or web address] 05/05/2017, 7:33 PM

## 2017-05-05 NOTE — Progress Notes (Signed)
Daily Rounding Note  05/05/2017, 8:19 AM  LOS: 2 days   SUBJECTIVE:   Chief complaint: SOB, persists    Eating better this AM, no abd pain.  No swelling.    OBJECTIVE:         Vital signs in last 24 hours:    Temp:  [97.6 F (36.4 C)-98.5 F (36.9 C)] 97.7 F (36.5 C) (09/28 0300) Pulse Rate:  [102-128] 102 (09/28 0300) Resp:  [21-44] 21 (09/28 0300) BP: (91-104)/(55-63) 104/60 (09/28 0300) SpO2:  [91 %-96 %] 91 % (09/28 0300) Weight:  [52.5 kg (115 lb 11.9 oz)] 52.5 kg (115 lb 11.9 oz) (09/28 0430) Last BM Date: 05/04/17 Filed Weights   05/03/17 0600 05/04/17 0530 05/05/17 0430  Weight: 51.2 kg (112 lb 14 oz) 52.9 kg (116 lb 10 oz) 52.5 kg (115 lb 11.9 oz)   General: jaundiced, cachectic.  comfortable   Heart: Tachy, regular Chest: diminished about 1/2 way up on right.  Crackles on left.   Abdomen: soft, NT, ND.  BS hypoactive.    Extremities: no CCE Neuro/Psych:  Alert, oriented x 3.  Tremulous but no asterixis.    Intake/Output from previous day: 09/27 0701 - 09/28 0700 In: 1090 [P.O.:240; IV Piggyback:850] Out: 750 [Urine:750]  Intake/Output this shift: No intake/output data recorded.  Lab Results:  Recent Labs  05/03/17 0209 05/04/17 0811 05/05/17 0250  WBC 20.5* 21.8* 20.3*  HGB 9.3* 10.4* 9.2*  HCT 28.2* 30.9* 27.9*  PLT 120* PLATELET CLUMPS NOTED ON SMEAR, UNABLE TO ESTIMATE 113*   BMET  Recent Labs  05/03/17 0209 05/04/17 0811 05/05/17 0250  NA 123* 124* 121*  K 4.1 4.7 4.7  CL 93* 94* 92*  CO2 20* 17* 23  GLUCOSE 103* 87 215*  BUN 13 17 25*  CREATININE 0.88 0.91 1.01*  CALCIUM 8.2* 8.4* 8.6*   LFT  Recent Labs  05/03/17 0209 05/04/17 0811 05/05/17 0250  PROT 6.6 6.7 6.3*  ALBUMIN 2.0* 2.0* 1.8*  AST 78* 81* 62*  ALT 83* 78* 67*  ALKPHOS 88 89 102  BILITOT 7.0* 9.6* 8.0*   PT/INR  Recent Labs  05/03/17 0209 05/04/17 0811  LABPROT 18.6* 19.6*  INR 1.56 1.68    Hepatitis Panel No results for input(s): HEPBSAG, HCVAB, HEPAIGM, HEPBIGM in the last 72 hours.  Studies/Results: Dg Chest Port 1 View  Result Date: 05/05/2017 CLINICAL DATA:  Shortness of breath, pneumonia and pulmonary edema. EXAM: PORTABLE CHEST 1 VIEW COMPARISON:  05/04/2017 and prior radiographs FINDINGS: Cardiomediastinal silhouette is unchanged. Bilateral airspace opacities, left-greater-than-right, are stable low minimally increased. A moderate to large right pleural effusion is again noted. There is no evidence of pneumothorax. IMPRESSION: Stable to minimal increase in bilateral airspace opacities, left-greater-than-right. Unchanged moderate to large right pleural effusion. Electronically Signed   By: Harmon Pier M.D.   On: 05/05/2017 08:04   Dg Chest Port 1 View  Result Date: 05/04/2017 CLINICAL DATA:  Shortness of breath. History of cirrhosis, protein calorie malnutrition, current smoker. EXAM: PORTABLE CHEST 1 VIEW COMPARISON:  Portable chest x-ray of May 03, 2017 FINDINGS: On the right there is a large E fusion. The aerated portion of the right lung is grossly clear. On the left patchy alveolar opacities persist throughout the long. No left pleural effusion is observed. The cardiac silhouette is obscured. The pulmonary vascularity is indistinct. IMPRESSION: Large right pleural effusion stable since yesterday's post thoracentesis radiograph. Diffuse alveolar opacities in the left  lung are worrisome for pneumonia or pulmonary edema. Electronically Signed   By: David  [place] M.D.   On: 05/04/2017 09:36   Scheduled Meds: . escitalopram  20 mg Oral Daily  . feeding supplement (ENSURE ENLIVE)  237 mL Oral BID BM  . ferrous sulfate  325 mg Oral BID WC  . furosemide  60 mg Oral Daily  . lactulose  10 g Oral Daily  . mouth rinse  15 mL Mouth Rinse BID  . methylPREDNISolone (SOLU-MEDROL) injection  40 mg Intravenous Q12H  . multivitamin with minerals  1 tablet Oral Daily  .  pantoprazole  20 mg Oral QAC breakfast  . sodium chloride flush  3 mL Intravenous Q12H  . spironolactone  250 mg Oral Daily  . thiamine  100 mg Oral Daily   Continuous Infusions: . ceFEPime (MAXIPIME) IV Stopped (05/05/17 1914)  . vancomycin Stopped (05/05/17 0017)   PRN Meds:.ondansetron **OR** ondansetron (ZOFRAN) IV   ASSESMENT:   *  Cirrhosis.  ETOH and weakly + autoimmune markers. ETOH hepatitis with Prednisolone 8/10 - 9/25 (a 16 day taper was added by Dr Leone Payor on 9/13).  LFTs steadily improved.   No prednisolone currently in place.  Discriminant fx score 30.    *  Right pleural effusion. 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   S/p 1.2 liter thoracentesis 9/26: transudative, sterile.  Effusion still moderate to large, stable per CXR.   Aldactone 250 mg, Lasix 60 mg daily.  Renal function slightly increased.   *  HCAP on left with sepsis.  Hypoxic resp failure.  Day 4 abx.  Vanc, Maxipime. WBCs remain elevated, oxygen sats still in low 90s and tachycardic to 120s.  Solumedrol added 9/27.   .     *  Hyponatremia. Chronic. On 2 gm Na diet.     *  Normocytic anemia.  Overall improved from previous measures in last several months.    *  Thrombocytopenia.  Non-critical.    *  Protein calorie malnutrition. Severe.  RD consulted on 9/26.  Ensure BID in place.  *  Subtle encephalopathy.  Low dose lactulose started 9/26.  Ammonia 31 >> 45 but clinically improved.  *  Indeterminate right kidney lesion per MRI 02/2017.  Suggestion was for renal protocol CT as less susceptible to respiratory motion artifact.  Indeterminate left liver lesion per MRI 02/2017: repeat MRI 08/2016.     PLAN   *  Adjusted diuretics to Lasix 40 mg and Aldactone 100 mg daily given the decline in Na and in renal function.     *  BMET in AM  *  After further consideration, pt does not need to restart Prednisolone taper. 16 day taper would have finished tomorrow and her LFTs are improving.  Additionally started on  Solumedrol for her resp issues on 9/27.    *  Has follow up with [facility] in [place] for 10/8 at 1 PM.  With Annamarie Major NP.         Rhonda Haley  05/05/2017, 8:19 AM Pager: [phone]

## 2017-05-06 ENCOUNTER — Inpatient Hospital Stay (HOSPITAL_COMMUNITY): Payer: 59

## 2017-05-06 DIAGNOSIS — K7031 Alcoholic cirrhosis of liver with ascites: Secondary | ICD-10-CM

## 2017-05-06 DIAGNOSIS — K7200 Acute and subacute hepatic failure without coma: Secondary | ICD-10-CM

## 2017-05-06 DIAGNOSIS — K7290 Hepatic failure, unspecified without coma: Secondary | ICD-10-CM

## 2017-05-06 LAB — PROTIME-INR
INR: 1.55
PROTHROMBIN TIME: 18.4 s — AB (ref 11.4–15.2)

## 2017-05-06 LAB — CBC WITH DIFFERENTIAL/PLATELET
BASOS PCT: 0 %
Basophils Absolute: 0 10*3/uL (ref 0.0–0.1)
Eosinophils Absolute: 0 10*3/uL (ref 0.0–0.7)
Eosinophils Relative: 0 %
HCT: 29.2 % — ABNORMAL LOW (ref 36.0–46.0)
Hemoglobin: 10.2 g/dL — ABNORMAL LOW (ref 12.0–15.0)
LYMPHS PCT: 6 %
Lymphs Abs: 1.2 10*3/uL (ref 0.7–4.0)
MCH: 34.5 pg — ABNORMAL HIGH (ref 26.0–34.0)
MCHC: 34.9 g/dL (ref 30.0–36.0)
MCV: 98.6 fL (ref 78.0–100.0)
MONO ABS: 1.2 10*3/uL — AB (ref 0.1–1.0)
Monocytes Relative: 6 %
NEUTROS PCT: 88 %
Neutro Abs: 18.3 10*3/uL — ABNORMAL HIGH (ref 1.7–7.7)
PLATELETS: 112 10*3/uL — AB (ref 150–400)
RBC: 2.96 MIL/uL — AB (ref 3.87–5.11)
RDW: 15.1 % (ref 11.5–15.5)
WBC: 20.7 10*3/uL — AB (ref 4.0–10.5)

## 2017-05-06 LAB — BODY FLUID CULTURE
CULTURE: NO GROWTH
Gram Stain: NONE SEEN

## 2017-05-06 LAB — COMPREHENSIVE METABOLIC PANEL
ALT: 75 U/L — ABNORMAL HIGH (ref 14–54)
AST: 69 U/L — ABNORMAL HIGH (ref 15–41)
Albumin: 2 g/dL — ABNORMAL LOW (ref 3.5–5.0)
Alkaline Phosphatase: 114 U/L (ref 38–126)
Anion gap: 13 (ref 5–15)
BILIRUBIN TOTAL: 7.1 mg/dL — AB (ref 0.3–1.2)
BUN: 35 mg/dL — ABNORMAL HIGH (ref 6–20)
CHLORIDE: 95 mmol/L — AB (ref 101–111)
CO2: 18 mmol/L — ABNORMAL LOW (ref 22–32)
Calcium: 9.5 mg/dL (ref 8.9–10.3)
Creatinine, Ser: 0.95 mg/dL (ref 0.44–1.00)
Glucose, Bld: 127 mg/dL — ABNORMAL HIGH (ref 65–99)
POTASSIUM: 4.9 mmol/L (ref 3.5–5.1)
Sodium: 126 mmol/L — ABNORMAL LOW (ref 135–145)
TOTAL PROTEIN: 7.1 g/dL (ref 6.5–8.1)

## 2017-05-06 LAB — MAGNESIUM: Magnesium: 2.2 mg/dL (ref 1.7–2.4)

## 2017-05-06 LAB — VANCOMYCIN, TROUGH: VANCOMYCIN TR: 27 ug/mL — AB (ref 15–20)

## 2017-05-06 LAB — PROCALCITONIN: Procalcitonin: 0.75 ng/mL

## 2017-05-06 LAB — PHOSPHORUS: PHOSPHORUS: 3.8 mg/dL (ref 2.5–4.6)

## 2017-05-06 MED ORDER — LIDOCAINE HCL (PF) 1 % IJ SOLN
INTRAMUSCULAR | Status: AC
  Filled 2017-05-06: qty 20

## 2017-05-06 MED ORDER — LACTULOSE 10 GM/15ML PO SOLN
20.0000 g | Freq: Two times a day (BID) | ORAL | Status: DC
  Administered 2017-05-06 – 2017-05-10 (×6): 20 g via ORAL
  Filled 2017-05-06 (×7): qty 30

## 2017-05-06 MED ORDER — VANCOMYCIN HCL IN DEXTROSE 1-5 GM/200ML-% IV SOLN
1000.0000 mg | INTRAVENOUS | Status: DC
  Administered 2017-05-06 – 2017-05-10 (×5): 1000 mg via INTRAVENOUS
  Filled 2017-05-06 (×5): qty 200

## 2017-05-06 MED ORDER — LACTULOSE 10 GM/15ML PO SOLN: 20.0000 g | Freq: Every day | ORAL | Status: DC

## 2017-05-06 NOTE — Progress Notes (Signed)
Patient ID: Rhonda Haley, female   DOB: Oct 17, 1965, 51 y.o.   MRN: [id number]    Progress Note   Subjective   Pt down getting thoracentesis now- off floor   INR 1.55 WBC 20  NA 126 up,  BUN 35 /creat 0.95 tbili 7.1  See below   Objective   Vital signs in last 24 hours: Temp:  [97.1 F (36.2 C)-98.2 F (36.8 C)] 97.1 F (36.2 C) (09/29 1129) Pulse Rate:  [71-125] 107 (09/29 0800) Resp:  [16-35] 31 (09/29 0800) BP: (92-137)/(54-84) 95/55 (09/29 0800) SpO2:  [89 %-100 %] 97 % (09/29 0400) Last BM Date: 05/06/17 General:    white female Who acutely and chronically ill-appearing, jaundiced Heart:  Regular rate and rhythm; no murmurs Lungs: Abdomen:   Extremities:  Without edema. Neurologic:  Alert and oriented,  grossly normal neurologically. Psych:  Cooperative. Normal mood and affect.  Intake/Output from previous day: 09/28 0701 - 09/29 0700 In: 540 [P.O.:240; IV Piggyback:300] Out: 400 [Urine:400] Intake/Output this shift: Total I/O In: 120 [P.O.:120] Out: -   Lab Results:  Recent Labs  05/04/17 0811 05/05/17 0250 05/06/17 0608  WBC 21.8* 20.3* 20.7*  HGB 10.4* 9.2* 10.2*  HCT 30.9* 27.9* 29.2*  PLT PLATELET CLUMPS NOTED ON SMEAR, UNABLE TO ESTIMATE 113* 112*   BMET  Recent Labs  05/04/17 0811 05/05/17 0250 05/06/17 0608  NA 124* 121* 126*  K 4.7 4.7 4.9  CL 94* 92* 95*  CO2 17* 23 18*  GLUCOSE 87 215* 127*  BUN 17 25* 35*  CREATININE 0.91 1.01* 0.95  CALCIUM 8.4* 8.6* 9.5   LFT  Recent Labs  05/06/17 0608  PROT 7.1  ALBUMIN 2.0*  AST 69*  ALT 75*  ALKPHOS 114  BILITOT 7.1*   PT/INR  Recent Labs  05/04/17 0811 05/06/17 0608  LABPROT 19.6* 18.4*  INR 1.68 1.55    Studies/Results: Dg Chest 1 View  Result Date: 05/06/2017 CLINICAL DATA:  Shortness of breath. EXAM: CHEST 1 VIEW COMPARISON:  05/05/2017 FINDINGS: The cardiomediastinal silhouette is unchanged. A moderate to large right pleural effusion appears slightly smaller.  Left greater than right lung airspace opacities have not significantly changed. No sizable left pleural effusion or pneumothorax is identified. IMPRESSION: 1. Moderate to large right pleural effusion, slightly decreased in size. 2. Unchanged bilateral airspace disease. Electronically Signed   By: Sebastian Ache M.D.   On: 05/06/2017 08:14   Dg Chest Port 1 View  Result Date: 05/05/2017 CLINICAL DATA:  Shortness of breath, pneumonia and pulmonary edema. EXAM: PORTABLE CHEST 1 VIEW COMPARISON:  05/04/2017 and prior radiographs FINDINGS: Cardiomediastinal silhouette is unchanged. Bilateral airspace opacities, left-greater-than-right, are stable low minimally increased. A moderate to large right pleural effusion is again noted. There is no evidence of pneumothorax. IMPRESSION: Stable to minimal increase in bilateral airspace opacities, left-greater-than-right. Unchanged moderate to large right pleural effusion. Electronically Signed   By: Harmon Pier M.D.   On: 05/05/2017 08:04       Assessment / Plan:    #28 51 year old white female with severely decompensated alcoholic cirrhosis and alcoholic hepatitis who had been on a prednisone taper, now off who was admitted with fever and respiratory distress. She has history of hepatic hydrothorax, now with recurrence and bilateral infiltrates on chest x-ray.  Patient's symptomatic now with recurrent moderate to large right pleural effusion-undergoing thoracentesis currently Continues on Maxipime Goal has been to manage ascites and hepatic hydrothorax with diuretics which to date has been difficult. Continue Aldactone 100  mg, and Lasix 40 mg today   #2-hepatic encephalopathy-continue lactulose  MELD =19  (not examined ) -pt off floor - Dr. 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 She went for a thoracentesis earlier today which COMPLETELY removed via effusion. There was a small postprocedure pneumothorax that apparently caused minimal symptoms. Her husband says she is confused today. She seems to have become disoriented and asking the same questions repeatedly. She denies abdominal pain, there are no reports of diarrhea or GI bleeding.  ROS: Cardiovascular:  no chest pain Respiratory: Dyspnea with minimal exertion  Objective:  Med list reviewed  Vital signs in last 24 hrs: Vitals:   05/06/17 1400 05/06/17 1505  BP: (!) 96/51 (!) 93/57  Pulse: (!) 114   Resp:  18  Temp:  97.7 F (36.5 C)  SpO2: 100%     Physical Exam Cachectic woman, chronically ill-appearing, sitting on the edge of the bed playing with her IV and wires.  HEENT: sclera anicteric, oral mucosa moist without lesions  Neck: supple, no thyromegaly, JVD or lymphadenopathy  Cardiac: RRR without murmurs, S1S2 heard, no peripheral edema  Pulm: clear to auscultation bilaterally, normal RR and effort noted  Abdomen: soft, no tenderness, with active bowel sounds. Mild ascites  Skin; warm and dry, no jaundice or rash No asterixis Recent Labs:   Recent Labs Lab  05/04/17 0811 05/05/17 0250 05/06/17 0608  WBC 21.8* 20.3* 20.7*  HGB 10.4* 9.2* 10.2*  HCT 30.9* 27.9* 29.2*  PLT PLATELET CLUMPS NOTED ON SMEAR, UNABLE TO ESTIMATE 113* 112*    Recent Labs Lab 05/06/17 0608  NA 126*  K 4.9  CL 95*  CO2 18*  BUN 35*  ALBUMIN 2.0*  ALKPHOS 114  ALT 75*  AST 69*  GLUCOSE 127*    Recent Labs Lab 05/06/17 0608  INR 1.55   Ammonia 43 yesterday Radiologic studies:  See post thoracentesis chest x-ray  @ Assessment:  Alcoholic cirrhosis Recurrent hepatic hydrothorax Hyponatremia, which seems to indicate renal vascular/volume sensitivity the setting of alcoholic liver disease. Hepatic encephalopathy   Plan: I increased lactulose dose to twice daily. Please closely monitor her renal function. The BUN is rising, and I am not sure if it is from prerenal azotemia or the effect of steroids. Do not increase her diuretic dose until we see stability of that. Her current meld score of about 19 might preclude TIPS placement.  Her husband had many questions regarding why her liver disease  decompensated. He knows that the alcohol is playing a role, but he was confused and concerned because he had recently been told that her liver was "doing good". I described the tenuous state that such a liver condition typically has, in that the kidney function can be easily affected. She remains quite sick, and unfortunately is not a liver transplant candidate at present due to recent alcohol use.  Continued antibiotic treatment for pneumonia, steroids per the primary team.  Total 35 minutes time, over half spent in discussion with her husband. Charlie Pitter III Pager [phone] Mon-Fri 8a-5p [phone] after 5p, weekends, holidays

## 2017-05-06 NOTE — Progress Notes (Signed)
PROGRESS NOTE    Rhonda Haley  WJX:[id number] DOB: 18-Jan-1966 DOA: 04/25/2017 PCP: Lewis Moccasin, MD   Brief Narrative: Rhonda Haley is a 51 y.o. female with medical history significant for alcoholic cirrhosis with associated ascites, recurrent right hepatic hydrothorax, chronic hyponatremia, protein calorie malnutrition and anemia. Patient had been started on low-dose prednisone (03/21/17) for acute alcoholic hepatitis in the setting of weakly positive autoimmune markers. Over the past 2 weeks prednisone has been tapered to off with last dosage on 9/24. Patient has had asymptomatic leukocytosis dating back to 8/21 where her white count was documented at 23,300 with repeat CBC on 9/12 white count 29,900. On the morning of admission patient awakened with generalized malaise, weakness, chills and subjective fevers. She was not having any abdominal pain, nausea vomiting or diarrhea. She is not had any coughing or sick contacts. She followed up with the GI office she was noted to have a low-grade temperature of 100.4, heart rate was 140, BP was 90/56. Because of the symptoms she was sent to the ER for further evaluation.   In the ER she continued with low-grade temperature and tachycardia as well as tachypnea with a low but normal blood pressure readings. Her white count was 20,700 with left shift as previous. Her lactic acid was 2.15. Her sodium was 122. Abdominal exam was benign. Chest x-ray revealed significant right pleural effusion and right basilar opacity. EDP is concerned over possible HCAP and requests evaluation for observation admission. She is not hypoxic at rest. She does report dyspnea on exertion which is chronic. She subsequently underwent emergent Thoracentesis and was transferred to SDU. She is improved and GI was consulted for further evaluation and Recc's. PCCM following for her Respiratory Failure. Patient was started back on Diuretics last night. Felt a little SOB this AM and repeat CXR  did not show any worsening of Pleural Effusion. Started Patient on IV Steroids with Solumedrol 40 mg q12h on 05/04/17 and patient improving. Repeat Thoracentesis done by Dr. Grace Isaac and patient had an ex-vacuo pneumothorax after but remained stable.    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   Pleural effusion on right 2/2 hepatic hydrothorax   Anemia   Protein calorie malnutrition (HCC)   Chronic hyponatremia   HCAP (healthcare-associated pneumonia)   Murmur, cardiac   Acute respiratory distress   Pleural effusion associated with hepatic disorder  Sepsis 2/2 presumed HCAP (healthcare-associated pneumonia) -Presented with less than 24 hours of fevers, chills, generalized weakness tachycardia and tachypnea with relative hypotension -Persistent Leukocytosis since 8/21 as patient has been on Steroids and WBC now 20.3 -Mild elevation in serum lactate and went from 2.15 -> 1.5 -> 2.4 -CXR 05/04/17 showed Large right pleural effusion stable since yesterday's post thoracentesis radiograph. Diffuse alveolar opacities in the left lung are worrisome for pneumonia or pulmonary edema -CXR this AM showed Stable to minimal increase in bilateral airspace opacities,left-greater-than-right. Unchanged moderate to large right pleural effusion. -C/w Empiric Cefepime/Vancomycin IV -Follow up on blood cultures (NGTD at 4 Days); Urine Cx shows multiple species present  -Obtain sputum culture -Urinary Legionella and Strep Pneumo Urine Ag Negative  -Cycled lactic acid and went to 2.4 -Procalcitonin 0.88 and improved to 0.75 -Respiratory viral  panel and influenza PCR Negative  -NS at 125/hr Stopped as patient became symptomatic and had  yohave Thoracentesis  -Started IV Solumedrol 40 q12h -C/w Supplemental O2  Acute Hypoxic Respiratory Failure from HCAP and Pleural effusion on right 2/2 hepatic hydrothorax s/p Emergent Thoracentesis  -C/w Supplemental O2 and wean as tolerated -Follow Cx Data -As Above  Pleural Effusion on right 2/2 hepatic hydrothorax s/p Emergent Thoracentesis  -Underwent right thoracentesis on 8/12 with 2.6 L of clear yellow fluid removed and underwent Thoracentesis on 9/26 and had 1.2 Liters removed. IVF stopped and given IV Lasix 40 mg Once -Patient reports chronic dyspnea on exertion therefore given recurrence of hydrothorax will obtain ambulatory oximetry in the event patient may benefit from home O2 in AM -PCCM Consulted and following and appreciate Recc's; Recommending continuing Abx and perform further Thoracentesis if Significant Dyspnena  -Pleural Effusion was Transudative  -Repeat Thoracentesis ordered yesterday and done today 05/06/17 and Dr. Grace Isaac Removed 1.4 Liters; Discussed with Dr. Grace Isaac as post Thoracentesis CXR showed Interval development of a small presumably ex vacuo pneumothorax post right-sided thoracentesis. Near complete resolution of right-sided pleural effusion post thoracentesis. Similar findings of pulmonary edema with worsening left basilar opacities, atelectasis versus infiltrate -Repeat CXR in AM   Decompensated Alcoholic Cirrhosis of liver with Ascites w/weakly + autoimmune markers -Briefly seen at the GI office day of admission and then directed to the ED -Has been on Lasix and spironolactone with recent dosage adjustments -AST mildly elevated at 69 and ALT mildly elevated at 75; Recently had EtOH last week -At time of discharge dry weight documented at 122 lbs with current weight 115 lbs -Patient may be somewhat volume depleted and this could be contributing to her symptoms -Completed Prednisone taper on 9/24; Per GI will not need Steroids now as Taper was  stopped -Minimal ascites and no abdominal pain so do not think presenting symptoms consistent with SBP (Canceled IR paracentesis previously ordered but made patient NPO after midnight in the event of the procedure such as thoracentesis indicated) -Gastroenterology consulted and recommending continuing Abx and resume Regular Diet with Protein Supplements -GI recommended resuming Prednisolone Taper initially but she had already completed Taper and does not need steroids for EtOH Hepatitis -Restarted Home Lasix and Spironolactone but had to be decreased to 100 mg of Aldactone and 40 mg of Lasix due to slight rise in Cr and worsening Hyponatremia -C/w Low Sodium Diet and started Lactulose 10 grams po Daily; Lactulose increased to 20 grams po BID as patient seemed slightly more confused -Per GI not rechecking Ammonia Levels  -C/w MVI, Folic Acid and Thiamine  -ECHO showed EF of 70-75% with Grade 1 DD  Chronic Hyponatremia -Baseline appears to be around 124-125 -Current sodium 126  -Diuretics adjusted as above -Follow labs and repeat CMP in AM   Murmur, Cardiac -Has loud systolic murmur which was not documented during previous admission -Echocardiogram from July with normal systolic function and normal diastolic function without valvular abnormalities -Give the new murmur and fevers will obtain echocardiogram-need to follow-up on blood cultures as well -Showed EF of 60-65% -Follow up with Cardiology as an outpatient  Anemia of Chronic Disease and IDA -Hemoglobin stable and at baseline -Hb/Hct went from 9.3/28.2 -> 10.4/30.9 -> 9.2/27.9 -> 10.2/29.2 -C/w Ferrous Sulfate 325 mg po BID  -Continue to Monitor for S/Sx of Bleeding  Protein Calorie Malnutrition  -Continue preadmission Ensure -Albumin is 1.8 -Nutritionist Consulted for futher reccs  Thrombocytopenia -Likely from EtOH; -Platelet Count was 126 -  Continue to Monitor closely and repeat CBC in AM   Hyperbilirubinemia -Likely  from EtOHism and Decompensated Liver Cirrhosis with Ascites -Bilirubin this AM was 7.1 -Continue to Monitor and repeat CMP in AM   Aortic Valve Thickening -ECHO showed The non-coronary cusp of the aortic valve seems thickened and reduncdant. There is mild ootflow obstuction with this.  -No AI.  -Would consider TEE to further evaluate but will discuss with Cardiology at some point  Leukocytosis -Likely Reactive from Steroid Demargination -Patient's WBC was 21.8 and went to 20.7 -Continue to Treat Pneumonia -Started Patient on IV Solumedrol so will need to watch CBC closely -Repeat CBC with Diff in AM  DVT prophylaxis: SCDs given Anemia and Thrombocytopenia  Code Status: FULL CODE Family Communication: No Family present at bedside  Disposition Plan: Remain in SDU for now  Consultants:   PCCM  Gastroenterology   Procedures: Thoracentesis with removal of 1.2 Liters of Fluid 9/26  ECHOCARDIOGRAM Study Conclusions  - Left ventricle: The cavity size was normal. Wall thickness was   normal. Systolic function was hyperdynamic. The estimated   ejection fraction was in the range of 70% to 75%. Wall motion was   normal; there were no regional wall motion abnormalities. Doppler   parameters are consistent with abnormal left ventricular   relaxation (grade 1 diastolic dysfunction). - Aortic valve: The non-coronary cusp of the aortic valve seems   thickened and reduncdant. There is mild ootflow obstuction with   this. No AI. Would consider TEE to further evalaute. Valve area   (VTI): 1.72 cm^2. Valve area (Vmax): 1.78 cm^2. Valve area   (Vmean): 1.82 cm^2. - Mitral valve: Valve area by pressure half-time: 2.18 cm^2.  U/S Guided Right Side Thoracentesis yielding 1.4 Liters of Serous Pleural Fluid on 9/29 done by Dr. Grace Isaac.    Antimicrobials: Anti-infectives    Start     Dose/Rate Route Frequency Ordered Stop   05/06/17 2200  vancomycin (VANCOCIN) IVPB 1000 mg/200 mL premix      1,000 mg 200 mL/hr over 60 Minutes Intravenous Every 24 hours 05/06/17 1224     May 15, 2017 2200  ceFEPIme (MAXIPIME) 1 g in dextrose 5 % 50 mL IVPB     1 g 100 mL/hr over 30 Minutes Intravenous Every 8 hours 05/15/17 2055 05/10/17 2159   May 15, 2017 2130  vancomycin (VANCOCIN) IVPB 750 mg/150 ml premix  Status:  Discontinued     750 mg 150 mL/hr over 60 Minutes Intravenous Every 12 hours May 15, 2017 2102 05/06/17 1213   05/15/2017 1615  vancomycin (VANCOCIN) IVPB 1000 mg/200 mL premix     1,000 mg 200 mL/hr over 60 Minutes Intravenous  Once 05-15-17 1614 15-May-2017 1736   2017-05-15 1615  piperacillin-tazobactam (ZOSYN) IVPB 3.375 g     3.375 g 100 mL/hr over 30 Minutes Intravenous  Once 05-15-2017 1614 2017-05-15 1758     Subjective: Seen and examined at bedside and stated she had a rough night. Appeared to be a little more confused this AM. No CP. SOB was still there. No other concerns or complaints at this time.   Objective: Vitals:   05/06/17 1350 05/06/17 1354 05/06/17 1400 05/06/17 1505  BP: (!) 103/51 (!) 106/49 (!) 96/51 (!) 93/57  Pulse:   (!) 114   Resp:    18  Temp:    97.7 F (36.5 C)  TempSrc:    Oral  SpO2:   100%   Weight:      Height:  Intake/Output Summary (Last 24 hours) at 05/06/17 1754 Last data filed at 05/06/17 1430  Gross per 24 hour  Intake              270 ml  Output              200 ml  Net               70 ml   Filed Weights   05/03/17 0600 05/04/17 0530 05/05/17 0430  Weight: 51.2 kg (112 lb 14 oz) 52.9 kg (116 lb 10 oz) 52.5 kg (115 lb 11.9 oz)   Examination: Physical Exam:  Constitutional: Chronically ill appearing jaundiced Caucasian female who is a little more confused than yesterday Eyes: Sclerae are icteric. Lids normal ENMT: External Ears and Nose appear normal. MMM Neck: Supple with no JVD Respiratory: Diminished with coarse breath sounds on left and some scattered crackles on right. Patient was slightly tachypenic.  Cardiovascular:  Tachycardic rate but regular rhythm. No extremity edema Abdomen: Soft, NT, ND. Bowel sounds present  GU: Deferred Musculoskeletal: No contractures; No cyanosis Skin: Jaundiced. Has some telangiectasias on chest wall. Warm and Dry. No rashes appreciated Neurologic: CN 2-12 grossly intact; Had some tremors.  Psychiatric: Slightly anxious mood and normal affect. Impaired judgement and insight  Data Reviewed: I have personally reviewed following labs and imaging studies  CBC:  Recent Labs Lab 04/24/2017 1434 05/03/17 0209 05/04/17 0811 05/05/17 0250 05/06/17 0608  WBC 20.7* 20.5* 21.8* 20.3* 20.7*  NEUTROABS 17.6*  --  19.0* 19.2* 18.3*  HGB 9.1* 9.3* 10.4* 9.2* 10.2*  HCT 26.5* 28.2* 30.9* 27.9* 29.2*  MCV 100.4* 101.1* 100.3* 100.0 98.6  PLT 159 120* PLATELET CLUMPS NOTED ON SMEAR, UNABLE TO ESTIMATE 113* 112*   Basic Metabolic Panel:  Recent Labs Lab 04/16/2017 1434 05/03/17 0209 05/04/17 0811 05/05/17 0250 05/06/17 0608  NA 122* 123* 124* 121* 126*  K 4.7 4.1 4.7 4.7 4.9  CL 90* 93* 94* 92* 95*  CO2 21* 20* 17* 23 18*  GLUCOSE 78 103* 87 215* 127*  BUN 18 13 17  25* 35*  CREATININE 0.87 0.88 0.91 1.01* 0.95  CALCIUM 8.7* 8.2* 8.4* 8.6* 9.5  MG  --   --  1.8 1.9 2.2  PHOS  --   --  4.5 3.9 3.8   GFR: Estimated Creatinine Clearance: 56.8 mL/min (by C-G formula based on SCr of 0.95 mg/dL). Liver Function Tests:  Recent Labs Lab 04/26/2017 1434 05/03/17 0209 05/04/17 0811 05/05/17 0250 05/06/17 0608  AST 80* 78* 81* 62* 69*  ALT 95* 83* 78* 67* 75*  ALKPHOS 96 88 89 102 114  BILITOT 7.4* 7.0* 9.6* 8.0* 7.1*  PROT 6.7 6.6 6.7 6.3* 7.1  ALBUMIN 2.2* 2.0* 2.0* 1.8* 2.0*   No results for input(s): LIPASE, AMYLASE in the last 168 hours.  Recent Labs Lab 04/12/2017 1448 05/04/17 0811  AMMONIA 31 43*   Coagulation Profile:  Recent Labs Lab 04/16/2017 1434 05/03/17 0209 05/04/17 0811 05/06/17 0608  INR 1.38 1.56 1.68 1.55   Cardiac Enzymes: No results for  input(s): CKTOTAL, CKMB, CKMBINDEX, TROPONINI in the last 168 hours. BNP (last 3 results) No results for input(s): PROBNP in the last 8760 hours. HbA1C: No results for input(s): HGBA1C in the last 72 hours. CBG: No results for input(s): GLUCAP in the last 168 hours. Lipid Profile: No results for input(s): CHOL, HDL, LDLCALC, TRIG, CHOLHDL, LDLDIRECT in the last 72 hours. Thyroid Function Tests: No results for input(s): TSH, T4TOTAL,  FREET4, T3FREE, THYROIDAB in the last 72 hours. Anemia Panel: No results for input(s): VITAMINB12, FOLATE, FERRITIN, TIBC, IRON, RETICCTPCT in the last 72 hours. Sepsis Labs:  Recent Labs Lab 04/21/2017 1446 05/04/2017 1817 04/19/2017 2056 05/04/17 1151 05/06/17 0608  PROCALCITON  --   --   --  0.88 0.75  LATICACIDVEN 2.15* 1.5 2.4*  --   --     Recent Results (from the past 240 hour(s))  Blood culture (routine x 2)     Status: None (Preliminary result)   Collection Time: 04/24/2017  2:20 PM  Result Value Ref Range Status   Specimen Description BLOOD LEFT ANTECUBITAL  Final   Special Requests   Final    BOTTLES DRAWN AEROBIC AND ANAEROBIC Blood Culture adequate volume   Culture NO GROWTH 4 DAYS  Final   Report Status PENDING  Incomplete  Blood culture (routine x 2)     Status: None (Preliminary result)   Collection Time: 04/17/2017  2:25 PM  Result Value Ref Range Status   Specimen Description BLOOD LEFT HAND  Final   Special Requests IN PEDIATRIC BOTTLE Blood Culture adequate volume  Final   Culture NO GROWTH 4 DAYS  Final   Report Status PENDING  Incomplete  Urine culture     Status: Abnormal   Collection Time: 04/24/2017  2:55 PM  Result Value Ref Range Status   Specimen Description URINE, CLEAN CATCH  Final   Special Requests NONE  Final   Culture MULTIPLE SPECIES PRESENT, SUGGEST RECOLLECTION (A)  Final   Report Status 05/04/2017 FINAL  Final  Respiratory Panel by PCR     Status: None   Collection Time: 04/24/2017 11:29 PM  Result Value Ref Range  Status   Adenovirus NOT DETECTED NOT DETECTED Final   Coronavirus 229E NOT DETECTED NOT DETECTED Final   Coronavirus HKU1 NOT DETECTED NOT DETECTED Final   Coronavirus NL63 NOT DETECTED NOT DETECTED Final   Coronavirus OC43 NOT DETECTED NOT DETECTED Final   Metapneumovirus NOT DETECTED NOT DETECTED Final   Rhinovirus / Enterovirus NOT DETECTED NOT DETECTED Final   Influenza A NOT DETECTED NOT DETECTED Final   Influenza B NOT DETECTED NOT DETECTED Final   Parainfluenza Virus 1 NOT DETECTED NOT DETECTED Final   Parainfluenza Virus 2 NOT DETECTED NOT DETECTED Final   Parainfluenza Virus 3 NOT DETECTED NOT DETECTED Final   Parainfluenza Virus 4 NOT DETECTED NOT DETECTED Final   Respiratory Syncytial Virus NOT DETECTED NOT DETECTED Final   Bordetella pertussis NOT DETECTED NOT DETECTED Final   Chlamydophila pneumoniae NOT DETECTED NOT DETECTED Final   Mycoplasma pneumoniae NOT DETECTED NOT DETECTED Final  Body fluid culture     Status: None   Collection Time: 05/03/17  2:10 AM  Result Value Ref Range Status   Specimen Description PLEURAL RIGHT  Final   Special Requests NONE  Final   Gram Stain NO WBC SEEN NO ORGANISMS SEEN   Final   Culture NO GROWTH 3 DAYS  Final   Report Status 05/06/2017 FINAL  Final    Radiology Studies: Dg Chest 1 View  Result Date: 05/06/2017 CLINICAL DATA:  Post right-sided thoracentesis. EXAM: CHEST 1 VIEW COMPARISON:  05/06/2017; 05/05/2017 FINDINGS: Interval development of a small presumably ex vacuo pneumothorax post right-sided thoracentesis. Near complete resolution of right-sided pleural fluid. Grossly unchanged cardiac silhouette and mediastinal contours. The pulmonary vasculature appears indistinct with cephalization of flow. Worsening left basilar/ retrocardiac opacities. No acute osseus abnormalities. IMPRESSION: 1. Interval development of a  small presumably ex vacuo pneumothorax post right-sided thoracentesis. 2. Near complete resolution of right-sided  pleural effusion post thoracentesis. 3. Similar findings of pulmonary edema with worsening left basilar opacities, atelectasis versus infiltrate Patient was evaluated by the dictating interventional radiologist prior to transfer back to the floor and patient was NOT in respiratory distress and rather reported improvement in her preprocedural shortness of breath. Above findings were discussed with ordering physician, Dr. Marland Mcalpine. Electronically Signed   By: Simonne Come M.D.   On: 05/06/2017 14:34   Dg Chest 1 View  Result Date: 05/06/2017 CLINICAL DATA:  Shortness of breath. EXAM: CHEST 1 VIEW COMPARISON:  05/05/2017 FINDINGS: The cardiomediastinal silhouette is unchanged. A moderate to large right pleural effusion appears slightly smaller. Left greater than right lung airspace opacities have not significantly changed. No sizable left pleural effusion or pneumothorax is identified. IMPRESSION: 1. Moderate to large right pleural effusion, slightly decreased in size. 2. Unchanged bilateral airspace disease. Electronically Signed   By: Sebastian Ache M.D.   On: 05/06/2017 08:14   Dg Chest Port 1 View  Result Date: 05/05/2017 CLINICAL DATA:  Shortness of breath, pneumonia and pulmonary edema. EXAM: PORTABLE CHEST 1 VIEW COMPARISON:  05/04/2017 and prior radiographs FINDINGS: Cardiomediastinal silhouette is unchanged. Bilateral airspace opacities, left-greater-than-right, are stable low minimally increased. A moderate to large right pleural effusion is again noted. There is no evidence of pneumothorax. IMPRESSION: Stable to minimal increase in bilateral airspace opacities, left-greater-than-right. Unchanged moderate to large right pleural effusion. Electronically Signed   By: Harmon Pier M.D.   On: 05/05/2017 08:04   US Thoracentesis Asp Pleural Space W/img Guide  Result Date: 05/06/2017 INDICATION: History of alcoholic cirrhosis, now with recurrent symptomatic right-sided pleural effusions. Please perform  ultrasound-guided thoracentesis for therapeutic purposes. EXAM: US THORACENTESIS ASP PLEURAL SPACE W/IMG GUIDE COMPARISON:  Chest radiograph - earlier same day ; ultrasound-guided thoracentesis - 03/19/2017; 02/15/2017; 01/20/2017 MEDICATIONS: None. COMPLICATIONS: SIR Level A - No therapy, no consequence. Procedure complicated by development of a moderate size ex vacuo pneumothorax. Patient was evaluated from the procedure reported improvement in her preprocedural shortness of breath. TECHNIQUE: Informed written consent was obtained from the patient after a discussion of the risks, benefits and alternatives to treatment. A timeout was performed prior to the initiation of the procedure. Initial ultrasound scanning demonstrates a large anechoic right-sided pleural effusion. The lower chest was prepped and draped in the usual sterile fashion. 1% lidocaine was used for local anesthesia. An ultrasound image was saved for documentation purposes. An 8 Fr Safe-T-Centesis catheter was introduced. The thoracentesis was performed. The catheter was removed and a dressing was applied. The patient tolerated the procedure well without immediate post procedural complication. The patient was escorted to have an upright chest radiograph. FINDINGS: A total of approximately 1.4 liters of serous fluid was removed. IMPRESSION: Successful ultrasound-guided right sided thoracentesis yielding 1.4 liters of pleural fluid. Electronically Signed   By: Simonne Come M.D.   On: 05/06/2017 14:44   Scheduled Meds: . escitalopram  20 mg Oral Daily  . feeding supplement (ENSURE ENLIVE)  237 mL Oral BID BM  . ferrous sulfate  325 mg Oral BID WC  . furosemide  40 mg Oral Daily  . [START ON 05/07/2017] lactulose  20 g Oral Daily  . lidocaine (PF)      . mouth rinse  15 mL Mouth Rinse BID  . methylPREDNISolone (SOLU-MEDROL) injection  40 mg Intravenous Q12H  . multivitamin with minerals  1 tablet Oral Daily  .  pantoprazole  20 mg Oral QAC  breakfast  . sodium chloride flush  3 mL Intravenous Q12H  . spironolactone  100 mg Oral Daily  . thiamine  100 mg Oral Daily   Continuous Infusions: . ceFEPime (MAXIPIME) IV Stopped (05/06/17 1511)  . vancomycin      LOS: 3 days    Merlene Laughter, DO [facility] Pager [phone]  If 7PM-7AM, please contact night-coverage [email or web address] Password [facility] 05/06/2017, 5:54 PM

## 2017-05-06 NOTE — Progress Notes (Signed)
OT Cancellation Note  Patient Details Name: Rhonda Haley MRN: [id number] DOB: [birth year]   Cancelled Treatment:    Reason Eval/Treat Not Completed: Patient at procedure or test/ unavailable. Pt off unit for thoracentesis. Will check back as able to initiate OT evaluation.   Doristine Section, MS OTR/L  Pager: [phone]   Doristine Section 05/06/2017, 1:44 PM

## 2017-05-06 NOTE — Procedures (Signed)
Pre procedural Dx: Symptomatic Pleural effusion Post procedural Dx: Same  Successful US guided right sided thoracentesis yielding 1.4 L of serous pleural fluid.    EBL: None  Complications: None immediate.  Katherina Right, MD Pager #: [phone]

## 2017-05-06 NOTE — Progress Notes (Signed)
Pharmacy Antibiotic Note  Rhonda Haley is a 51 y.o. female admitted on 2017-05-10 with pneumonia.  Pharmacy has been consulted for vancomycin dosing.  Patient afebrile overnight, wbc stable at 20. Scr normal. PCT slightly down to 0.7.  Vancomycin trough this morning is elevated at 27  Plan: 1. Reduce Vancomycin to 1g q24 hours - start tonight to allow current dosing to clear.   2. Recheck vancomycin trough early this week to ensure she is clearing this dose 3. F/u cultures, renal function and clinical course.  Height:  (172.7 cm) Weight: 115 lb 11.9 oz (52.5 kg) IBW/kg (Calculated) : 63.9  Temp (24hrs), Avg:97.7 F (36.5 C), Min:97.1 F (36.2 C), Max:98.2 F (36.8 C)   Recent Labs Lab 05/10/17 1434 05-10-17 1446 10-May-2017 1817 05/10/2017 2056 05/03/17 0209 05/04/17 0811 05/05/17 0250 05/06/17 0608 05/06/17 0953  WBC 20.7*  --   --   --  20.5* 21.8* 20.3* 20.7*  --   CREATININE 0.87  --   --   --  0.88 0.91 1.01* 0.95  --   LATICACIDVEN  --  2.15* 1.5 2.4*  --   --   --   --   --   VANCOTROUGH  --   --   --   --   --   --   --   --  27*    Estimated Creatinine Clearance: 56.8 mL/min (by C-G formula based on SCr of 0.95 mg/dL).    No Known Allergies  Antimicrobials this admission: Cefepime 9/25 >>  vancomycin 9/25 >>   Dose adjustments this admission: 9/29 Vancomycin trough 27>>reduce dose to 1000 mg q 24 hours   Microbiology results: 9/25 BCx: ngtd 9/25 UCx: ng Respiratory virus panel - negative 9/26 pleural fluid: ngtd  Thank you for allowing pharmacy to be a part of this patient's care.  Sheppard Coil PharmD., BCPS Clinical Pharmacist Pager [phone] 05/06/2017 12:21 PM

## 2017-05-06 NOTE — Progress Notes (Signed)
Notified Md to see if wanted and ammonia level.  Pt alert however periods of inapporpriate thought process.  Example rolling up covers and calls out to find things in them.  No new orders at this time.  Will continue to monitor Rhonda Haley

## 2017-05-07 ENCOUNTER — Inpatient Hospital Stay (HOSPITAL_COMMUNITY): Payer: 59

## 2017-05-07 DIAGNOSIS — J939 Pneumothorax, unspecified: Secondary | ICD-10-CM

## 2017-05-07 DIAGNOSIS — J95811 Postprocedural pneumothorax: Secondary | ICD-10-CM

## 2017-05-07 LAB — CULTURE, BLOOD (ROUTINE X 2)
CULTURE: NO GROWTH
CULTURE: NO GROWTH
SPECIAL REQUESTS: ADEQUATE
Special Requests: ADEQUATE

## 2017-05-07 LAB — CBC WITH DIFFERENTIAL/PLATELET
BAND NEUTROPHILS: 0 %
BASOS PCT: 0 %
BLASTS: 0 %
Basophils Absolute: 0 10*3/uL (ref 0.0–0.1)
EOS ABS: 0 10*3/uL (ref 0.0–0.7)
Eosinophils Relative: 0 %
HCT: 33.5 % — ABNORMAL LOW (ref 36.0–46.0)
Hemoglobin: 11.4 g/dL — ABNORMAL LOW (ref 12.0–15.0)
LYMPHS PCT: 11 %
Lymphs Abs: 1.6 10*3/uL (ref 0.7–4.0)
MCH: 34.4 pg — ABNORMAL HIGH (ref 26.0–34.0)
MCHC: 34 g/dL (ref 30.0–36.0)
MCV: 101.2 fL — ABNORMAL HIGH (ref 78.0–100.0)
METAMYELOCYTES PCT: 0 %
MONO ABS: 0.8 10*3/uL (ref 0.1–1.0)
Monocytes Relative: 6 %
Myelocytes: 0 %
NEUTROS ABS: 11.7 10*3/uL — AB (ref 1.7–7.7)
Neutrophils Relative %: 83 %
OTHER: 0 %
PROMYELOCYTES ABS: 0 %
Platelets: 84 10*3/uL — ABNORMAL LOW (ref 150–400)
RBC: 3.31 MIL/uL — ABNORMAL LOW (ref 3.87–5.11)
RDW: 15.3 % (ref 11.5–15.5)
WBC: 14.1 10*3/uL — ABNORMAL HIGH (ref 4.0–10.5)
nRBC: 0 /100 WBC

## 2017-05-07 MED ORDER — SODIUM CHLORIDE 0.9 % IV SOLN
INTRAVENOUS | Status: AC | PRN
  Administered 2017-05-07: 10 mL/h via INTRAVENOUS

## 2017-05-07 MED ORDER — FOLIC ACID 5 MG/ML IJ SOLN
1.0000 mg | Freq: Every day | INTRAMUSCULAR | Status: DC
  Administered 2017-05-07 – 2017-05-08 (×2): 1 mg via INTRAVENOUS
  Filled 2017-05-07 (×4): qty 0.2

## 2017-05-07 MED ORDER — FENTANYL CITRATE (PF) 100 MCG/2ML IJ SOLN
INTRAMUSCULAR | Status: AC
  Filled 2017-05-07: qty 2

## 2017-05-07 MED ORDER — MIDAZOLAM HCL 2 MG/2ML IJ SOLN
INTRAMUSCULAR | Status: AC | PRN
  Administered 2017-05-07: 0.5 mg via INTRAVENOUS

## 2017-05-07 MED ORDER — LIDOCAINE-EPINEPHRINE 1 %-1:100000 IJ SOLN
INTRAMUSCULAR | Status: AC
Start: 2017-05-07 — End: 2017-05-08
  Filled 2017-05-07: qty 1

## 2017-05-07 MED ORDER — LORAZEPAM 2 MG/ML IJ SOLN
2.0000 mg | INTRAMUSCULAR | Status: DC | PRN
  Administered 2017-05-14: 2 mg via INTRAVENOUS
  Administered 2017-05-15: 3 mg via INTRAVENOUS
  Filled 2017-05-07: qty 2
  Filled 2017-05-07 (×2): qty 1

## 2017-05-07 MED ORDER — MIDAZOLAM HCL 2 MG/2ML IJ SOLN
INTRAMUSCULAR | Status: AC
  Filled 2017-05-07: qty 2

## 2017-05-07 MED ORDER — LORAZEPAM 2 MG/ML IJ SOLN
2.0000 mg | Freq: Once | INTRAMUSCULAR | Status: AC
  Administered 2017-05-07: 2 mg via INTRAVENOUS
  Filled 2017-05-07: qty 1

## 2017-05-07 MED ORDER — ADULT MULTIVITAMIN W/MINERALS CH
1.0000 | ORAL_TABLET | Freq: Every day | ORAL | Status: DC
Start: 2017-05-07 — End: 2017-05-15
  Administered 2017-05-08 – 2017-05-15 (×8): 1 via ORAL
  Filled 2017-05-07 (×9): qty 1

## 2017-05-07 MED ORDER — FENTANYL CITRATE (PF) 100 MCG/2ML IJ SOLN
INTRAMUSCULAR | Status: AC | PRN
  Administered 2017-05-07: 12.5 ug via INTRAVENOUS

## 2017-05-07 MED ORDER — THIAMINE HCL 100 MG/ML IJ SOLN
100.0000 mg | Freq: Every day | INTRAMUSCULAR | Status: DC
  Administered 2017-05-07 – 2017-05-08 (×2): 100 mg via INTRAVENOUS
  Filled 2017-05-07 (×2): qty 2

## 2017-05-07 NOTE — Procedures (Signed)
Pre procedural Dx: Enlarging right sided hydropneumothorax Post procedural Dx: Same  Technically successful CT guided placed of a 10 Fr drainage catheter placement into the right pleural space yielding 550 cc of additional serous pleural fluid.    EBL: Minimal  Complications: None immediate  Katherina Right, MD Pager #: [phone]

## 2017-05-07 NOTE — Progress Notes (Signed)
OT Cancellation Note  Patient Details Name: Rhonda Haley MRN: [id number] DOB: [birth year]   Cancelled Treatment:    Reason Eval/Treat Not Completed: Fatigue/lethargy limiting ability to participate. Discussed with RN who reports pt received Ativan overnight and is lethargic and unable to participate. RN requesting OT check back later this afternoon. Will check back as able to initiate evaluation.   Doristine Section, MS OTR/L  Pager: [phone]   Mckynna Vanloan A Amer Alcindor 05/07/2017, 11:53 AM

## 2017-05-07 NOTE — Progress Notes (Signed)
PROGRESS NOTE    [name]  ZOX:[id number] DOB: 1965-12-28 DOA: 04/20/2017 PCP: Lewis Moccasin, MD   Brief Narrative: Rhonda Haley is a 51 y.o. female with medical history significant for alcoholic cirrhosis with associated ascites, recurrent right hepatic hydrothorax, chronic hyponatremia, protein calorie malnutrition and anemia. Patient had been started on low-dose prednisone (03/21/17) for acute alcoholic hepatitis in the setting of weakly positive autoimmune markers. Over the past 2 weeks prednisone has been tapered to off with last dosage on 9/24. Patient has had asymptomatic leukocytosis dating back to 8/21 where her white count was documented at 23,300 with repeat CBC on 9/12 white count 29,900. On the morning of admission patient awakened with generalized malaise, weakness, chills and subjective fevers. She was not having any abdominal pain, nausea vomiting or diarrhea. She is not had any coughing or sick contacts. She followed up with the GI office she was noted to have a low-grade temperature of 100.4, heart rate was 140, BP was 90/56. Because of the symptoms she was sent to the ER for further evaluation.   In the ER she continued with low-grade temperature and tachycardia as well as tachypnea with a low but normal blood pressure readings. Her white count was 20,700 with left shift as previous. Her lactic acid was 2.15. Her sodium was 122. Abdominal exam was benign. Chest x-ray revealed significant right pleural effusion and right basilar opacity. EDP is concerned over possible HCAP and requests evaluation for observation admission. She is not hypoxic at rest. She does report dyspnea on exertion which is chronic. She subsequently underwent emergent Thoracentesis and was transferred to SDU. She is improved and GI was consulted for further evaluation and Recc's. PCCM following for her Respiratory Failure. Patient was started back on Diuretics last night. Felt a little SOB this AM and repeat CXR  did not show any worsening of Pleural Effusion. Started Patient on IV Steroids with Solumedrol 40 mg q12h on 05/04/17 and patient improving. Repeat Thoracentesis done by Dr. Grace Isaac and patient had an ex-vacuo pneumothorax after but remained stable.   Overnight the patient appeared to be withdrawing from EtOH so she was placed on CIWA protocol and received Ativan early this AM. The Pneumothorax appeared to get larger today on repeat X-Ray so Dr. Grace Isaac of IR placed a CT Guided 10 French Drainage catheter into the Right Pleural Space yielding 550 cc of additional serous pleural fluid.   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   Pleural effusion on right 2/2 hepatic hydrothorax   Anemia   Protein calorie malnutrition (HCC)   Chronic hyponatremia   HCAP (healthcare-associated pneumonia)   Murmur, cardiac   Acute respiratory distress   Pleural effusion associated with hepatic disorder  Sepsis 2/2 presumed HCAP (healthcare-associated pneumonia) -Presented with less than 24 hours of fevers, chills, generalized weakness tachycardia and tachypnea with relative hypotension -Persistent Leukocytosis since 8/21 as patient has been on Steroids and WBC now 14.1 -Mild elevation in serum lactate and went from 2.15 -> 1.5 -> 2.4 -CXR 05/04/17 showed Large right pleural effusion stable since yesterday's post thoracentesis radiograph. Diffuse alveolar opacities in the left lung are worrisome for pneumonia or pulmonary edema -CXR this AM showed Stable to minimal  increase in bilateral airspace opacities,left-greater-than-right. Unchanged moderate to large right pleural effusion. -C/w Empiric Cefepime/Vancomycin IV -Follow up on blood  cultures (NGTD at 4 Days); Urine Cx shows multiple species present  -Obtain sputum culture -Urinary Legionella and Strep Pneumo Urine Ag Negative  -Cycled lactic acid and went to 2.4 -Procalcitonin 0.88 and improved to 0.75 -Respiratory viral panel and influenza PCR Negative  -NS at 125/hr Stopped as patient became symptomatic and had yohave Thoracentesis  -Started IV Solumedrol 40 q12h -C/w Supplemental O2  Acute Hypoxic Respiratory Failure from HCAP and Pleural effusion on right 2/2 hepatic hydrothorax s/p Thoracentesis x2 and now Chest Tube -C/w Supplemental O2 and wean as tolerated -Follow Cx Data -As Above  Pleural Effusion on right 2/2 hepatic hydrothorax s/p Thoracentesis x 2 and now CT guided Chest Tube -Underwent right thoracentesis on 8/12 with 2.6 L of clear yellow fluid removed and underwent Thoracentesis on 9/26 and had 1.2 Liters removed. IVF stopped and given IV Lasix 40 mg Once -Patient reports chronic dyspnea on exertion therefore given recurrence of hydrothorax will obtain ambulatory oximetry in the event patient may benefit from home O2 in AM -PCCM Consulted and following and appreciate Recc's; Recommending continuing Abx and perform further Thoracentesis if Significant Dyspnena  -Pleural Effusion was Transudative  -Repeat Thoracentesis ordered yesterday and done today 05/06/17 and Dr. Grace Isaac Removed 1.4 Liters; Discussed with Dr. Grace Isaac as post Thoracentesis CXR showed Interval development of a small presumably ex vacuo pneumothorax post right-sided thoracentesis. Near complete resolution of right-sided pleural effusion post thoracentesis. Similar findings of pulmonary edema with worsening left basilar opacities, atelectasis versus infiltrate -Repeat CXR this AM showed Continued increase in size of moderate-sized right-sided pneumothorax, now with minimal amount of right to left mediastinal shift. Similar findings suggestive of pulmonary edema and bibasilar opacities, left  greater than right, atelectasis versus infiltrate. -A decision was made to place a CT Chest Tube by IR Radiologist Dr. Grace Isaac and patient had 550 cc of serous fluid drained some more. -Repeat CXR in AM.     Decompensated Alcoholic Cirrhosis of liver with Ascites w/weakly + autoimmune markers -Briefly seen at the GI office day of admission and then directed to the ED -Has been on Lasix and spironolactone with recent dosage adjustments -AST mildly elevated at 69 and ALT mildly elevated at 75; Recently had EtOH last week -At time of discharge dry weight documented at 122 lbs with current weight 115 lbs -Patient may be somewhat volume depleted and this could be contributing to her symptoms -Completed Prednisone taper on 9/24; Per GI will not need Steroids now as Taper was stopped -Minimal ascites and no abdominal pain so do not think presenting symptoms consistent with SBP (Canceled IR paracentesis previously ordered but made patient NPO after midnight in the event of the procedure such as thoracentesis indicated) -Gastroenterology consulted and recommending continuing Abx and resume Regular Diet with Protein Supplements -GI recommended resuming Prednisolone Taper initially but she had already completed Taper and does not need steroids for EtOH Hepatitis -Restarted Home Lasix and Spironolactone but had to be decreased to 100 mg of Aldactone and 40 mg of Lasix due to slight rise in Cr and worsening Hyponatremia -C/w Low Sodium Diet and started Lactulose 10 grams po Daily; Lactulose increased to 20 grams po BID as patient seemed slightly more confused yesterday -Per GI not rechecking Ammonia Levels  -C/w MVI, Folic Acid and Thiamine  -ECHO showed EF of 70-75% with Grade 1 DD -It appeared Patient was withdrawing last  night so she was given CIWA Protocol early this AM -C/w CIWA  Chronic Hyponatremia -Baseline appears to be around 124-125 -Current sodium 126 and repeat never drawn today -Diuretics  adjusted as above -Follow labs and repeat CMP in AM   Murmur, Cardiac -Has loud systolic murmur which was not documented during previous admission -Echocardiogram from July with normal systolic function and normal diastolic function without valvular abnormalities -Give the new murmur and fevers will obtain echocardiogram-need to follow-up on blood cultures as well -Showed EF of 60-65% -Follow up with Cardiology as an outpatient  Anemia of Chronic Disease and IDA -Hemoglobin stable and at baseline -Hb/Hct went from 9.3/28.2 -> 10.4/30.9 -> 9.2/27.9 -> 10.2/29.2 -> 11.4/33.5 -C/w Ferrous Sulfate 325 mg po BID  -Continue to Monitor for S/Sx of Bleeding  Protein Calorie Malnutrition  -Continue preadmission Ensure -Albumin is 1.8 -Nutritionist Consulted for futher reccs  Thrombocytopenia -Likely from EtOH; -Platelet Count went from 112 -> 84 -Continue to Monitor closely and repeat CBC in AM   Hyperbilirubinemia -Likely from EtOHism and Decompensated Liver Cirrhosis with Ascites -Bilirubin yesterday AM was 7.1 -Continue to Monitor and repeat CMP in AM   Aortic Valve Thickening -ECHO showed The non-coronary cusp of the aortic valve seems thickened and reduncdant. There is mild ootflow obstuction with this.  -No AI.  -Would consider TEE to further evaluate but will discuss with Cardiology at some point  Leukocytosis -Likely Reactive from Steroid Demargination -Patient's WBC was 21.8 and went to 14.1 -Continue to Treat Pneumonia -Started Patient on IV Solumedrol so will need to watch CBC closely -Repeat CBC with Diff in AM  DVT prophylaxis: SCDs given Anemia and Thrombocytopenia  Code Status: FULL CODE Family Communication: No Family present at bedside  Disposition Plan: Remain in SDU for now  Consultants:   PCCM  Gastroenterology   IR Dr. Grace Isaac  Procedures: Thoracentesis with removal of 1.2 Liters of Fluid 9/26  ECHOCARDIOGRAM Study Conclusions  - Left  ventricle: The cavity size was normal. Wall thickness was   normal. Systolic function was hyperdynamic. The estimated   ejection fraction was in the range of 70% to 75%. Wall motion was   normal; there were no regional wall motion abnormalities. Doppler   parameters are consistent with abnormal left ventricular   relaxation (grade 1 diastolic dysfunction). - Aortic valve: The non-coronary cusp of the aortic valve seems   thickened and reduncdant. There is mild ootflow obstuction with   this. No AI. Would consider TEE to further evalaute. Valve area   (VTI): 1.72 cm^2. Valve area (Vmax): 1.78 cm^2. Valve area   (Vmean): 1.82 cm^2. - Mitral valve: Valve area by pressure half-time: 2.18 cm^2.  U/S Guided Right Side Thoracentesis yielding 1.4 Liters of Serous Pleural Fluid on 9/29 done by Dr. Grace Isaac.   CT Guided Chest Tube on 9/30 for Pneumothorax   Antimicrobials: Anti-infectives    Start     Dose/Rate Route Frequency Ordered Stop   05/06/17 2200  vancomycin (VANCOCIN) IVPB 1000 mg/200 mL premix     1,000 mg 200 mL/hr over 60 Minutes Intravenous Every 24 hours 05/06/17 1224     05/23/17 2200  ceFEPIme (MAXIPIME) 1 g in dextrose 5 % 50 mL IVPB     1 g 100 mL/hr over 30 Minutes Intravenous Every 8 hours 2017/05/23 2055 05/10/17 2159   05/23/2017 2130  vancomycin (VANCOCIN) IVPB 750 mg/150 ml premix  Status:  Discontinued     750 mg 150 mL/hr over 60 Minutes Intravenous Every  12 hours 05-31-2017 2102 05/06/17 1213   May 31, 2017 1615  vancomycin (VANCOCIN) IVPB 1000 mg/200 mL premix     1,000 mg 200 mL/hr over 60 Minutes Intravenous  Once 31-May-2017 1614 May 31, 2017 1736   2017/05/31 1615  piperacillin-tazobactam (ZOSYN) IVPB 3.375 g     3.375 g 100 mL/hr over 30 Minutes Intravenous  Once May 31, 2017 1614 05/31/17 1758     Subjective: Seen and examined at bedside and was somnolent but arousable. She had just gotten ativan earlier. Would open her eyes to verbal commands but fall back to sleep. No family  at bedside.    Objective: Vitals:   05/07/17 1523 05/07/17 1620 05/07/17 1800 05/07/17 1916  BP: (!) 95/53  (!) 92/50 (!) 97/53  Pulse: 91  87 85  Resp: (!) 25  (!) 26 (!) 26  Temp:  (!) 97.5 F (36.4 C)    TempSrc:  Axillary    SpO2: 100%  100% 100%  Weight:      Height:        Intake/Output Summary (Last 24 hours) at 05/07/17 2059 Last data filed at 05/07/17 1916  Gross per 24 hour  Intake           544.59 ml  Output             1101 ml  Net          -556.41 ml   Filed Weights   05/04/17 0530 05/05/17 0430 05/07/17 0400  Weight: 52.9 kg (116 lb 10 oz) 52.5 kg (115 lb 11.9 oz) 48.2 kg (106 lb 4.2 oz)   Examination: Physical Exam:  Constitutional: Chronically ill appearing frial jaundiced Caucasian Woman resting in NAD Eyes: Sclerae are icteric. Lids normal ENMT: MMM. Grossly normal hearing Neck: Supple with no JVD.  Respiratory: Diminished with unlabored breathing and scattered crackles. Patient was slightly tachypenic  Cardiovascular: RRR; Loud Systolic Murmur. No edema Abdomen: Soft, NT, ND. Bowel sounds present GU: Deferred Musculoskeletal: No contractures. No cyanosis Skin: Warm and dry. Has scattered telangiectasias on chest wall and is jaundiced Neurologic: CN 2-12 grossly intact. No appreciable focal deficdits Psychiatric: Somnolent. Impaired judgement and insight  Data Reviewed: I have personally reviewed following labs and imaging studies  CBC:  Recent Labs Lab 05/31/17 1434 05/03/17 0209 05/04/17 0811 05/05/17 0250 05/06/17 0608 05/07/17 0924  WBC 20.7* 20.5* 21.8* 20.3* 20.7* 14.1*  NEUTROABS 17.6*  --  19.0* 19.2* 18.3* 11.7*  HGB 9.1* 9.3* 10.4* 9.2* 10.2* 11.4*  HCT 26.5* 28.2* 30.9* 27.9* 29.2* 33.5*  MCV 100.4* 101.1* 100.3* 100.0 98.6 101.2*  PLT 159 120* PLATELET CLUMPS NOTED ON SMEAR, UNABLE TO ESTIMATE 113* 112* 84*   Basic Metabolic Panel:  Recent Labs Lab 05-31-17 1434 05/03/17 0209 05/04/17 0811 05/05/17 0250 05/06/17 0608    NA 122* 123* 124* 121* 126*  K 4.7 4.1 4.7 4.7 4.9  CL 90* 93* 94* 92* 95*  CO2 21* 20* 17* 23 18*  GLUCOSE 78 103* 87 215* 127*  BUN 18 13 17  25* 35*  CREATININE 0.87 0.88 0.91 1.01* 0.95  CALCIUM 8.7* 8.2* 8.4* 8.6* 9.5  MG  --   --  1.8 1.9 2.2  PHOS  --   --  4.5 3.9 3.8   GFR: Estimated Creatinine Clearance: 52.1 mL/min (by C-G formula based on SCr of 0.95 mg/dL). Liver Function Tests:  Recent Labs Lab 2017/05/31 1434 05/03/17 0209 05/04/17 0811 05/05/17 0250 05/06/17 0608  AST 80* 78* 81* 62* 69*  ALT 95* 83* 78*  67* 75*  ALKPHOS 96 88 89 102 114  BILITOT 7.4* 7.0* 9.6* 8.0* 7.1*  PROT 6.7 6.6 6.7 6.3* 7.1  ALBUMIN 2.2* 2.0* 2.0* 1.8* 2.0*   No results for input(s): LIPASE, AMYLASE in the last 168 hours.  Recent Labs Lab 04/29/2017 1448 05/04/17 0811  AMMONIA 31 43*   Coagulation Profile:  Recent Labs Lab 04/28/2017 1434 05/03/17 0209 05/04/17 0811 05/06/17 0608  INR 1.38 1.56 1.68 1.55   Cardiac Enzymes: No results for input(s): CKTOTAL, CKMB, CKMBINDEX, TROPONINI in the last 168 hours. BNP (last 3 results) No results for input(s): PROBNP in the last 8760 hours. HbA1C: No results for input(s): HGBA1C in the last 72 hours. CBG: No results for input(s): GLUCAP in the last 168 hours. Lipid Profile: No results for input(s): CHOL, HDL, LDLCALC, TRIG, CHOLHDL, LDLDIRECT in the last 72 hours. Thyroid Function Tests: No results for input(s): TSH, T4TOTAL, FREET4, T3FREE, THYROIDAB in the last 72 hours. Anemia Panel: No results for input(s): VITAMINB12, FOLATE, FERRITIN, TIBC, IRON, RETICCTPCT in the last 72 hours. Sepsis Labs:  Recent Labs Lab 04/10/2017 1446 05/01/2017 1817 05/01/2017 2056 05/04/17 1151 05/06/17 0608  PROCALCITON  --   --   --  0.88 0.75  LATICACIDVEN 2.15* 1.5 2.4*  --   --     Recent Results (from the past 240 hour(s))  Blood culture (routine x 2)     Status: None   Collection Time: 04/09/2017  2:20 PM  Result Value Ref Range Status    Specimen Description BLOOD LEFT ANTECUBITAL  Final   Special Requests   Final    BOTTLES DRAWN AEROBIC AND ANAEROBIC Blood Culture adequate volume   Culture NO GROWTH 5 DAYS  Final   Report Status 05/07/2017 FINAL  Final  Blood culture (routine x 2)     Status: None   Collection Time: 05/03/2017  2:25 PM  Result Value Ref Range Status   Specimen Description BLOOD LEFT HAND  Final   Special Requests IN PEDIATRIC BOTTLE Blood Culture adequate volume  Final   Culture NO GROWTH 5 DAYS  Final   Report Status 05/07/2017 FINAL  Final  Urine culture     Status: Abnormal   Collection Time: 04/25/2017  2:55 PM  Result Value Ref Range Status   Specimen Description URINE, CLEAN CATCH  Final   Special Requests NONE  Final   Culture MULTIPLE SPECIES PRESENT, SUGGEST RECOLLECTION (A)  Final   Report Status 05/04/2017 FINAL  Final  Respiratory Panel by PCR     Status: None   Collection Time: 04/10/2017 11:29 PM  Result Value Ref Range Status   Adenovirus NOT DETECTED NOT DETECTED Final   Coronavirus 229E NOT DETECTED NOT DETECTED Final   Coronavirus HKU1 NOT DETECTED NOT DETECTED Final   Coronavirus NL63 NOT DETECTED NOT DETECTED Final   Coronavirus OC43 NOT DETECTED NOT DETECTED Final   Metapneumovirus NOT DETECTED NOT DETECTED Final   Rhinovirus / Enterovirus NOT DETECTED NOT DETECTED Final   Influenza A NOT DETECTED NOT DETECTED Final   Influenza B NOT DETECTED NOT DETECTED Final   Parainfluenza Virus 1 NOT DETECTED NOT DETECTED Final   Parainfluenza Virus 2 NOT DETECTED NOT DETECTED Final   Parainfluenza Virus 3 NOT DETECTED NOT DETECTED Final   Parainfluenza Virus 4 NOT DETECTED NOT DETECTED Final   Respiratory Syncytial Virus NOT DETECTED NOT DETECTED Final   Bordetella pertussis NOT DETECTED NOT DETECTED Final   Chlamydophila pneumoniae NOT DETECTED NOT DETECTED Final  Mycoplasma pneumoniae NOT DETECTED NOT DETECTED Final  Body fluid culture     Status: None   Collection Time: 05/03/17   2:10 AM  Result Value Ref Range Status   Specimen Description PLEURAL RIGHT  Final   Special Requests NONE  Final   Gram Stain NO WBC SEEN NO ORGANISMS SEEN   Final   Culture NO GROWTH 3 DAYS  Final   Report Status 05/06/2017 FINAL  Final    Radiology Studies: Dg Chest 1 View  Result Date: 05/06/2017 CLINICAL DATA:  Post right-sided thoracentesis. EXAM: CHEST 1 VIEW COMPARISON:  05/06/2017; 05/05/2017 FINDINGS: Interval development of a small presumably ex vacuo pneumothorax post right-sided thoracentesis. Near complete resolution of right-sided pleural fluid. Grossly unchanged cardiac silhouette and mediastinal contours. The pulmonary vasculature appears indistinct with cephalization of flow. Worsening left basilar/ retrocardiac opacities. No acute osseus abnormalities. IMPRESSION: 1. Interval development of a small presumably ex vacuo pneumothorax post right-sided thoracentesis. 2. Near complete resolution of right-sided pleural effusion post thoracentesis. 3. Similar findings of pulmonary edema with worsening left basilar opacities, atelectasis versus infiltrate Patient was evaluated by the dictating interventional radiologist prior to transfer back to the floor and patient was NOT in respiratory distress and rather reported improvement in her preprocedural shortness of breath. Above findings were discussed with ordering physician, Dr. Marland Mcalpine. Electronically Signed   By: Simonne Come M.D.   On: 05/06/2017 14:34   Dg Chest 1 View  Result Date: 05/06/2017 CLINICAL DATA:  Shortness of breath. EXAM: CHEST 1 VIEW COMPARISON:  05/05/2017 FINDINGS: The cardiomediastinal silhouette is unchanged. A moderate to large right pleural effusion appears slightly smaller. Left greater than right lung airspace opacities have not significantly changed. No sizable left pleural effusion or pneumothorax is identified. IMPRESSION: 1. Moderate to large right pleural effusion, slightly decreased in size. 2. Unchanged  bilateral airspace disease. Electronically Signed   By: Sebastian Ache M.D.   On: 05/06/2017 08:14   Dg Chest Port 1 View  Result Date: 05/07/2017 CLINICAL DATA:  Right-sided pneumothorax. EXAM: PORTABLE CHEST 1 VIEW COMPARISON:  Chest radiograph - earlier same day; 05/06/2017; 05/04/2017 FINDINGS: Grossly unchanged cardiac silhouette and mediastinal contours with atherosclerotic plaque within the thoracic aorta. There has been continued increase in size of right-sided pneumothorax, now with minimal amount of right to left mediastinal shift. Pulmonary vasculature remains indistinct with cephalization of flow. Unchanged small layering right-sided effusion. No acute osseus abnormalities. IMPRESSION: 1. Continued increase in size of moderate-sized right-sided pneumothorax, now with minimal amount of right to left mediastinal shift. 2. Similar findings suggestive of pulmonary edema and bibasilar opacities, left greater than right, atelectasis versus infiltrate. PLAN: Given suspected continued increase in size of right-sided pneumothorax, the decision was made to proceed with CT-guided chest tube placement. Electronically Signed   By: Simonne Come M.D.   On: 05/07/2017 14:07   Dg Chest Port 1 View  Result Date: 05/07/2017 CLINICAL DATA:  Post right-sided thoracentesis with ex vacuo pneumothorax. EXAM: PORTABLE CHEST 1 VIEW COMPARISON:  05/06/2017; 05/05/2017; 05/04/2017 FINDINGS: Grossly unchanged cardiac silhouette and mediastinal contours with atherosclerotic plaque within the thoracic aorta. Suspected increase in size of now moderate size right-sided pneumothorax with slight differences potentially accentuated by kyphosis and rotation. Pulmonary vasculature remains indistinct with cephalization of flow. Perihilar opacities are unchanged in favored to represent atelectasis. No new focal airspace opacities. No acute osseus abnormalities. IMPRESSION: 1. Suspected slight increase in now moderate sized right-sided  pneumothorax with slight differences potentially accentuated due to patient rotation.  Again, this pneumothorax could be an ex vacuo pneumothorax in the setting of recent large volume thoracentesis though clinical correlation is advised. Continued attention on follow-up is recommended. 2. Otherwise, similar findings of pulmonary edema and bibasilar opacities, left greater than right, atelectasis versus infiltrate. Critical Value/emergent results were called by telephone at the time of interpretation on 05/07/2017 at 12:29 pm to Dr. Marguerita Merles , who verbally acknowledged these results. Electronically Signed   By: Simonne Come M.D.   On: 05/07/2017 12:29   Ct Perc Pleural Drain W/indwell Cath W/img Guide  Result Date: 05/07/2017 INDICATION: Enlarging right-sided pneumothorax post thoracentesis. Please perform CT-guided chest tube placement. EXAM: CT PERC PLEURAL DRAIN W/INDWELL CATH W/IMG GUIDE COMPARISON:  Chest radiograph - earlier same day; 05/06/2017; 05/05/2017 MEDICATIONS: The patient is currently admitted to the hospital and receiving intravenous antibiotics. The antibiotics were administered within an appropriate time frame prior to the initiation of the procedure. ANESTHESIA/SEDATION: Moderate (conscious) sedation was employed during this procedure. A total of Versed 0.5 mg and Fentanyl 12.5 mcg was administered intravenously. Moderate Sedation Time: 18 minutes. The patient's level of consciousness and vital signs were monitored continuously by radiology nursing throughout the procedure under my direct supervision. CONTRAST:  None COMPLICATIONS: None immediate. PROCEDURE: Informed written consent was obtained from the patient after a discussion of the risks, benefits and alternatives to treatment. The patient was placed supine on the CT gantry and a pre procedural CT was performed re-demonstrating the known right-sided hydropneumothorax. The procedure was planned. A timeout was performed prior to the  initiation of the procedure. The skin overlying the ventral aspect the right upper chest was prepped and draped in the usual sterile fashion. The overlying soft tissues were anesthetized with 1% lidocaine with epinephrine. Appropriate trajectory was planned with the use of a 22 gauge spinal needle. An 18 gauge trocar needle was advanced into the right pleural space and a short Amplatz super stiff wire was coiled within the right pleural space. Appropriate positioning was confirmed with a limited CT scan. The tract was serially dilated allowing placement of a 10 [place] all-purpose drainage catheter. Appropriate positioning was confirmed with a limited postprocedural CT scan. Approximately 500 cc of serous fluid was aspirated following percutaneous drainage catheter placement. The tube was connected to a pleural vac device and sutured in place. A dressing was placed. The patient tolerated the procedure well without immediate post procedural complication. IMPRESSION: Successful CT guided placement of a 10 French all purpose drain catheter into the right pleural space for enlarging right-sided hydropneumothorax. Electronically Signed   By: Simonne Come M.D.   On: 05/07/2017 15:42   US Thoracentesis Asp Pleural Space W/img Guide  Result Date: 05/06/2017 INDICATION: History of alcoholic cirrhosis, now with recurrent symptomatic right-sided pleural effusions. Please perform ultrasound-guided thoracentesis for therapeutic purposes. EXAM: US THORACENTESIS ASP PLEURAL SPACE W/IMG GUIDE COMPARISON:  Chest radiograph - earlier same day ; ultrasound-guided thoracentesis - 03/19/2017; 02/15/2017; 01/20/2017 MEDICATIONS: None. COMPLICATIONS: SIR Level A - No therapy, no consequence. Procedure complicated by development of a moderate size ex vacuo pneumothorax. Patient was evaluated from the procedure reported improvement in her preprocedural shortness of breath. TECHNIQUE: Informed written consent was obtained from the patient  after a discussion of the risks, benefits and alternatives to treatment. A timeout was performed prior to the initiation of the procedure. Initial ultrasound scanning demonstrates a large anechoic right-sided pleural effusion. The lower chest was prepped and draped in the usual sterile fashion. 1% lidocaine was used for  local anesthesia. An ultrasound image was saved for documentation purposes. An 8 Fr Safe-T-Centesis catheter was introduced. The thoracentesis was performed. The catheter was removed and a dressing was applied. The patient tolerated the procedure well without immediate post procedural complication. The patient was escorted to have an upright chest radiograph. FINDINGS: A total of approximately 1.4 liters of serous fluid was removed. IMPRESSION: Successful ultrasound-guided right sided thoracentesis yielding 1.4 liters of pleural fluid. Electronically Signed   By: Simonne Come M.D.   On: 05/06/2017 14:44   Scheduled Meds: . escitalopram  20 mg Oral Daily  . feeding supplement (ENSURE ENLIVE)  237 mL Oral BID BM  . fentaNYL      . ferrous sulfate  325 mg Oral BID WC  . folic acid  1 mg Intravenous Daily  . furosemide  40 mg Oral Daily  . lactulose  20 g Oral BID  . lidocaine-EPINEPHrine      . mouth rinse  15 mL Mouth Rinse BID  . methylPREDNISolone (SOLU-MEDROL) injection  40 mg Intravenous Q12H  . midazolam      . multivitamin with minerals  1 tablet Oral Daily  . pantoprazole  20 mg Oral QAC breakfast  . sodium chloride flush  3 mL Intravenous Q12H  . spironolactone  100 mg Oral Daily  . thiamine  100 mg Intravenous Daily   Continuous Infusions: . ceFEPime (MAXIPIME) IV 1 g (05/07/17 2046)  . vancomycin 1,000 mg (05/07/17 2100)    LOS: 4 days    Merlene Laughter, DO [facility] Pager [phone]  If 7PM-7AM, please contact night-coverage [email or web address] Password [email or web address] 05/07/2017, 8:59 PM

## 2017-05-07 NOTE — Progress Notes (Signed)
Notified Md.  Pt going through withdrawals.  Needs CIWA. Started.  Did CIWA scale = 18.  Pt restless, tremors, anxious, pins and needles.  Will continue to monitor. Karena Addison T

## 2017-05-08 ENCOUNTER — Inpatient Hospital Stay (HOSPITAL_COMMUNITY): Payer: 59

## 2017-05-08 DIAGNOSIS — E875 Hyperkalemia: Secondary | ICD-10-CM

## 2017-05-08 DIAGNOSIS — N289 Disorder of kidney and ureter, unspecified: Secondary | ICD-10-CM

## 2017-05-08 DIAGNOSIS — N179 Acute kidney failure, unspecified: Secondary | ICD-10-CM

## 2017-05-08 LAB — CBC WITH DIFFERENTIAL/PLATELET
BAND NEUTROPHILS: 0 %
BASOS ABS: 0 10*3/uL (ref 0.0–0.1)
Basophils Relative: 0 %
Blasts: 0 %
EOS PCT: 0 %
Eosinophils Absolute: 0 10*3/uL (ref 0.0–0.7)
HEMATOCRIT: 34.3 % — AB (ref 36.0–46.0)
Hemoglobin: 11.7 g/dL — ABNORMAL LOW (ref 12.0–15.0)
LYMPHS ABS: 1.6 10*3/uL (ref 0.7–4.0)
Lymphocytes Relative: 12 %
MCH: 34.6 pg — ABNORMAL HIGH (ref 26.0–34.0)
MCHC: 34.1 g/dL (ref 30.0–36.0)
MCV: 101.5 fL — ABNORMAL HIGH (ref 78.0–100.0)
METAMYELOCYTES PCT: 0 %
MONO ABS: 1.2 10*3/uL — AB (ref 0.1–1.0)
MYELOCYTES: 0 %
Monocytes Relative: 9 %
NRBC: 0 /100{WBCs}
Neutro Abs: 10.5 10*3/uL — ABNORMAL HIGH (ref 1.7–7.7)
Neutrophils Relative %: 79 %
Other: 0 %
Platelets: DECREASED 10*3/uL (ref 150–400)
Promyelocytes Absolute: 0 %
RBC: 3.38 MIL/uL — ABNORMAL LOW (ref 3.87–5.11)
RDW: 15.4 % (ref 11.5–15.5)
Smear Review: DECREASED
WBC: 13.3 10*3/uL — ABNORMAL HIGH (ref 4.0–10.5)

## 2017-05-08 LAB — COMPREHENSIVE METABOLIC PANEL
ALK PHOS: 106 U/L (ref 38–126)
ALT: 74 U/L — AB (ref 14–54)
AST: 63 U/L — ABNORMAL HIGH (ref 15–41)
Albumin: 2 g/dL — ABNORMAL LOW (ref 3.5–5.0)
Anion gap: 11 (ref 5–15)
BUN: 57 mg/dL — ABNORMAL HIGH (ref 6–20)
CALCIUM: 9.8 mg/dL (ref 8.9–10.3)
CHLORIDE: 100 mmol/L — AB (ref 101–111)
CO2: 18 mmol/L — ABNORMAL LOW (ref 22–32)
CREATININE: 1.15 mg/dL — AB (ref 0.44–1.00)
GFR calc Af Amer: >60 mL/min (ref >60)
GFR, EST NON AFRICAN AMERICAN: 53 mL/min — AB (ref >60)
Glucose, Bld: 143 mg/dL — ABNORMAL HIGH (ref 65–99)
Potassium: 5.8 mmol/L — ABNORMAL HIGH (ref 3.5–5.1)
Sodium: 129 mmol/L — ABNORMAL LOW (ref 135–145)
Total Bilirubin: 8 mg/dL — ABNORMAL HIGH (ref 0.3–1.2)
Total Protein: 6.8 g/dL (ref 6.5–8.1)

## 2017-05-08 LAB — MAGNESIUM: Magnesium: 2.6 mg/dL — ABNORMAL HIGH (ref 1.7–2.4)

## 2017-05-08 LAB — PHOSPHORUS: PHOSPHORUS: 6 mg/dL — AB (ref 2.5–4.6)

## 2017-05-08 MED ORDER — RIFAXIMIN 550 MG PO TABS
550.0000 mg | ORAL_TABLET | Freq: Two times a day (BID) | ORAL | Status: DC
  Administered 2017-05-08 – 2017-05-15 (×14): 550 mg via ORAL
  Filled 2017-05-08 (×15): qty 1

## 2017-05-08 MED ORDER — MELATONIN 3 MG PO TABS
6.0000 mg | ORAL_TABLET | Freq: Every day | ORAL | Status: DC
  Administered 2017-05-08 – 2017-05-13 (×6): 6 mg via ORAL
  Filled 2017-05-08 (×8): qty 2

## 2017-05-08 MED ORDER — SPIRONOLACTONE 25 MG PO TABS
125.0000 mg | ORAL_TABLET | Freq: Every day | ORAL | Status: DC
  Administered 2017-05-09 – 2017-05-12 (×4): 125 mg via ORAL
  Filled 2017-05-08 (×4): qty 5

## 2017-05-08 MED ORDER — FUROSEMIDE 20 MG PO TABS
20.0000 mg | ORAL_TABLET | Freq: Once | ORAL | Status: AC
  Administered 2017-05-08: 20 mg via ORAL
  Filled 2017-05-08: qty 1

## 2017-05-08 MED ORDER — THIAMINE HCL 100 MG/ML IJ SOLN
100.0000 mg | Freq: Every day | INTRAMUSCULAR | Status: DC
  Administered 2017-05-08 – 2017-05-15 (×2): 100 mg via INTRAVENOUS
  Filled 2017-05-08: qty 1
  Filled 2017-05-08 (×2): qty 2

## 2017-05-08 MED ORDER — DEXTROSE 5 % IV SOLN
1.0000 g | Freq: Two times a day (BID) | INTRAVENOUS | Status: DC
  Administered 2017-05-09 – 2017-05-11 (×5): 1 g via INTRAVENOUS
  Filled 2017-05-08 (×5): qty 1

## 2017-05-08 MED ORDER — VITAMIN B-1 100 MG PO TABS
100.0000 mg | ORAL_TABLET | Freq: Every day | ORAL | Status: DC
  Administered 2017-05-09 – 2017-05-14 (×6): 100 mg via ORAL
  Filled 2017-05-08 (×7): qty 1

## 2017-05-08 MED ORDER — SPIRONOLACTONE 25 MG PO TABS
25.0000 mg | ORAL_TABLET | Freq: Once | ORAL | Status: AC
  Administered 2017-05-08: 25 mg via ORAL
  Filled 2017-05-08: qty 1

## 2017-05-08 MED ORDER — NON FORMULARY: 6.0000 mg | Freq: Every evening | Status: DC | PRN

## 2017-05-08 MED ORDER — SODIUM POLYSTYRENE SULFONATE 15 GM/60ML PO SUSP
30.0000 g | Freq: Once | ORAL | Status: AC
  Administered 2017-05-08: 30 g via ORAL
  Filled 2017-05-08: qty 120

## 2017-05-08 MED ORDER — FUROSEMIDE 40 MG PO TABS
60.0000 mg | ORAL_TABLET | Freq: Every day | ORAL | Status: DC
  Administered 2017-05-09 – 2017-05-12 (×4): 60 mg via ORAL
  Filled 2017-05-08 (×4): qty 1

## 2017-05-08 NOTE — Progress Notes (Signed)
PROGRESS NOTE    Rhonda Haley  ZOX:[id number] DOB: January 12, 1966 DOA: 04/30/2017 PCP: Lewis Moccasin, MD   Brief Narrative: Rhonda Haley is a 51 y.o. female with medical history significant for alcoholic cirrhosis with associated ascites, recurrent right hepatic hydrothorax, chronic hyponatremia, protein calorie malnutrition and anemia. Patient had been started on low-dose prednisone (03/21/17) for acute alcoholic hepatitis in the setting of weakly positive autoimmune markers. Over the past 2 weeks prednisone has been tapered to off with last dosage on 9/24. Patient has had asymptomatic leukocytosis dating back to 8/21 where her white count was documented at 23,300 with repeat CBC on 9/12 white count 29,900. On the morning of admission patient awakened with generalized malaise, weakness, chills and subjective fevers. She was not having any abdominal pain, nausea vomiting or diarrhea. She is not had any coughing or sick contacts. She followed up with the GI office she was noted to have a low-grade temperature of 100.4, heart rate was 140, BP was 90/56. Because of the symptoms she was sent to the ER for further evaluation.   In the ER she continued with low-grade temperature and tachycardia as well as tachypnea with a low but normal blood pressure readings. Her white count was 20,700 with left shift as previous. Her lactic acid was 2.15. Her sodium was 122. Abdominal exam was benign. Chest x-ray revealed significant right pleural effusion and right basilar opacity. EDP is concerned over possible HCAP and requests evaluation for observation admission. She is not hypoxic at rest. She does report dyspnea on exertion which is chronic. She subsequently underwent emergent Thoracentesis and was transferred to SDU. She is improved and GI was consulted for further evaluation and Recc's. PCCM following for her Respiratory Failure. Patient was started back on Diuretics last night. Felt a little SOB this AM and repeat CXR  did not show any worsening of Pleural Effusion. Started Patient on IV Steroids with Solumedrol 40 mg q12h on 05/04/17 and patient improving. Repeat Thoracentesis done by Dr. Grace Isaac and patient had an ex-vacuo pneumothorax after but remained stable.   Overnight the patient appeared to be withdrawing from EtOH so she was placed on CIWA protocol and received Ativan early this AM. The Pneumothorax appeared to get larger today on repeat X-Ray so Dr. Grace Isaac of IR placed a CT Guided 10 French Drainage catheter into the Right Pleural Space yielding 550 cc of additional serous pleural fluid.   PT evaluated and feel as if she needs SNF. Gastroenterology adding Rifaximin for Encephalopathy and Current dose of Lactulose increased. GI also increased Aldactone to 125 mg Daily ad Lasix to 60 mg po Daily.   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   Pleural effusion on right 2/2 hepatic hydrothorax   Anemia   Protein calorie malnutrition (HCC)   Chronic hyponatremia   HCAP (healthcare-associated pneumonia)   Murmur, cardiac   Acute respiratory distress   Pleural effusion associated with hepatic disorder   Hyperkalemia   AKI (acute kidney injury) (HCC)  Sepsis 2/2 presumed HCAP (healthcare-associated pneumonia) -Presented with less than 24 hours of fevers, chills, generalized weakness tachycardia and tachypnea with relative hypotension -Persistent Leukocytosis since 8/21 as patient has been on Steroids and WBC now 13.3 -  Mild elevation in serum lactate and went from 2.15 -> 1.5 -> 2.4 -CXR 05/04/17 showed Large right pleural effusion stable since yesterday's post thoracentesis radiograph. Diffuse alveolar opacities in the left lung  are worrisome for pneumonia or pulmonary edema -CXR this AM showed Stable to minimal increase in bilateral airspace opacities,left-greater-than-right. Unchanged moderate to large right pleural effusion. -C/w Empiric Cefepime/Vancomycin IV -Follow up on blood cultures (NGTD at 5 Days); Urine Cx shows multiple species present; Body Fluid Cx shows NGTD at 3 Days -Obtain sputum culture -Urinary Legionella and Strep Pneumo Urine Ag Negative  -Cycled lactic acid and went to 2.4 -Procalcitonin 0.88 and improved to 0.75 -Respiratory viral panel and influenza PCR Negative  -NS at 125/hr Stopped as patient became symptomatic and had yohave Thoracentesis  -D/C'd IV Solumedrol 40 q12h -C/w Supplemental O2  Acute Hypoxic Respiratory Failure from HCAP and Pleural effusion on right 2/2 hepatic hydrothorax s/p Thoracentesis x2 and now Chest Tube -C/w Supplemental O2 and wean as tolerated -Follow Cx Data -As Above  Pleural Effusion on right 2/2 hepatic hydrothorax s/p Thoracentesis x 2 and now CT guided Chest Tube -Underwent right thoracentesis on 8/12 with 2.6 L of clear yellow fluid removed and underwent Thoracentesis on 9/26 and had 1.2 Liters removed. IVF stopped and given IV Lasix 40 mg Once -Patient reports chronic dyspnea on exertion therefore given recurrence of hydrothorax will obtain ambulatory oximetry in the event patient may benefit from home O2 in AM -PCCM Consulted and following and appreciate Recc's; Recommending continuing Abx and perform further Thoracentesis if Significant Dyspnena  -Pleural Effusion was Transudative  -Repeat Thoracentesis ordered yesterday and done today 05/06/17 and Dr. Grace Isaac Removed 1.4 Liters; Discussed with Dr. Grace Isaac as post Thoracentesis CXR showed Interval development of a small presumably ex vacuo pneumothorax post right-sided thoracentesis. Near complete resolution of right-sided pleural effusion post thoracentesis. Similar findings of pulmonary edema with worsening  left basilar opacities, atelectasis versus infiltrate -Repeat CXR this AM showed Continued increase in size of moderate-sized right-sided pneumothorax, now with minimal amount of right to left mediastinal shift. Similar findings suggestive of pulmonary edema and bibasilar opacities, left greater than right, atelectasis versus infiltrate. -A decision was made to place a CT Chest Tube by IR Radiologist Dr. Grace Isaac and patient had 550 cc of serous fluid drained some more. -Repeat CXR this AM showed Decreased size of what continues to be a large right-sided hydropneumothorax following right-sided chest tube placement. The continues to be some evidence of mild right to left midline shift, suggesting some tension. Persistent asymmetrically distributed multifocal interstitial and airspace disease throughout all aspects of the lungs bilaterally (left greater than right), concerning for multilobar pneumonia   Decompensated Alcoholic Cirrhosis of liver with Ascites w/weakly + autoimmune markers -Briefly seen at the GI office day of admission and then directed to the ED -Has been on Lasix and spironolactone with recent dosage adjustments -AST mildly elevated at 63 and ALT mildly elevated at 74; Recently had EtOH last week -At time of discharge dry weight documented at 122 lbs with current weight 115 lbs -Patient may be somewhat volume depleted and this could be contributing to her symptoms -Completed Prednisone taper on 9/24; Per GI will not need Steroids now as Taper was stopped -Minimal ascites and no abdominal pain so do not think presenting symptoms consistent with SBP (Canceled IR paracentesis previously ordered but made patient NPO after midnight in the event of the procedure such as thoracentesis indicated) -Gastroenterology consulted and recommending continuing Abx and resume  Regular Diet with Protein Supplements -GI recommended resuming Prednisolone Taper initially but she had already completed Taper  and does not need steroids for EtOH Hepatitis -Restarted Home Lasix and Spironolactone but had to be decreased to 100 mg of Aldactone and 40 mg of Lasix due to slight rise in Cr and worsening Hyponatremia; Today Gastroenterology increased to 125 mg of Aldactone and 60 mg of Lasix  -C/w Low Sodium Diet; Lactulose increased to 20 grams po BID a few Days ago -Per now repeat Ammonia Levels  -GI aslo adding Rifaximin 550 mg po BID  -C/w MVI, Folic Acid and Thiamine  -ECHO showed EF of 70-75% with Grade 1 DD -It appeared Patient was withdrawing last night so she was given CIWA Protocol early this AM -C/w CIWA  Chronic Hyponatremia -Baseline appears to be around 124-125 -Current sodium 129 -Diuretics adjusted as above -Follow labs and repeat CMP in AM   Murmur, Cardiac -Has loud systolic murmur which was not documented during previous admission -Echocardiogram from July with normal systolic function and normal diastolic function without valvular abnormalities -Give the new murmur and fevers will obtain echocardiogram-need to follow-up on blood cultures as well -Showed EF of 60-65% -Follow up with Cardiology as an outpatient  Anemia of Chronic Disease and IDA -Hemoglobin stable and at baseline -Hb/Hct went from 9.3/28.2 -> 10.4/30.9 -> 9.2/27.9 -> 10.2/29.2 -> 11.4/33.5 -> 11.7/34.3 -C/w Ferrous Sulfate 325 mg po BID  -Continue to Monitor for S/Sx of Bleeding  Protein Calorie Malnutrition  -Continue preadmission Ensure -Albumin is 2.0 -Nutritionist Consulted for futher reccs  Thrombocytopenia -Likely from EtOH; -Platelet Count went from 112 -> 84 (Platelet Clumped today) -Continue to Monitor closely and repeat CBC in AM   Hyperbilirubinemia -Likely from EtOHism and Decompensated Liver Cirrhosis with Ascites -Bilirubin yesterday AM was 8.0 -Continue to Monitor and repeat CMP in AM   Aortic Valve Thickening -ECHO showed The non-coronary cusp of the aortic valve seems thickened  and reduncdant. There is mild ootflow obstuction with this.  -No AI.  -Would consider TEE to further evaluate but will discuss with Cardiology at some point  Leukocytosis -Likely Reactive from Steroid Demargination -Patient's WBC was 21.8 and went to 13.3 -Continue to Treat Pneumonia -D/C'd IV Solumedrol -Repeat CBC with Diff in AM  Worsening BUN/Cr and mild AKI -?Hepatorenal Syndrome or worsening from Diuretics -BUN/Cr went to 57/1.15 -Continue to Watch Carefully as GI increased Spironolactone and Lasix  Hyperkalemia -Patient's K+ was 5.8 -Given Kayexalate 30 mg x 1 -Repeat CMP in AM   DVT prophylaxis: SCDs given Anemia and Thrombocytopenia  Code Status: FULL CODE Family Communication: Discussed with Husband at bedside  Disposition Plan: Remain in SDU for now and likely SNF at D/C  Consultants:   PCCM  Gastroenterology   IR Dr. Grace Isaac  Procedures: Thoracentesis with removal of 1.2 Liters of Fluid 9/26  ECHOCARDIOGRAM Study Conclusions  - Left ventricle: The cavity size was normal. Wall thickness was   normal. Systolic function was hyperdynamic. The estimated   ejection fraction was in the range of 70% to 75%. Wall motion was   normal; there were no regional wall motion abnormalities. Doppler   parameters are consistent with abnormal left ventricular   relaxation (grade 1 diastolic dysfunction). - Aortic valve: The non-coronary cusp of the aortic valve seems   thickened and reduncdant. There is mild ootflow obstuction with   this. No AI. Would consider TEE to further evalaute. Valve area   (VTI): 1.72 cm^2. Valve area (Vmax): 1.78  cm^2. Valve area   (Vmean): 1.82 cm^2. - Mitral valve: Valve area by pressure half-time: 2.18 cm^2.  U/S Guided Right Side Thoracentesis yielding 1.4 Liters of Serous Pleural Fluid on 9/29 done by Dr. Grace Isaac.   CT Guided Chest Tube on 9/30 for Pneumothorax   Antimicrobials: Anti-infectives    Start     Dose/Rate Route Frequency  Ordered Stop   05/09/17 0800  ceFEPIme (MAXIPIME) 1 g in dextrose 5 % 50 mL IVPB     1 g 100 mL/hr over 30 Minutes Intravenous Every 12 hours 05/08/17 1543     05/08/17 1500  rifaximin (XIFAXAN) tablet 550 mg     550 mg Oral 2 times daily 05/08/17 1417     05/06/17 2200  vancomycin (VANCOCIN) IVPB 1000 mg/200 mL premix     1,000 mg 200 mL/hr over 60 Minutes Intravenous Every 24 hours 05/06/17 1224     05-23-2017 2200  ceFEPIme (MAXIPIME) 1 g in dextrose 5 % 50 mL IVPB  Status:  Discontinued     1 g 100 mL/hr over 30 Minutes Intravenous Every 8 hours 2017/05/23 2055 05/08/17 1543   2017-05-23 2130  vancomycin (VANCOCIN) IVPB 750 mg/150 ml premix  Status:  Discontinued     750 mg 150 mL/hr over 60 Minutes Intravenous Every 12 hours 2017/05/23 2102 05/06/17 1213   May 23, 2017 1615  vancomycin (VANCOCIN) IVPB 1000 mg/200 mL premix     1,000 mg 200 mL/hr over 60 Minutes Intravenous  Once 23-May-2017 1614 May 23, 2017 1736   2017/05/23 1615  piperacillin-tazobactam (ZOSYN) IVPB 3.375 g     3.375 g 100 mL/hr over 30 Minutes Intravenous  Once 05-23-2017 1614 May 23, 2017 1758     Subjective: Seen and examined at bedside and was alert and awake sitting in chair eating breakfast. Husband at bedside and had a lot of questions. Patient feels her breathing is better. No other concerns or complaints at this time.   Objective: Vitals:   05/08/17 0500 05/08/17 0757 05/08/17 1100 05/08/17 1630  BP:  (!) 103/50 131/66   Pulse:      Resp:      Temp:  97.7 F (36.5 C) (!) 97.5 F (36.4 C) 97.6 F (36.4 C)  TempSrc:  Axillary Axillary Oral  SpO2:      Weight: 48.5 kg (106 lb 14.8 oz)     Height:        Intake/Output Summary (Last 24 hours) at 05/08/17 1956 Last data filed at 05/08/17 1846  Gross per 24 hour  Intake              203 ml  Output               12 ml  Net              191 ml   Filed Weights   05/05/17 0430 05/07/17 0400 05/08/17 0500  Weight: 52.5 kg (115 lb 11.9 oz) 48.2 kg (106 lb 4.2 oz) 48.5 kg  (106 lb 14.8 oz)   Examination: Physical Exam:  Constitutional: Chronically ill appearing frail and jaundiced Caucasian female in NAD sitting in chair bedside.  Eyes: Sclerae are icteric; Lids appear normal ENMT: MMM. Grossly normal hearing. External ears and nose appear normal Neck: Supple with no JVD Respiratory: Diminished with unlabored breathing and has scattered crackles. Has right chest tube in place. Patient was slightly tachypenic Cardiovascular: RRR; Loud Systolic murmur. No appreciable LE edema Abdomen: Soft, NT, ND. Bowel sounds present GU: Deferred Musculoskeletal: No contractures;  No cyanosis. Skin: Warm and Dry. Has some scattered telangiectasias on chest wall and is jaundiced Neurologic: CN 2-12 grossly intact. No appreciable focal deficits Psychiatric: Awake and Alert. Pleasant mood and affect  Data Reviewed: I have personally reviewed following labs and imaging studies  CBC:  Recent Labs Lab 05/04/17 0811 05/05/17 0250 05/06/17 0608 05/07/17 0924 05/08/17 1023  WBC 21.8* 20.3* 20.7* 14.1* 13.3*  NEUTROABS 19.0* 19.2* 18.3* 11.7* 10.5*  HGB 10.4* 9.2* 10.2* 11.4* 11.7*  HCT 30.9* 27.9* 29.2* 33.5* 34.3*  MCV 100.3* 100.0 98.6 101.2* 101.5*  PLT PLATELET CLUMPS NOTED ON SMEAR, UNABLE TO ESTIMATE 113* 112* 84* PLATELETS APPEAR DECREASED   Basic Metabolic Panel:  Recent Labs Lab 05/03/17 0209 05/04/17 0811 05/05/17 0250 05/06/17 0608 05/08/17 1023  NA 123* 124* 121* 126* 129*  K 4.1 4.7 4.7 4.9 5.8*  CL 93* 94* 92* 95* 100*  CO2 20* 17* 23 18* 18*  GLUCOSE 103* 87 215* 127* 143*  BUN 13 17 25* 35* 57*  CREATININE 0.88 0.91 1.01* 0.95 1.15*  CALCIUM 8.2* 8.4* 8.6* 9.5 9.8  MG  --  1.8 1.9 2.2 2.6*  PHOS  --  4.5 3.9 3.8 6.0*   GFR: Estimated Creatinine Clearance: 43.3 mL/min (A) (by C-G formula based on SCr of 1.15 mg/dL (H)). Liver Function Tests:  Recent Labs Lab 05/03/17 0209 05/04/17 0811 05/05/17 0250 05/06/17 0608 05/08/17 1023    AST 78* 81* 62* 69* 63*  ALT 83* 78* 67* 75* 74*  ALKPHOS 88 89 102 114 106  BILITOT 7.0* 9.6* 8.0* 7.1* 8.0*  PROT 6.6 6.7 6.3* 7.1 6.8  ALBUMIN 2.0* 2.0* 1.8* 2.0* 2.0*   No results for input(s): LIPASE, AMYLASE in the last 168 hours.  Recent Labs Lab 04/21/2017 1448 05/04/17 0811  AMMONIA 31 43*   Coagulation Profile:  Recent Labs Lab 04/22/2017 1434 05/03/17 0209 05/04/17 0811 05/06/17 0608  INR 1.38 1.56 1.68 1.55   Cardiac Enzymes: No results for input(s): CKTOTAL, CKMB, CKMBINDEX, TROPONINI in the last 168 hours. BNP (last 3 results) No results for input(s): PROBNP in the last 8760 hours. HbA1C: No results for input(s): HGBA1C in the last 72 hours. CBG: No results for input(s): GLUCAP in the last 168 hours. Lipid Profile: No results for input(s): CHOL, HDL, LDLCALC, TRIG, CHOLHDL, LDLDIRECT in the last 72 hours. Thyroid Function Tests: No results for input(s): TSH, T4TOTAL, FREET4, T3FREE, THYROIDAB in the last 72 hours. Anemia Panel: No results for input(s): VITAMINB12, FOLATE, FERRITIN, TIBC, IRON, RETICCTPCT in the last 72 hours. Sepsis Labs:  Recent Labs Lab 04/19/2017 1446 04/15/2017 1817 04/11/2017 2056 05/04/17 1151 05/06/17 1610  PROCALCITON  --   --   --  0.88 0.75  LATICACIDVEN 2.15* 1.5 2.4*  --   --     Recent Results (from the past 240 hour(s))  Blood culture (routine x 2)     Status: None   Collection Time: 04/16/2017  2:20 PM  Result Value Ref Range Status   Specimen Description BLOOD LEFT ANTECUBITAL  Final   Special Requests   Final    BOTTLES DRAWN AEROBIC AND ANAEROBIC Blood Culture adequate volume   Culture NO GROWTH 5 DAYS  Final   Report Status 05/07/2017 FINAL  Final  Blood culture (routine x 2)     Status: None   Collection Time: 04/25/2017  2:25 PM  Result Value Ref Range Status   Specimen Description BLOOD LEFT HAND  Final   Special Requests IN PEDIATRIC BOTTLE Blood  Culture adequate volume  Final   Culture NO GROWTH 5 DAYS  Final    Report Status 05/07/2017 FINAL  Final  Urine culture     Status: Abnormal   Collection Time: 04/13/2017  2:55 PM  Result Value Ref Range Status   Specimen Description URINE, CLEAN CATCH  Final   Special Requests NONE  Final   Culture MULTIPLE SPECIES PRESENT, SUGGEST RECOLLECTION (A)  Final   Report Status 05/04/2017 FINAL  Final  Respiratory Panel by PCR     Status: None   Collection Time: 04/26/2017 11:29 PM  Result Value Ref Range Status   Adenovirus NOT DETECTED NOT DETECTED Final   Coronavirus 229E NOT DETECTED NOT DETECTED Final   Coronavirus HKU1 NOT DETECTED NOT DETECTED Final   Coronavirus NL63 NOT DETECTED NOT DETECTED Final   Coronavirus OC43 NOT DETECTED NOT DETECTED Final   Metapneumovirus NOT DETECTED NOT DETECTED Final   Rhinovirus / Enterovirus NOT DETECTED NOT DETECTED Final   Influenza A NOT DETECTED NOT DETECTED Final   Influenza B NOT DETECTED NOT DETECTED Final   Parainfluenza Virus 1 NOT DETECTED NOT DETECTED Final   Parainfluenza Virus 2 NOT DETECTED NOT DETECTED Final   Parainfluenza Virus 3 NOT DETECTED NOT DETECTED Final   Parainfluenza Virus 4 NOT DETECTED NOT DETECTED Final   Respiratory Syncytial Virus NOT DETECTED NOT DETECTED Final   Bordetella pertussis NOT DETECTED NOT DETECTED Final   Chlamydophila pneumoniae NOT DETECTED NOT DETECTED Final   Mycoplasma pneumoniae NOT DETECTED NOT DETECTED Final  Body fluid culture     Status: None   Collection Time: 05/03/17  2:10 AM  Result Value Ref Range Status   Specimen Description PLEURAL RIGHT  Final   Special Requests NONE  Final   Gram Stain NO WBC SEEN NO ORGANISMS SEEN   Final   Culture NO GROWTH 3 DAYS  Final   Report Status 05/06/2017 FINAL  Final    Radiology Studies: Dg Chest 2 View  Result Date: 05/08/2017 CLINICAL DATA:  51 year old female with history of right-sided hemothorax. Evaluate chest tube physician. EXAM: CHEST  2 VIEW COMPARISON:  Chest x-ray a 05/07/2017. FINDINGS: Interval  placement of a small bore right-sided chest tube with pigtail reformed in the upper right hemithorax. Previously noted large right-sided hydropneumothorax has decreased slightly in size, currently a occupying approximately 50% of the volume of the right hemithorax. Widespread interstitial and airspace disease again noted throughout both lungs (left greater than right). Heart size is normal. Mediastinal contours remain slightly displaced to the left, suggesting potential tension component. IMPRESSION: 1. Decreased size of what continues to be a large right-sided hydropneumothorax following right-sided chest tube placement. The continues to be some evidence of mild right to left midline shift, suggesting some tension. 2. Persistent asymmetrically distributed multifocal interstitial and airspace disease throughout all aspects of the lungs bilaterally (left greater than right), concerning for multilobar pneumonia. Electronically Signed   By: Trudie Reed M.D.   On: 05/08/2017 07:44   Dg Chest Port 1 View  Result Date: 05/07/2017 CLINICAL DATA:  Right-sided pneumothorax. EXAM: PORTABLE CHEST 1 VIEW COMPARISON:  Chest radiograph - earlier same day; 05/06/2017; 05/04/2017 FINDINGS: Grossly unchanged cardiac silhouette and mediastinal contours with atherosclerotic plaque within the thoracic aorta. There has been continued increase in size of right-sided pneumothorax, now with minimal amount of right to left mediastinal shift. Pulmonary vasculature remains indistinct with cephalization of flow. Unchanged small layering right-sided effusion. No acute osseus abnormalities. IMPRESSION: 1. Continued increase in  size of moderate-sized right-sided pneumothorax, now with minimal amount of right to left mediastinal shift. 2. Similar findings suggestive of pulmonary edema and bibasilar opacities, left greater than right, atelectasis versus infiltrate. PLAN: Given suspected continued increase in size of right-sided  pneumothorax, the decision was made to proceed with CT-guided chest tube placement. Electronically Signed   By: Simonne Come M.D.   On: 05/07/2017 14:07   Dg Chest Port 1 View  Result Date: 05/07/2017 CLINICAL DATA:  Post right-sided thoracentesis with ex vacuo pneumothorax. EXAM: PORTABLE CHEST 1 VIEW COMPARISON:  05/06/2017; 05/05/2017; 05/04/2017 FINDINGS: Grossly unchanged cardiac silhouette and mediastinal contours with atherosclerotic plaque within the thoracic aorta. Suspected increase in size of now moderate size right-sided pneumothorax with slight differences potentially accentuated by kyphosis and rotation. Pulmonary vasculature remains indistinct with cephalization of flow. Perihilar opacities are unchanged in favored to represent atelectasis. No new focal airspace opacities. No acute osseus abnormalities. IMPRESSION: 1. Suspected slight increase in now moderate sized right-sided pneumothorax with slight differences potentially accentuated due to patient rotation. Again, this pneumothorax could be an ex vacuo pneumothorax in the setting of recent large volume thoracentesis though clinical correlation is advised. Continued attention on follow-up is recommended. 2. Otherwise, similar findings of pulmonary edema and bibasilar opacities, left greater than right, atelectasis versus infiltrate. Critical Value/emergent results were called by telephone at the time of interpretation on 05/07/2017 at 12:29 pm to Dr. Marguerita Merles , who verbally acknowledged these results. Electronically Signed   By: Simonne Come M.D.   On: 05/07/2017 12:29   Ct Perc Pleural Drain W/indwell Cath W/img Guide  Result Date: 05/07/2017 INDICATION: Enlarging right-sided pneumothorax post thoracentesis. Please perform CT-guided chest tube placement. EXAM: CT PERC PLEURAL DRAIN W/INDWELL CATH W/IMG GUIDE COMPARISON:  Chest radiograph - earlier same day; 05/06/2017; 05/05/2017 MEDICATIONS: The patient is currently admitted to the  hospital and receiving intravenous antibiotics. The antibiotics were administered within an appropriate time frame prior to the initiation of the procedure. ANESTHESIA/SEDATION: Moderate (conscious) sedation was employed during this procedure. A total of Versed 0.5 mg and Fentanyl 12.5 mcg was administered intravenously. Moderate Sedation Time: 18 minutes. The patient's level of consciousness and vital signs were monitored continuously by radiology nursing throughout the procedure under my direct supervision. CONTRAST:  None COMPLICATIONS: None immediate. PROCEDURE: Informed written consent was obtained from the patient after a discussion of the risks, benefits and alternatives to treatment. The patient was placed supine on the CT gantry and a pre procedural CT was performed re-demonstrating the known right-sided hydropneumothorax. The procedure was planned. A timeout was performed prior to the initiation of the procedure. The skin overlying the ventral aspect the right upper chest was prepped and draped in the usual sterile fashion. The overlying soft tissues were anesthetized with 1% lidocaine with epinephrine. Appropriate trajectory was planned with the use of a 22 gauge spinal needle. An 18 gauge trocar needle was advanced into the right pleural space and a short Amplatz super stiff wire was coiled within the right pleural space. Appropriate positioning was confirmed with a limited CT scan. The tract was serially dilated allowing placement of a 10 [place] all-purpose drainage catheter. Appropriate positioning was confirmed with a limited postprocedural CT scan. Approximately 500 cc of serous fluid was aspirated following percutaneous drainage catheter placement. The tube was connected to a pleural vac device and sutured in place. A dressing was placed. The patient tolerated the procedure well without immediate post procedural complication. IMPRESSION: Successful CT guided placement of a  10 [place] all purpose  drain catheter into the right pleural space for enlarging right-sided hydropneumothorax. Electronically Signed   By: Simonne Come M.D.   On: 05/07/2017 15:42   Scheduled Meds: . escitalopram  20 mg Oral Daily  . feeding supplement (ENSURE ENLIVE)  237 mL Oral BID BM  . ferrous sulfate  325 mg Oral BID WC  . folic acid  1 mg Intravenous Daily  . furosemide  60 mg Oral Daily  . lactulose  20 g Oral BID  . mouth rinse  15 mL Mouth Rinse BID  . multivitamin with minerals  1 tablet Oral Daily  . pantoprazole  20 mg Oral QAC breakfast  . rifaximin  550 mg Oral BID  . sodium chloride flush  3 mL Intravenous Q12H  . sodium polystyrene  30 g Oral Once  . [START ON 05/09/2017] spironolactone  125 mg Oral Daily  . thiamine injection  100 mg Intravenous Daily   Or  . thiamine  100 mg Oral Daily   Continuous Infusions: . [START ON 05/09/2017] ceFEPime (MAXIPIME) IV    . vancomycin 1,000 mg (05/07/17 2100)    LOS: 5 days    Merlene Laughter, DO [facility] Pager [phone]  If 7PM-7AM, please contact night-coverage [email or web address] Password [email or web address] 05/08/2017, 7:56 PM

## 2017-05-08 NOTE — Progress Notes (Signed)
Pharmacy Antibiotic Note  Lailanie Hasley is a 51 y.o. female admitted on 04/11/2017 with pneumonia.  WBC trending down, afebrile, with SCr increasing from baseline.  Plan: Vancomycin 1g IV q24  Cefepime 1gm IV q8h changed to q12h  > (10/3)  F/u renal function, clinical progression, and stop date for vancomycin   Height:  (172.7 cm) Weight: 106 lb 14.8 oz (48.5 kg) IBW/kg (Calculated) : 63.9  Temp (24hrs), Avg:97.6 F (36.4 C), Min:97.5 F (36.4 C), Max:97.8 F (36.6 C)   Recent Labs Lab 04/16/2017 1446 04/21/2017 1817 05/06/2017 2056 05/03/17 0209 05/04/17 0811 05/05/17 0250 05/06/17 0608 05/06/17 0953 05/07/17 0924 05/08/17 1023  WBC  --   --   --  20.5* 21.8* 20.3* 20.7*  --  14.1* 13.3*  CREATININE  --   --   --  0.88 0.91 1.01* 0.95  --   --  1.15*  LATICACIDVEN 2.15* 1.5 2.4*  --   --   --   --   --   --   --   VANCOTROUGH  --   --   --   --   --   --   --  27*  --   --     Estimated Creatinine Clearance: 43.3 mL/min (A) (by C-G formula based on SCr of 1.15 mg/dL (H)).    No Known Allergies  Antimicrobials this admission: Cefepime 9/25 >> (10/3) Vancomycin 9/25 >> (10/3, but stop date not in place)  Dose adjustments this admission: 9/29 VT 27>>change to 1g q24h  Microbiology results: 9/25 Influenza panel negative  9/25 HIV non-reactive  9/25 blood x 2 - NGTD  9/25 urine - multiple species, suggest recollection  9/26 right pleural fluid - no organisms on gram stain, NGTD  9/?? MRSA PCR - pending  9/25 Strep pneuma Ag - negative  9/25 Legionella Ag - neg  Daylene Posey, PharmD Pharmacy Resident Pager #: [phone] 05/08/2017 3:38 PM

## 2017-05-08 NOTE — Progress Notes (Signed)
Referring Physician(s): Dr De Burrs  Supervising Physician: Jolaine Click  Patient Status:  [facility] - In-pt  Chief Complaint:  Right ptx post thoracentesis 9/29   Subjective: 9/30: Technically successful CT guided placed of a 10 Fr drainage catheter placement into the right pleural space yielding 550 cc of additional serous pleural fluid.  Feels some better Chest tube in place + air leak  IMPRESSION: 1. Decreased size of what continues to be a large right-sided hydropneumothorax following right-sided chest tube placement. The continues to be some evidence of mild right to left midline shift, suggesting some tension. 2. Persistent asymmetrically distributed multifocal interstitial and airspace disease throughout all aspects of the lungs bilaterally (left greater than right), concerning for multilobar pneumonia.  Allergies: Patient has no known allergies.  Medications: Prior to Admission medications   Medication Sig Start Date End Date Taking? Authorizing Provider  escitalopram (LEXAPRO) 20 MG tablet Take 20 mg by mouth daily. 01/18/17  Yes [provider]  feeding supplement, ENSURE ENLIVE, (ENSURE ENLIVE) LIQD Take 237 mLs by mouth 2 (two) times daily between meals. 01/21/17  Yes Ghimire, Werner Lean, MD  ferrous sulfate 325 (65 FE) MG tablet Take 1 tablet (325 mg total) by mouth 2 (two) times daily with a meal. 03/17/17  Yes Zehr, Shanda Bumps D, PA-C  furosemide (LASIX) 40 MG tablet Take 1.5 tablets (60 mg total) by mouth daily. 04/05/17  Yes Iva Boop, MD  Melatonin 10 MG TABS Take 10 mg by mouth at bedtime.   Yes [provider]  Multiple Vitamin (MULTIVITAMIN WITH MINERALS) TABS tablet Take 1 tablet by mouth daily. 03/21/17  Yes Calvert Cantor, MD  pantoprazole (PROTONIX) 20 MG tablet Take 1 tablet (20 mg total) by mouth daily before breakfast. 02/01/17  Yes Iva Boop, MD  potassium chloride SA (K-DUR,KLOR-CON) 20 MEQ tablet Take 1 tablet (20 mEq total)  by mouth daily. 02/24/17 04/22/2017 Yes Beola Cord, MD  prednisoLONE (MILLIPRED) 5 MG TABS tablet PER LAST NOTE FROM 04/25/17 YOU SHOULD BE DOING ALREADY,Taper your prednisone as follows:  starting tomorrow for 4 Days then go to  for 4 days, then  for 4 days, then  for 4 days then STOP 04/24/17  Yes Iva Boop, MD  spironolactone (ALDACTONE) 100 MG tablet Take 2 tablets (200 mg total) by mouth daily. Patient taking differently: Take 250 mg by mouth daily.  04/20/17  Yes Iva Boop, MD  thiamine (VITAMIN B-1) 100 MG tablet Take 1 tablet (100 mg total) by mouth daily. 04/20/17  Yes Iva Boop, MD     Vital Signs: BP (!) 123/46 (BP Location: Left Leg)   Pulse 75   Temp 97.7 F (36.5 C) (Axillary)   Resp (!) 29   Ht  (1.727 m)   Wt 106 lb 14.8 oz (48.5 kg)   SpO2 100%   BMI 16.26 kg/m   Physical Exam  Pulmonary/Chest: Effort normal. She has wheezes.  Abdominal: Soft.  Neurological: She is alert.  Skin: Skin is warm.  Site is clean and dry NT No bleeding -20 cm water suction 10 cc serous fluid collected in pleurvac + air leak  Nursing note and vitals reviewed.   Imaging: Dg Chest 1 View  Result Date: 05/06/2017 CLINICAL DATA:  Post right-sided thoracentesis. EXAM: CHEST 1 VIEW COMPARISON:  05/06/2017; 05/05/2017 FINDINGS: Interval development of a small presumably ex vacuo pneumothorax post right-sided thoracentesis. Near complete resolution of right-sided pleural fluid. Grossly unchanged cardiac silhouette and mediastinal  contours. The pulmonary vasculature appears indistinct with cephalization of flow. Worsening left basilar/ retrocardiac opacities. No acute osseus abnormalities. IMPRESSION: 1. Interval development of a small presumably ex vacuo pneumothorax post right-sided thoracentesis. 2. Near complete resolution of right-sided pleural effusion post thoracentesis. 3. Similar findings of pulmonary edema with worsening left basilar opacities,  atelectasis versus infiltrate Patient was evaluated by the dictating interventional radiologist prior to transfer back to the floor and patient was NOT in respiratory distress and rather reported improvement in her preprocedural shortness of breath. Above findings were discussed with ordering physician, Dr. Marland Mcalpine. Electronically Signed   By: Simonne Come M.D.   On: 05/06/2017 14:34   Dg Chest 1 View  Result Date: 05/06/2017 CLINICAL DATA:  Shortness of breath. EXAM: CHEST 1 VIEW COMPARISON:  05/05/2017 FINDINGS: The cardiomediastinal silhouette is unchanged. A moderate to large right pleural effusion appears slightly smaller. Left greater than right lung airspace opacities have not significantly changed. No sizable left pleural effusion or pneumothorax is identified. IMPRESSION: 1. Moderate to large right pleural effusion, slightly decreased in size. 2. Unchanged bilateral airspace disease. Electronically Signed   By: Sebastian Ache M.D.   On: 05/06/2017 08:14   Dg Chest 2 View  Result Date: 05/08/2017 CLINICAL DATA:  51 year old female with history of right-sided hemothorax. Evaluate chest tube physician. EXAM: CHEST  2 VIEW COMPARISON:  Chest x-ray a 05/07/2017. FINDINGS: Interval placement of a small bore right-sided chest tube with pigtail reformed in the upper right hemithorax. Previously noted large right-sided hydropneumothorax has decreased slightly in size, currently a occupying approximately 50% of the volume of the right hemithorax. Widespread interstitial and airspace disease again noted throughout both lungs (left greater than right). Heart size is normal. Mediastinal contours remain slightly displaced to the left, suggesting potential tension component. IMPRESSION: 1. Decreased size of what continues to be a large right-sided hydropneumothorax following right-sided chest tube placement. The continues to be some evidence of mild right to left midline shift, suggesting some tension. 2. Persistent  asymmetrically distributed multifocal interstitial and airspace disease throughout all aspects of the lungs bilaterally (left greater than right), concerning for multilobar pneumonia. Electronically Signed   By: Trudie Reed M.D.   On: 05/08/2017 07:44   Dg Chest Port 1 View  Result Date: 05/07/2017 CLINICAL DATA:  Right-sided pneumothorax. EXAM: PORTABLE CHEST 1 VIEW COMPARISON:  Chest radiograph - earlier same day; 05/06/2017; 05/04/2017 FINDINGS: Grossly unchanged cardiac silhouette and mediastinal contours with atherosclerotic plaque within the thoracic aorta. There has been continued increase in size of right-sided pneumothorax, now with minimal amount of right to left mediastinal shift. Pulmonary vasculature remains indistinct with cephalization of flow. Unchanged small layering right-sided effusion. No acute osseus abnormalities. IMPRESSION: 1. Continued increase in size of moderate-sized right-sided pneumothorax, now with minimal amount of right to left mediastinal shift. 2. Similar findings suggestive of pulmonary edema and bibasilar opacities, left greater than right, atelectasis versus infiltrate. PLAN: Given suspected continued increase in size of right-sided pneumothorax, the decision was made to proceed with CT-guided chest tube placement. Electronically Signed   By: Simonne Come M.D.   On: 05/07/2017 14:07   Dg Chest Port 1 View  Result Date: 05/07/2017 CLINICAL DATA:  Post right-sided thoracentesis with ex vacuo pneumothorax. EXAM: PORTABLE CHEST 1 VIEW COMPARISON:  05/06/2017; 05/05/2017; 05/04/2017 FINDINGS: Grossly unchanged cardiac silhouette and mediastinal contours with atherosclerotic plaque within the thoracic aorta. Suspected increase in size of now moderate size right-sided pneumothorax with slight differences potentially accentuated by kyphosis and  rotation. Pulmonary vasculature remains indistinct with cephalization of flow. Perihilar opacities are unchanged in favored to  represent atelectasis. No new focal airspace opacities. No acute osseus abnormalities. IMPRESSION: 1. Suspected slight increase in now moderate sized right-sided pneumothorax with slight differences potentially accentuated due to patient rotation. Again, this pneumothorax could be an ex vacuo pneumothorax in the setting of recent large volume thoracentesis though clinical correlation is advised. Continued attention on follow-up is recommended. 2. Otherwise, similar findings of pulmonary edema and bibasilar opacities, left greater than right, atelectasis versus infiltrate. Critical Value/emergent results were called by telephone at the time of interpretation on 05/07/2017 at 12:29 pm to Dr. Marguerita Merles , who verbally acknowledged these results. Electronically Signed   By: Simonne Come M.D.   On: 05/07/2017 12:29   Dg Chest Port 1 View  Result Date: 05/05/2017 CLINICAL DATA:  Shortness of breath, pneumonia and pulmonary edema. EXAM: PORTABLE CHEST 1 VIEW COMPARISON:  05/04/2017 and prior radiographs FINDINGS: Cardiomediastinal silhouette is unchanged. Bilateral airspace opacities, left-greater-than-right, are stable low minimally increased. A moderate to large right pleural effusion is again noted. There is no evidence of pneumothorax. IMPRESSION: Stable to minimal increase in bilateral airspace opacities, left-greater-than-right. Unchanged moderate to large right pleural effusion. Electronically Signed   By: Harmon Pier M.D.   On: 05/05/2017 08:04   Ct Perc Pleural Drain W/indwell Cath W/img Guide  Result Date: 05/07/2017 INDICATION: Enlarging right-sided pneumothorax post thoracentesis. Please perform CT-guided chest tube placement. EXAM: CT PERC PLEURAL DRAIN W/INDWELL CATH W/IMG GUIDE COMPARISON:  Chest radiograph - earlier same day; 05/06/2017; 05/05/2017 MEDICATIONS: The patient is currently admitted to the [facility] and receiving intravenous antibiotics. The antibiotics were administered within an  appropriate time frame prior to the initiation of the procedure. ANESTHESIA/SEDATION: Moderate (conscious) sedation was employed during this procedure. A total of Versed 0.5 mg and Fentanyl 12.5 mcg was administered intravenously. Moderate Sedation Time: 18 minutes. The patient's level of consciousness and vital signs were monitored continuously by radiology nursing throughout the procedure under my direct supervision. CONTRAST:  None COMPLICATIONS: None immediate. PROCEDURE: Informed written consent was obtained from the patient after a discussion of the risks, benefits and alternatives to treatment. The patient was placed supine on the CT gantry and a pre procedural CT was performed re-demonstrating the known right-sided hydropneumothorax. The procedure was planned. A timeout was performed prior to the initiation of the procedure. The skin overlying the ventral aspect the right upper chest was prepped and draped in the usual sterile fashion. The overlying soft tissues were anesthetized with 1% lidocaine with epinephrine. Appropriate trajectory was planned with the use of a 22 gauge spinal needle. An 18 gauge trocar needle was advanced into the right pleural space and a short Amplatz super stiff wire was coiled within the right pleural space. Appropriate positioning was confirmed with a limited CT scan. The tract was serially dilated allowing placement of a 10 [place] all-purpose drainage catheter. Appropriate positioning was confirmed with a limited postprocedural CT scan. Approximately 500 cc of serous fluid was aspirated following percutaneous drainage catheter placement. The tube was connected to a pleural vac device and sutured in place. A dressing was placed. The patient tolerated the procedure well without immediate post procedural complication. IMPRESSION: Successful CT guided placement of a 10 [place] all purpose drain catheter into the right pleural space for enlarging right-sided hydropneumothorax.  Electronically Signed   By: Simonne Come M.D.   On: 05/07/2017 15:42   US Thoracentesis Asp Pleural Space W/img  Guide  Result Date: 05/06/2017 INDICATION: History of alcoholic cirrhosis, now with recurrent symptomatic right-sided pleural effusions. Please perform ultrasound-guided thoracentesis for therapeutic purposes. EXAM: US THORACENTESIS ASP PLEURAL SPACE W/IMG GUIDE COMPARISON:  Chest radiograph - earlier same day ; ultrasound-guided thoracentesis - 03/19/2017; 02/15/2017; 01/20/2017 MEDICATIONS: None. COMPLICATIONS: SIR Level A - No therapy, no consequence. Procedure complicated by development of a moderate size ex vacuo pneumothorax. Patient was evaluated from the procedure reported improvement in her preprocedural shortness of breath. TECHNIQUE: Informed written consent was obtained from the patient after a discussion of the risks, benefits and alternatives to treatment. A timeout was performed prior to the initiation of the procedure. Initial ultrasound scanning demonstrates a large anechoic right-sided pleural effusion. The lower chest was prepped and draped in the usual sterile fashion. 1% lidocaine was used for local anesthesia. An ultrasound image was saved for documentation purposes. An 8 Fr Safe-T-Centesis catheter was introduced. The thoracentesis was performed. The catheter was removed and a dressing was applied. The patient tolerated the procedure well without immediate post procedural complication. The patient was escorted to have an upright chest radiograph. FINDINGS: A total of approximately 1.4 liters of serous fluid was removed. IMPRESSION: Successful ultrasound-guided right sided thoracentesis yielding 1.4 liters of pleural fluid. Electronically Signed   By: Simonne Come M.D.   On: 05/06/2017 14:44    Labs:  CBC:  Recent Labs  05/04/17 0811 05/05/17 0250 05/06/17 0608 05/07/17 0924  WBC 21.8* 20.3* 20.7* 14.1*  HGB 10.4* 9.2* 10.2* 11.4*  HCT 30.9* 27.9* 29.2* 33.5*  PLT  PLATELET CLUMPS NOTED ON SMEAR, UNABLE TO ESTIMATE 113* 112* 84*    COAGS:  Recent Labs  2017-05-05 1434 05/03/17 0209 05/04/17 0811 05/06/17 0608  INR 1.38 1.56 1.68 1.55  APTT  --  35  --   --     BMP:  Recent Labs  05/03/17 0209 05/04/17 0811 05/05/17 0250 05/06/17 0608  NA 123* 124* 121* 126*  K 4.1 4.7 4.7 4.9  CL 93* 94* 92* 95*  CO2 20* 17* 23 18*  GLUCOSE 103* 87 215* 127*  BUN 13 17 25* 35*  CALCIUM 8.2* 8.4* 8.6* 9.5  CREATININE 0.88 0.91 1.01* 0.95  GFRNONAA >60 >60 >60 >60  GFRAA >60 >60 >60 >60    LIVER FUNCTION TESTS:  Recent Labs  05/03/17 0209 05/04/17 0811 05/05/17 0250 05/06/17 0608  BILITOT 7.0* 9.6* 8.0* 7.1*  AST 78* 81* 62* 69*  ALT 83* 78* 67* 75*  ALKPHOS 88 89 102 114  PROT 6.6 6.7 6.3* 7.1  ALBUMIN 2.0* 2.0* 1.8* 2.0*    Assessment and Plan:  Right pneumothorax Chest tube placed 9/30 am Will follow + airleak  Electronically Signed: Avenly Roberge A, PA-C 05/08/2017, 10:38 AM   I spent a total of 15 Minutes at the the patient's bedside AND on the patient's [facility] floor or unit, greater than 50% of which was counseling/coordinating care for right chest tube     Patient ID: [facility], female   DOB: Jun 29, 1966, 51 y.o.   MRN: [id number]

## 2017-05-08 NOTE — Evaluation (Signed)
Occupational Therapy Evaluation Patient Details Name: Rhonda Haley MRN: [id number] DOB: 12/13/1965 Today's Date: 05/08/2017    History of Present Illness 51 y.o. female who presents with sepsis from HCAP in the setting of severe ETOH cirrhosis and recurrent pleural effusion   Clinical Impression   PTA, pt was independent with ADL and functional mobility and was working. She currently presents with confusion, decreased strength, difficulty sequencing, and decreased activity tolerance for ADL impacting her ability to participate in ADL at [facility]. She currently requires mod assist for LB ADL, min assist for UB ADL, and mod assist for stand-pivot toilet transfers. Pt with significant motor planning deficits noted with RW use and with attempts to don socks. She would benefit from continued OT services while admitted to improve independence and safety with ADL participation. At current functional level, feel pt would benefit from short-term SNF level rehabilitation to maximize independence prior to returning home. Will continue to update D/C recommendations as necessary based on pt progress.     Follow Up Recommendations  Supervision/Assistance - 24 hour;SNF (Depending on progress)    Equipment Recommendations  3 in 1 bedside commode    Recommendations for Other Services       Precautions / Restrictions Precautions Precautions: Fall Precaution Comments: watch sats Restrictions Weight Bearing Restrictions: No      Mobility Bed Mobility Overal bed mobility: Needs Assistance Bed Mobility: Supine to Sit     Supine to sit: HOB elevated;Mod assist     General bed mobility comments: Assist for B LE. VC's for motor planning.   Transfers Overall transfer level: Needs assistance Equipment used: Rolling walker (2 wheeled) Transfers: Sit to/from [employer] Sit to Stand: Mod assist Stand pivot transfers: Mod assist       General transfer comment: Mod assist to power up and  steady. Attempted without RW but pt unable to complete and reports significant fear of falling. Much improved with RW.     Balance Overall balance assessment: Needs assistance Sitting-balance support: No upper extremity supported;Feet supported Sitting balance-Leahy Scale: Fair     Standing balance support: No upper extremity supported;During functional activity Standing balance-Leahy Scale: Fair                             ADL either performed or assessed with clinical judgement   ADL Overall ADL's : Needs assistance/impaired Eating/Feeding: Set up;Sitting   Grooming: Set up;Sitting   Upper Body Bathing: Minimal assistance;Sitting   Lower Body Bathing: Moderate assistance;Sit to/from stand   Upper Body Dressing : Minimal assistance;Sitting   Lower Body Dressing: Moderate assistance;Sit to/from stand   Toilet Transfer: Moderate assistance;Stand-pivot;RW;BSC;Cueing for sequencing   Toileting- Clothing Manipulation and Hygiene: Maximal assistance;Sit to/from stand       Functional mobility during ADLs: Moderate assistance (stand-pivot only) General ADL Comments: Requiring step-by-step cueing.      Vision   Vision Assessment?: No apparent visual deficits     Perception     Praxis      Pertinent Vitals/Pain Pain Assessment: No/denies pain     Hand Dominance     Extremity/Trunk Assessment Upper Extremity Assessment Upper Extremity Assessment: Generalized weakness   Lower Extremity Assessment Lower Extremity Assessment: Generalized weakness       Communication Communication Communication: No difficulties   Cognition Arousal/Alertness: Lethargic Behavior During Therapy: WFL for tasks assessed/performed Overall Cognitive Status: Impaired/Different from baseline Area of Impairment: Orientation;Attention;Memory;Following commands;Safety/judgement;Awareness;Problem solving  Orientation Level: Disoriented  to;Place;Time;Situation Current Attention Level: Sustained Memory: Decreased short-term memory Following Commands: Follows one step commands consistently;Follows multi-step commands inconsistently Safety/Judgement: Decreased awareness of safety;Decreased awareness of deficits Awareness: Intellectual Problem Solving: Slow processing;Difficulty sequencing General Comments: Pt with difficulty motor planning and problem solving how to put her sock on. ZShe reports "I can't figure out how to do this."   General Comments  Pt's SpO2 probe frequently falling off and replaced.     Exercises     Shoulder Instructions      Home Living Family/patient expects to be discharged to:: Private residence Living Arrangements: Spouse/significant other Available Help at Discharge: Family;Available 24 hours/day Type of Home: House Home Access: Stairs to enter [employer] of Steps: 3   Home Layout: One level         Bathroom Toilet: Standard     Home Equipment: Environmental consultant - 2 wheels;Wheelchair - [employer];Toilet riser          Prior Functioning/Environment Level of Independence: Independent        Comments: was working        OT Problem List: Decreased strength;Decreased activity tolerance;Impaired balance (sitting and/or standing);Decreased safety awareness;Decreased knowledge of use of DME or AE;Decreased knowledge of precautions;Decreased cognition      OT Treatment/Interventions: Self-care/ADL training;Therapeutic exercise;Energy conservation;DME and/or AE instruction;Therapeutic activities;Patient/family education;Balance training    OT Goals(Current goals can be found in the care plan section) Acute Rehab OT Goals Patient Stated Goal: return to work OT Goal Formulation: With patient Time For Goal Achievement: 05/22/17 Potential to Achieve Goals: Good ADL Goals Pt Will Perform Grooming: with min assist;standing Pt Will Perform Lower Body Dressing: with min  guard assist;sit to/from stand Pt Will Transfer to Toilet: with min guard assist;ambulating;bedside commode (BSC over toilet) Pt Will Perform Toileting - Clothing Manipulation and hygiene: with min guard assist;sit to/from stand Pt/caregiver will Perform Home Exercise Program: Increased ROM;Increased strength;Both right and left upper extremity;With Supervision;With written HEP provided Additional ADL Goal #1: Pt will demonstrate selective attention to seated grooming tasks at EOB.  Additional ADL Goal #2: Pt will follow 3/4 multistep commands during ADL participation.   OT Frequency: Min 2X/week   Barriers to D/C:            Co-evaluation              AM-PAC PT "6 Clicks" Daily Activity     Outcome Measure Help from another person eating meals?: A Little Help from another person taking care of personal grooming?: A Little Help from another person toileting, which includes using toliet, bedpan, or urinal?: A Lot Help from another person bathing (including washing, rinsing, drying)?: A Lot Help from another person to put on and taking off regular upper body clothing?: A Little Help from another person to put on and taking off regular lower body clothing?: A Lot 6 Click Score: 15   End of Session Equipment Utilized During Treatment: Rolling walker (unable to safely place gait belt due to chest tube placement) Nurse Communication: Mobility status  Activity Tolerance: Patient tolerated treatment well Patient left: in chair;with call bell/phone within reach;with chair alarm set  OT Visit Diagnosis: Other abnormalities of gait and mobility (R26.89);Muscle weakness (generalized) (M62.81);Other symptoms and signs involving cognitive function                Time: 9147-8295 OT Time Calculation (min): 41 min Charges:  OT General Charges $OT Visit: 1 Visit OT Evaluation $OT Eval Moderate Complexity: 1 Mod  OT Treatments $Self Care/Home Management : 23-37 mins G-Codes:     Doristine Section, MS OTR/L  Pager: [phone]   Deadrick Stidd A Merari Pion 05/08/2017, 9:03 AM

## 2017-05-08 NOTE — Progress Notes (Signed)
Physical Therapy Treatment Patient Details Name: Rhonda Haley MRN: [id number] DOB: 1966/03/06 Today's Date: 05/08/2017    History of Present Illness 51 y.o. female who presents with sepsis from HCAP in the setting of severe ETOH cirrhosis and recurrent pleural effusion    PT Comments    Pt admitted with above diagnosis. Pt currently with functional limitations due to the deficits listed below (see PT Problem List). Pt was able to stand and pivot to chair with mod assist.  Very shaky with steps.  Pt not  progressing as originally anticipated.  Feel that SNF may be warranted.  Will continue to progress pt as able. Pt will benefit from skilled PT to increase their independence and safety with mobility to allow discharge to the venue listed below.     Follow Up Recommendations  Supervision/Assistance - 24 hour;SNF     Equipment Recommendations  None recommended by PT    Recommendations for Other Services OT consult     Precautions / Restrictions Precautions Precautions: Fall Precaution Comments: watch sats Restrictions Weight Bearing Restrictions: No    Mobility  Bed Mobility Overal bed mobility: Needs Assistance Bed Mobility: Supine to Sit     Supine to sit: Min assist     General bed mobility comments: Assist for B LE. VC's for motor planning.   Transfers Overall transfer level: Needs assistance Equipment used: Rolling walker (2 wheeled) Transfers: Sit to/from [employer] Sit to Stand: Mod assist Stand pivot transfers: Mod assist       General transfer comment: Mod assist to power up and steady. Needed RW for support to take pivotal steps to chair with bil LE weakness and instability noted.   Ambulation/Gait                 Stairs            Wheelchair Mobility    Modified Rankin (Stroke Patients Only)       Balance Overall balance assessment: Needs assistance Sitting-balance support: No upper extremity supported;Feet  supported Sitting balance-Leahy Scale: Fair     Standing balance support: During functional activity;Bilateral upper extremity supported Standing balance-Leahy Scale: Poor Standing balance comment: relies on UE support for balance                            Cognition Arousal/Alertness: Awake/alert Behavior During Therapy: WFL for tasks assessed/performed Overall Cognitive Status: Impaired/Different from baseline Area of Impairment: Orientation;Attention;Memory;Following commands;Safety/judgement;Awareness;Problem solving                 Orientation Level: Disoriented to;Time Current Attention Level: Sustained Memory: Decreased short-term memory Following Commands: Follows one step commands consistently;Follows multi-step commands inconsistently Safety/Judgement: Decreased awareness of safety;Decreased awareness of deficits Awareness: Intellectual Problem Solving: Slow processing;Difficulty sequencing General Comments: Delayed following of commands.       Exercises General Exercises - Lower Extremity Ankle Circles/Pumps: AROM;Both;10 reps;Seated Long Arc Quad: AROM;Both;10 reps;Seated Hip Flexion/Marching: AROM;Both;10 reps;Seated    General Comments General comments (skin integrity, edema, etc.): VSS on 2LO2 with sats 100%.        Pertinent Vitals/Pain Pain Assessment: No/denies pain    Home Living                      Prior Function            PT Goals (current goals can now be found in the care plan section) Progress towards PT goals: Progressing toward  goals    Frequency    Min 3X/week      PT Plan Discharge plan needs to be updated    Co-evaluation              AM-PAC PT "6 Clicks" Daily Activity  Outcome Measure  Difficulty turning over in bed (including adjusting bedclothes, sheets and blankets)?: A Lot Difficulty moving from lying on back to sitting on the side of the bed? : A Lot Difficulty sitting down on and  standing up from a chair with arms (e.g., wheelchair, bedside commode, etc,.)?: A Lot Help needed moving to and from a bed to chair (including a wheelchair)?: A Lot Help needed walking in hospital room?: Total Help needed climbing 3-5 steps with a railing? : Total 6 Click Score: 10    End of Session Equipment Utilized During Treatment: Oxygen;Gait belt Activity Tolerance: Patient limited by fatigue Patient left: in chair;with call bell/phone within reach Nurse Communication: Mobility status;Precautions PT Visit Diagnosis: Other abnormalities of gait and mobility (R26.89);Difficulty in walking, not elsewhere classified (R26.2);Muscle weakness (generalized) (M62.81)     Time: 1308-6578 PT Time Calculation (min) (ACUTE ONLY): 20 min  Charges:  $Therapeutic Activity: 8-22 mins                    G Codes:       Aveleen Nevers,PT Acute Rehabilitation [phone] (pager)    Berline Lopes 05/08/2017, 12:29 PM

## 2017-05-08 NOTE — Progress Notes (Signed)
Daily Rounding Note  05/08/2017, 10:43 AM  LOS: 5 days   SUBJECTIVE:   Chief complaint: lethargic.  Unable to take po, including lactulose any other po meds, yesterday due to lethargy.  No BMs yesterday.  This AM lethargy improved but not resolved.  Took Lactulose today.  Reviewed meds and she has not been getting Rifaximin here or at home.        OBJECTIVE:         Vital signs in last 24 hours:    Temp:  [97.5 F (36.4 C)-97.8 F (36.6 C)] 97.7 F (36.5 C) (10/01 0757) Pulse Rate:  [75-91] 75 (10/01 0314) Resp:  [21-29] 29 (10/01 0314) BP: (85-123)/(46-65) 123/46 (10/01 0314) SpO2:  [100 %] 100 % (10/01 0314) Weight:  [48.5 kg (106 lb 14.8 oz)] 48.5 kg (106 lb 14.8 oz) (10/01 0500) Last BM Date: 05/07/17 Filed Weights   05/05/17 0430 05/07/17 0400 05/08/17 0500  Weight: 52.5 kg (115 lb 11.9 oz) 48.2 kg (106 lb 4.2 oz) 48.5 kg (106 lb 14.8 oz)   General: lethargic, but was able to wake her up.  Looks worse today than 3 days ago.   Heart: RRR Chest: diminished BS on right, chest tube in place.   Abdomen: soft, NT, ND  Extremities: UE anasarca, no LE edema Neuro/Psych:  Cooperative, pleasant, appropriate.  + UE and head tremors, no overt asterixis.   Derm:  Multiple telangectasia on trunk.    Intake/Output from previous day: 09/30 0701 - 10/01 0700 In: 454.6 [I.V.:4.6; IV Piggyback:450] Out: 1110 [Chest Tube:560]  Intake/Output this shift: Total I/O In: 3 [I.V.:3] Out: -   Lab Results:  Recent Labs  05/06/17 0608 05/07/17 0924  WBC 20.7* 14.1*  HGB 10.2* 11.4*  HCT 29.2* 33.5*  PLT 112* 84*   BMET  Recent Labs  05/06/17 0608  NA 126*  K 4.9  CL 95*  CO2 18*  GLUCOSE 127*  BUN 35*  CREATININE 0.95  CALCIUM 9.5   LFT  Recent Labs  05/06/17 0608  PROT 7.1  ALBUMIN 2.0*  AST 69*  ALT 75*  ALKPHOS 114  BILITOT 7.1*   PT/INR  Recent Labs  05/06/17 0608  LABPROT 18.4*  INR 1.55     Studies/Results: Dg Chest 1 View  Result Date: 05/06/2017 CLINICAL DATA:  Post right-sided thoracentesis. EXAM: CHEST 1 VIEW COMPARISON:  05/06/2017; 05/05/2017 FINDINGS: Interval development of a small presumably ex vacuo pneumothorax post right-sided thoracentesis. Near complete resolution of right-sided pleural fluid. Grossly unchanged cardiac silhouette and mediastinal contours. The pulmonary vasculature appears indistinct with cephalization of flow. Worsening left basilar/ retrocardiac opacities. No acute osseus abnormalities. IMPRESSION: 1. Interval development of a small presumably ex vacuo pneumothorax post right-sided thoracentesis. 2. Near complete resolution of right-sided pleural effusion post thoracentesis. 3. Similar findings of pulmonary edema with worsening left basilar opacities, atelectasis versus infiltrate Patient was evaluated by the dictating interventional radiologist prior to transfer back to the floor and patient was NOT in respiratory distress and rather reported improvement in her preprocedural shortness of breath. Above findings were discussed with ordering physician, Dr. Marland Mcalpine. Electronically Signed   By: Simonne Come M.D.   On: 05/06/2017 14:34   Dg Chest 2 View  Result Date: 05/08/2017 CLINICAL DATA:  51 year old female with history of right-sided hemothorax. Evaluate chest tube physician. EXAM: CHEST  2 VIEW COMPARISON:  Chest x-ray a 05/07/2017. FINDINGS: Interval placement of a small bore right-sided chest tube  with pigtail reformed in the upper right hemithorax. Previously noted large right-sided hydropneumothorax has decreased slightly in size, currently a occupying approximately 50% of the volume of the right hemithorax. Widespread interstitial and airspace disease again noted throughout both lungs (left greater than right). Heart size is normal. Mediastinal contours remain slightly displaced to the left, suggesting potential tension component. IMPRESSION: 1.  Decreased size of what continues to be a large right-sided hydropneumothorax following right-sided chest tube placement. The continues to be some evidence of mild right to left midline shift, suggesting some tension. 2. Persistent asymmetrically distributed multifocal interstitial and airspace disease throughout all aspects of the lungs bilaterally (left greater than right), concerning for multilobar pneumonia. Electronically Signed   By: Trudie Reed M.D.   On: 05/08/2017 07:44   Dg Chest Port 1 View  Result Date: 05/07/2017 CLINICAL DATA:  Right-sided pneumothorax. EXAM: PORTABLE CHEST 1 VIEW COMPARISON:  Chest radiograph - earlier same day; 05/06/2017; 05/04/2017 FINDINGS: Grossly unchanged cardiac silhouette and mediastinal contours with atherosclerotic plaque within the thoracic aorta. There has been continued increase in size of right-sided pneumothorax, now with minimal amount of right to left mediastinal shift. Pulmonary vasculature remains indistinct with cephalization of flow. Unchanged small layering right-sided effusion. No acute osseus abnormalities. IMPRESSION: 1. Continued increase in size of moderate-sized right-sided pneumothorax, now with minimal amount of right to left mediastinal shift. 2. Similar findings suggestive of pulmonary edema and bibasilar opacities, left greater than right, atelectasis versus infiltrate. PLAN: Given suspected continued increase in size of right-sided pneumothorax, the decision was made to proceed with CT-guided chest tube placement. Electronically Signed   By: Simonne Come M.D.   On: 05/07/2017 14:07   Dg Chest Port 1 View  Result Date: 05/07/2017 CLINICAL DATA:  Post right-sided thoracentesis with ex vacuo pneumothorax. EXAM: PORTABLE CHEST 1 VIEW COMPARISON:  05/06/2017; 05/05/2017; 05/04/2017 FINDINGS: Grossly unchanged cardiac silhouette and mediastinal contours with atherosclerotic plaque within the thoracic aorta. Suspected increase in size of now  moderate size right-sided pneumothorax with slight differences potentially accentuated by kyphosis and rotation. Pulmonary vasculature remains indistinct with cephalization of flow. Perihilar opacities are unchanged in favored to represent atelectasis. No new focal airspace opacities. No acute osseus abnormalities. IMPRESSION: 1. Suspected slight increase in now moderate sized right-sided pneumothorax with slight differences potentially accentuated due to patient rotation. Again, this pneumothorax could be an ex vacuo pneumothorax in the setting of recent large volume thoracentesis though clinical correlation is advised. Continued attention on follow-up is recommended. 2. Otherwise, similar findings of pulmonary edema and bibasilar opacities, left greater than right, atelectasis versus infiltrate. Critical Value/emergent results were called by telephone at the time of interpretation on 05/07/2017 at 12:29 pm to Dr. Marguerita Merles , who verbally acknowledged these results. Electronically Signed   By: Simonne Come M.D.   On: 05/07/2017 12:29   Ct Perc Pleural Drain W/indwell Cath W/img Guide  Result Date: 05/07/2017 INDICATION: Enlarging right-sided pneumothorax post thoracentesis. Please perform CT-guided chest tube placement. EXAM: CT PERC PLEURAL DRAIN W/INDWELL CATH W/IMG GUIDE COMPARISON:  Chest radiograph - earlier same day; 05/06/2017; 05/05/2017 MEDICATIONS: The patient is currently admitted to the hospital and receiving intravenous antibiotics. The antibiotics were administered within an appropriate time frame prior to the initiation of the procedure. ANESTHESIA/SEDATION: Moderate (conscious) sedation was employed during this procedure. A total of Versed 0.5 mg and Fentanyl 12.5 mcg was administered intravenously. Moderate Sedation Time: 18 minutes. The patient's level of consciousness and vital signs were monitored continuously by radiology  nursing throughout the procedure under my direct supervision.  CONTRAST:  None COMPLICATIONS: None immediate. PROCEDURE: Informed written consent was obtained from the patient after a discussion of the risks, benefits and alternatives to treatment. The patient was placed supine on the CT gantry and a pre procedural CT was performed re-demonstrating the known right-sided hydropneumothorax. The procedure was planned. A timeout was performed prior to the initiation of the procedure. The skin overlying the ventral aspect the right upper chest was prepped and draped in the usual sterile fashion. The overlying soft tissues were anesthetized with 1% lidocaine with epinephrine. Appropriate trajectory was planned with the use of a 22 gauge spinal needle. An 18 gauge trocar needle was advanced into the right pleural space and a short Amplatz super stiff wire was coiled within the right pleural space. Appropriate positioning was confirmed with a limited CT scan. The tract was serially dilated allowing placement of a 10 French alJamaicaurpose drainage catheter. Appropriate positioning was confirmed with a limited postprocedural CT scan. Approximately 500 cc of serous fluid was aspirated following percutaneous drainage catheter placement. The tube was connected to a pleural vac device and sutured in place. A dressing was placed. The patient tolerated the procedure well without immediate post procedural complication. IMPRESSION: Successful CT guided placement of a 10 French all purpose drain catheter into the right pleural space for enlarging right-sided hydropneumothorax. Electronically Signed   By: Simonne Come M.D.   On: 05/07/2017 15:42   US Thoracentesis Asp Pleural Space W/img Guide  Result Date: 05/06/2017 INDICATION: History of alcoholic cirrhosis, now with recurrent symptomatic right-sided pleural effusions. Please perform ultrasound-guided thoracentesis for therapeutic purposes. EXAM: US THORACENTESIS ASP PLEURAL SPACE W/IMG GUIDE COMPARISON:  Chest radiograph - earlier same day ;  ultrasound-guided thoracentesis - 03/19/2017; 02/15/2017; 01/20/2017 MEDICATIONS: None. COMPLICATIONS: SIR Level A - No therapy, no consequence. Procedure complicated by development of a moderate size ex vacuo pneumothorax. Patient was evaluated from the procedure reported improvement in her preprocedural shortness of breath. TECHNIQUE: Informed written consent was obtained from the patient after a discussion of the risks, benefits and alternatives to treatment. A timeout was performed prior to the initiation of the procedure. Initial ultrasound scanning demonstrates a large anechoic right-sided pleural effusion. The lower chest was prepped and draped in the usual sterile fashion. 1% lidocaine was used for local anesthesia. An ultrasound image was saved for documentation purposes. An 8 Fr Safe-T-Centesis catheter was introduced. The thoracentesis was performed. The catheter was removed and a dressing was applied. The patient tolerated the procedure well without immediate post procedural complication. The patient was escorted to have an upright chest radiograph. FINDINGS: A total of approximately 1.4 liters of serous fluid was removed. IMPRESSION: Successful ultrasound-guided right sided thoracentesis yielding 1.4 liters of pleural fluid. Electronically Signed   By: Simonne Come M.D.   On: 05/06/2017 14:44   Scheduled Meds: . escitalopram  20 mg Oral Daily  . feeding supplement (ENSURE ENLIVE)  237 mL Oral BID BM  . ferrous sulfate  325 mg Oral BID WC  . folic acid  1 mg Intravenous Daily  . furosemide  40 mg Oral Daily  . lactulose  20 g Oral BID  . mouth rinse  15 mL Mouth Rinse BID  . methylPREDNISolone (SOLU-MEDROL) injection  40 mg Intravenous Q12H  . multivitamin with minerals  1 tablet Oral Daily  . pantoprazole  20 mg Oral QAC breakfast  . sodium chloride flush  3 mL Intravenous Q12H  . spironolactone  100 mg Oral Daily  . thiamine injection  100 mg Intravenous Daily   Or  . thiamine  100 mg  Oral Daily   Continuous Infusions: . ceFEPime (MAXIPIME) IV Stopped (05/08/17 1610)  . vancomycin 1,000 mg (05/07/17 2100)   PRN Meds:.LORazepam, ondansetron **OR** ondansetron (ZOFRAN) IV   ASSESMENT:   *  Decompensated cirrhosis and ETOH hepatitis.  Completed 30 plus days of Prednisolone, had been on taper at time of admission.  MELD 19 as of 9/29. LFTs stable.     *  Right pleural effusion.  Hepatic hydrothorax.   1.4 liter thoracentesis on 9/29.  Chest tube placed for post tap pneumothorax.  O2 sats improved from low 90s to 100%.    Managing this with Aldactone 100, Lasix 40 daily.  Had to drope diuretic dose on 9/28 due to bump in renal function.   Home doses were 200/60.  *  Hepatic encephalopathy:  Somnolence, lethargy.    *  Renal insufficiency.  BUN up, creatinine down.  25/1.0 >> 35/0.95  *  Protein calorie malnutrition.  Ensure in place.  Consumption avg 25% of meals.    *  HCAP with hypoxia.  On Vanc, Maxipime.  On Solumedrol.      *  Chronic hyponatremia.  Na improved.    *  Chronic thrombocytopenia.     PLAN   *  Add Rifaximin for encephlopathy.  Maintain current dose of Lactulose but add extra PM dose today.   BMET in AM.  Will d/w Dr Russella Dar whether to increase diuretics to Aldactone 125/Lasix 60?Marland Kitchen     *  Should she stay on Solumedrol? No longer needs steroids for etoh hepatitis.  Also appears it was started for HCAP.      Jennye Moccasin  05/08/2017, 10:43 AM Pager: [phone]     Attending physician's note   I have taken an interval history, reviewed the chart and examined the patient. I agree with the Advanced Practitioner's note, impression and recommendations. Somnonlent and lethargic today, suspect HE worsening, however primary service should evaluate for other causes. Increase lactulose, add rifaximin and trend ammonia. Increase diuretics to Aldactone 125 mg daily and Lasix 60 mg daily and trend BMET.   Claudette Head, MD [facility] [phone] Mon-Fri  8a-5p [phone] after 5p, weekends, holidays

## 2017-05-08 DEATH — deceased

## 2017-05-09 ENCOUNTER — Inpatient Hospital Stay (HOSPITAL_COMMUNITY): Payer: 59

## 2017-05-09 DIAGNOSIS — J90 Pleural effusion, not elsewhere classified: Secondary | ICD-10-CM

## 2017-05-09 DIAGNOSIS — Z9689 Presence of other specified functional implants: Secondary | ICD-10-CM

## 2017-05-09 LAB — CBC WITH DIFFERENTIAL/PLATELET
BASOS ABS: 0 10*3/uL (ref 0.0–0.1)
Basophils Relative: 0 %
EOS ABS: 0 10*3/uL (ref 0.0–0.7)
Eosinophils Relative: 0 %
HCT: 27.8 % — ABNORMAL LOW (ref 36.0–46.0)
Hemoglobin: 9.2 g/dL — ABNORMAL LOW (ref 12.0–15.0)
LYMPHS ABS: 1.5 10*3/uL (ref 0.7–4.0)
LYMPHS PCT: 9 %
MCH: 33.1 pg (ref 26.0–34.0)
MCHC: 33.1 g/dL (ref 30.0–36.0)
MCV: 100 fL (ref 78.0–100.0)
Monocytes Absolute: 1.7 10*3/uL — ABNORMAL HIGH (ref 0.1–1.0)
Monocytes Relative: 10 %
NEUTROS ABS: 13.8 10*3/uL — AB (ref 1.7–7.7)
Neutrophils Relative %: 81 %
PLATELETS: 135 10*3/uL — AB (ref 150–400)
RBC: 2.78 MIL/uL — ABNORMAL LOW (ref 3.87–5.11)
RDW: 14.8 % (ref 11.5–15.5)
WBC: 17 10*3/uL — AB (ref 4.0–10.5)

## 2017-05-09 LAB — COMPREHENSIVE METABOLIC PANEL
ALT: 74 U/L — ABNORMAL HIGH (ref 14–54)
ANION GAP: 8 (ref 5–15)
AST: 58 U/L — ABNORMAL HIGH (ref 15–41)
Albumin: 1.8 g/dL — ABNORMAL LOW (ref 3.5–5.0)
Alkaline Phosphatase: 109 U/L (ref 38–126)
BUN: 53 mg/dL — ABNORMAL HIGH (ref 6–20)
CHLORIDE: 93 mmol/L — AB (ref 101–111)
CO2: 23 mmol/L (ref 22–32)
Calcium: 9.6 mg/dL (ref 8.9–10.3)
Creatinine, Ser: 0.98 mg/dL (ref 0.44–1.00)
GFR calc non Af Amer: >60 mL/min (ref >60)
Glucose, Bld: 114 mg/dL — ABNORMAL HIGH (ref 65–99)
POTASSIUM: 5.4 mmol/L — AB (ref 3.5–5.1)
SODIUM: 124 mmol/L — AB (ref 135–145)
Total Bilirubin: 6 mg/dL — ABNORMAL HIGH (ref 0.3–1.2)
Total Protein: 6.1 g/dL — ABNORMAL LOW (ref 6.5–8.1)

## 2017-05-09 LAB — MAGNESIUM: MAGNESIUM: 2.4 mg/dL (ref 1.7–2.4)

## 2017-05-09 LAB — AMMONIA: Ammonia: 49 umol/L — ABNORMAL HIGH (ref 9–35)

## 2017-05-09 LAB — PHOSPHORUS: Phosphorus: 2.9 mg/dL (ref 2.5–4.6)

## 2017-05-09 LAB — MRSA PCR SCREENING: MRSA by PCR: NEGATIVE

## 2017-05-09 MED ORDER — SODIUM POLYSTYRENE SULFONATE 15 GM/60ML PO SUSP
30.0000 g | Freq: Once | ORAL | Status: AC
  Administered 2017-05-09: 30 g via ORAL
  Filled 2017-05-09: qty 120

## 2017-05-09 MED ORDER — FOLIC ACID 1 MG PO TABS
1.0000 mg | ORAL_TABLET | Freq: Every day | ORAL | Status: DC
  Administered 2017-05-09 – 2017-05-15 (×7): 1 mg via ORAL
  Filled 2017-05-09 (×7): qty 1

## 2017-05-09 MED ORDER — ENSURE ENLIVE PO LIQD
237.0000 mL | Freq: Three times a day (TID) | ORAL | Status: DC
  Administered 2017-05-09 – 2017-05-14 (×12): 237 mL via ORAL

## 2017-05-09 NOTE — Consult Note (Signed)
PULMONARY / CRITICAL CARE MEDICINE   Name: Rhonda Haley MRN: [id number] DOB: 12-06-1965    ADMISSION DATE:  04/17/2017 CONSULTATION DATE:05/09/2017  REFERRING MD:  Russella Dar  CHIEF COMPLAINT:  Fever, Chills  HISTORY OF PRESENT ILLNESS:   51 year old with chronic liver disease who presented with fevers chills and hypotension. She had a significant white count with a left shift and she was started on empiric therapy for care associated pneumonia with a combination of vancomycin and cefepime. She has a known history of prior hepatic pleural effusion. She had a very large right pleural effusion on this admission which was drained with a pigtail catheter and September 30. That fluid has shown no growth and had a LDH of only 34 a pH of 8.1 and only 24 white blood cells. The pigtail catheter is no longer draining and there is a very large residual collection on the chest x-ray as well as a persistent pneumothorax. Of greater concern there is an infiltrate throughout the left lung which does not represent chronic interstitial disease as an x-ray done on July 17 did not show that infiltrate. Spite the appearance of the chest x-ray the patient is sitting up in bed breathing comfortably with a saturation of 100% on room air.  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  PAST SURGICAL HISTORY: She  has a past surgical history that includes Fertility Surgery (2003); Wisdom tooth extraction; IR THORACENTESIS ASP PLEURAL SPACE W/IMG GUIDE (02/21/2017); Esophagogastroduodenoscopy (01/2017); Colonoscopy (01/2017); Refractive surgery (Bilateral, ~ 2012); and Eye surgery.  No Known Allergies  No current facility-administered medications on file  prior to encounter.    Current Outpatient Prescriptions on File Prior to Encounter  Medication Sig  . escitalopram (LEXAPRO) 20 MG tablet Take 20 mg by mouth daily.  . feeding supplement, ENSURE ENLIVE, (ENSURE ENLIVE) LIQD Take 237 mLs by mouth 2 (two) times daily between meals.  . ferrous sulfate 325 (65 FE) MG tablet Take 1 tablet (325 mg total) by mouth 2 (two) times daily with a meal.  . furosemide (LASIX) 40 MG tablet Take 1.5 tablets (60 mg total) by mouth daily.  . Melatonin 10 MG TABS Take 10 mg by mouth at bedtime.  . Multiple Vitamin (MULTIVITAMIN WITH MINERALS) TABS tablet Take 1 tablet by mouth daily.  . pantoprazole (PROTONIX) 20 MG tablet Take 1 tablet (20 mg total) by mouth daily before breakfast.  . potassium chloride SA (K-DUR,KLOR-CON) 20 MEQ tablet Take 1 tablet (20 mEq total) by mouth daily.  . prednisoLONE (MILLIPRED) 5 MG TABS tablet PER LAST NOTE FROM 04/25/17 YOU SHOULD BE DOING ALREADY,Taper your prednisone as follows:  starting tomorrow for 4 Days then go to  for 4 days, then  for 4 days, then  for 4 days then STOP  . spironolactone (ALDACTONE) 100 MG tablet Take 2 tablets (200 mg total) by mouth daily. (Patient taking differently: Take 250 mg by mouth daily. )  . thiamine (VITAMIN B-1) 100 MG tablet Take 1 tablet (100 mg total) by mouth daily.    FAMILY HISTORY:  Her indicated that the status of her mother is unknown.    SOCIAL HISTORY: She  reports that she has been smoking Cigarettes.  She has a 8.25 pack-year smoking history. She has never used smokeless tobacco. She reports  that she drinks alcohol. She reports that she does not use drugs.  REVIEW OF SYSTEMS:   Other than as noted is noncontributory  VITAL SIGNS: BP (!) 91/49 (BP Location: Left Arm)   Pulse (!) 102   Temp (!) 97.5 F (36.4 C) (Axillary)   Resp (!) 28   Ht  (1.727 m)   Wt 108 lb 7.5 oz (49.2 kg)   SpO2 100%   BMI 16.49 kg/m       INTAKE / OUTPUT: I/O last 3  completed shifts: In: 203 [I.V.:3; IV Piggyback:200] Out: 12 [Chest Tube:12]  PHYSICAL EXAMINATION: General: This is a thin not quite cachectic female in no acute distress. She is entirely appropriately conversant. Cardiovascular: The PMI is impressively hyperdynamic. S1 and S2 are regular with a 3/6 systolic ejection murmur. Lungs:  There is decreased air movement throughout the right hemithorax. No wheezes. There is no air leak from the pigtail catheter, however there is also no tidaling of the fluid in the tube with either cough or deep inspiration. Abdomen:  Slightly distended and tympanic. I don't appreciate any fluid wave. I cannot appreciate the liver edge or spleen. She is slightly icteric and has multiple spiders.   LABS:  BMET  Recent Labs Lab 05/06/17 0608 05/08/17 1023 05/09/17 0218  NA 126* 129* 124*  K 4.9 5.8* 5.4*  CL 95* 100* 93*  CO2 18* 18* 23  BUN 35* 57* 53*  CREATININE 0.95 1.15* 0.98  GLUCOSE 127* 143* 114*    Electrolytes  Recent Labs Lab 05/06/17 0608 05/08/17 1023 05/09/17 0218  CALCIUM 9.5 9.8 9.6  MG 2.2 2.6* 2.4  PHOS 3.8 6.0* 2.9    CBC  Recent Labs Lab 05/07/17 0924 05/08/17 1023 05/09/17 0218  WBC 14.1* 13.3* 17.0*  HGB 11.4* 11.7* 9.2*  HCT 33.5* 34.3* 27.8*  PLT 84* PLATELETS APPEAR DECREASED 135*    Coag's  Recent Labs Lab 05/03/17 0209 05/04/17 0811 05/06/17 0608  APTT 35  --   --   INR 1.56 1.68 1.55    Sepsis Markers  Recent Labs Lab 2017-05-30 1817 05-30-2017 2056 05/04/17 1151 05/06/17 0608  LATICACIDVEN 1.5 2.4*  --   --   PROCALCITON  --   --  0.88 0.75    ABG No results for input(s): PHART, PCO2ART, PO2ART in the last 168 hours.  Liver Enzymes  Recent Labs Lab 05/06/17 0608 05/08/17 1023 05/09/17 0218  AST 69* 63* 58*  ALT 75* 74* 74*  ALKPHOS 114 106 109  BILITOT 7.1* 8.0* 6.0*  ALBUMIN 2.0* 2.0* 1.8*    Cardiac Enzymes No results for input(s): TROPONINI, PROBNP in the last 168  hours.  Glucose No results for input(s): GLUCAP in the last 168 hours.  Imaging Dg Chest Port 1 View  Result Date: 05/09/2017 CLINICAL DATA:  Pleural effusion with chest tube in place EXAM: PORTABLE CHEST 1 VIEW COMPARISON:  May 08, 2017 FINDINGS: Chest tube remains on the right. There is a pneumothorax on the right which appears smaller compared to 1 day prior. There may be a slight tension component given slight shift of heart and mediastinum toward the left. This finding is stable. There is a fairly sizable partially loculated pleural effusion on the right. There is underlying interstitial and patchy alveolar edema bilaterally. No new opacity is evident. Heart is upper normal in size with a degree of pulmonary venous hypertension evident. No evident adenopathy. No bone lesions. IMPRESSION: Persistent chest tube on the right without change  in position. The pneumothorax on the right appears smaller compared to 1 day prior. There may be mild tension component, stable. There remains sizable partially loculated pleural effusion on the right. There is a degree of underlying edema, likely due to underlying congestive heart failure. Patchy infiltrate on the left potentially could represent superimposed pneumonia. These changes are stable. The cardiac silhouette is stable. Electronically Signed   By: Bretta Bang III M.D.   On: 05/09/2017 07:46       CULTURES: No positive cultures  ANTIBIOTICS: Vanco and Cefepime  SIGNIFICANT EVENTS: Pigtail placement 9/30   DISCUSSION: This is a 51 year old with a very large chronic right sided pleural effusion which on this admission proves to be a transudate. I suspect that this is a hepatic effusion in that mechanical efforts to control it more likely to be fraught with complications than success. I suggest she continue diuretics as you are doing. I will be obtaining a CT scan of the chest, both to rule out loculations which I very much doubt with a  transudative effusion, and to define the infiltrate in the left lung. I suspect the latter is a process that was empirically treated with a combination of vancomycin and Maxipime but will require follow-up. Finally the existing pigtail is no longer draining. We will place her on 100% oxygen overnight and attempt to decrease the size of the pneumothorax, and anticipate pulling the drain in the morning.  Penny Pia, MD [facility] Pager: [phone]  05/09/2017, 4:14 PM

## 2017-05-09 NOTE — Progress Notes (Signed)
Nutrition Follow-up  DOCUMENTATION CODES:   Severe malnutrition in context of chronic illness, Underweight  INTERVENTION:   -Increase Ensure Enlive po TID, each supplement provides 350 kcal and 20 grams of protein -Continue MVI daily  NUTRITION DIAGNOSIS:   Malnutrition (Severe) related to chronic illness (ETOH cirrhosis) as evidenced by energy intake < 75% for > or equal to 1 month, severe depletion of body fat, severe depletion of muscle mass, moderate depletion of body fat, moderate depletions of muscle mass.  Ongoing  GOAL:   Patient will meet greater than or equal to 90% of their needs  Progressing  MONITOR:   PO intake, Supplement acceptance, Labs, Weight trends, Skin, I & O's  REASON FOR ASSESSMENT:   Malnutrition Screening Tool    ASSESSMENT:   Rhonda Haley is a 51 y.o. female who presents with sepsis from [facility] in the setting of severe ETOH cirrhosis and recurrent pleural effusion. IR consulted at time of admission for thoracentesis.   9/26- s/p thoracentesis for pleural effusion- 1200 ml output noted 9/29- s/p rt thoracentesis (1.4 L removed) 9/30- rt sided hydropneumothorax, chest tube placed  Case discussed with RN, who reports that pt has been tolerating meals well. Noted meal completion 25-75%. RN had just provided pt Ensure.  Spoke with pt, who reports continued good appetite- states she ordered tomato soup and fruit for lunch; consumed toast and fruit for breakfast. Pt also had consumed about 75% of Ensure supplements. She reports she enjoys the supplements and tires to consume at least half of the supplement when offered. RD reinforced importance of good meal and supplement intake to promote healing.   Of note, pt has experienced an 8.5% wt loss over the past past week, which is significant for time frame. Wt changes difficult to assess related for fluid changes, chest tube, and thoracentesis x2.   Medications reviewed and include folvite, MVI, and  thiamine.   Labs reviewed: Na: 124, K: 5.4.  Diet Order:  Diet 2 gram sodium Room service appropriate? Yes; Fluid consistency: Thin  Skin:  Reviewed, no issues  Last BM:  05/09/17  Height:   Ht Readings from Last 1 Encounters:  05/03/17  (1.727 m)    Weight:   Wt Readings from Last 1 Encounters:  05/09/17 108 lb 7.5 oz (49.2 kg)    Ideal Body Weight:  63.6 kg  BMI:  Body mass index is 16.49 kg/m.  Estimated Nutritional Needs:   Kcal:  1600-1800  Protein:  90-105 grams  Fluid:  1.6-1.8 L  EDUCATION NEEDS:   Education needs addressed  [place] A. Mayford Knife, RD, LDN, CDE Pager: [phone] After hours Pager: [phone]

## 2017-05-09 NOTE — Progress Notes (Signed)
Patient oriented to self this morning and when asked where she is she reported "[place]." When asked what city [place] is in she named a city I did not recognize. When asked what state that is in patient reported "[place]."  Patient also reported that the year is 2005 but knew it was October 2nd.

## 2017-05-09 NOTE — Care Management Note (Signed)
Case Management Note  Patient Details  Name: Rhonda Haley MRN: [id number] Date of Birth: 03/03/1966  Subjective/Objective:   Pt admitted with Pnemothorax -Right pneumothorax s/p thoracentesis 9/29, now s/p chest tube placement 9/30                Action/Plan:  PTA from home with husband  but SNF recommended at discharge - CSW following.    Expected Discharge Date:                  Expected Discharge Plan:  Skilled Nursing Facility  In-House Referral:  Clinical Social Work  Discharge planning Services  CM Consult  Post Acute Care Choice:    Choice offered to:     DME Arranged:    DME Agency:     HH Arranged:    HH Agency:     Status of Service:     If discussed at [employer], dates discussed:    Additional Comments:  Cherylann Parr, RN 05/09/2017, 2:33 PM

## 2017-05-09 NOTE — Progress Notes (Signed)
Referring Physician(s):  Dr. Marland Mcalpine  Supervising Physician: Ruel Favors  Patient Status:  [facility] - In-pt  Chief Complaint:  Pneumothorax  Subjective: Patient resting comfortably.  Denies complaints related to chest tube.   Allergies: Patient has no known allergies.  Medications: Prior to Admission medications   Medication Sig Start Date End Date Taking? Authorizing Provider  escitalopram (LEXAPRO) 20 MG tablet Take 20 mg by mouth daily. 01/18/17  Yes [provider]  feeding supplement, ENSURE ENLIVE, (ENSURE ENLIVE) LIQD Take 237 mLs by mouth 2 (two) times daily between meals. 01/21/17  Yes Ghimire, Werner Lean, MD  ferrous sulfate 325 (65 FE) MG tablet Take 1 tablet (325 mg total) by mouth 2 (two) times daily with a meal. 03/17/17  Yes Zehr, Shanda Bumps D, PA-C  furosemide (LASIX) 40 MG tablet Take 1.5 tablets (60 mg total) by mouth daily. 04/05/17  Yes Iva Boop, MD  Melatonin 10 MG TABS Take 10 mg by mouth at bedtime.   Yes [provider]  Multiple Vitamin (MULTIVITAMIN WITH MINERALS) TABS tablet Take 1 tablet by mouth daily. 03/21/17  Yes Calvert Cantor, MD  pantoprazole (PROTONIX) 20 MG tablet Take 1 tablet (20 mg total) by mouth daily before breakfast. 02/01/17  Yes Iva Boop, MD  potassium chloride SA (K-DUR,KLOR-CON) 20 MEQ tablet Take 1 tablet (20 mEq total) by mouth daily. 02/24/17 28-May-2017 Yes Beola Cord, MD  prednisoLONE (MILLIPRED) 5 MG TABS tablet PER LAST NOTE FROM 04/25/17 YOU SHOULD BE DOING ALREADY,Taper your prednisone as follows:  starting tomorrow for 4 Days then go to  for 4 days, then  for 4 days, then  for 4 days then STOP 04/24/17  Yes Iva Boop, MD  spironolactone (ALDACTONE) 100 MG tablet Take 2 tablets (200 mg total) by mouth daily. Patient taking differently: Take 250 mg by mouth daily.  04/20/17  Yes Iva Boop, MD  thiamine (VITAMIN B-1) 100 MG tablet Take 1 tablet (100 mg total) by mouth daily. 04/20/17   Yes Iva Boop, MD     Vital Signs: BP (!) 88/47 (BP Location: Left Arm)   Pulse 95   Temp 97.6 F (36.4 C) (Oral)   Resp (!) 21   Ht  (1.727 m)   Wt 108 lb 7.5 oz (49.2 kg)   SpO2 99%   BMI 16.49 kg/m   Physical Exam  NAD, alert Chest: Chest tube in place, small amount of serous output- 10-20 mL overnight. To water seal.   Imaging: Dg Chest 1 View  Result Date: 05/06/2017 CLINICAL DATA:  Post right-sided thoracentesis. EXAM: CHEST 1 VIEW COMPARISON:  05/06/2017; 05/05/2017 FINDINGS: Interval development of a small presumably ex vacuo pneumothorax post right-sided thoracentesis. Near complete resolution of right-sided pleural fluid. Grossly unchanged cardiac silhouette and mediastinal contours. The pulmonary vasculature appears indistinct with cephalization of flow. Worsening left basilar/ retrocardiac opacities. No acute osseus abnormalities. IMPRESSION: 1. Interval development of a small presumably ex vacuo pneumothorax post right-sided thoracentesis. 2. Near complete resolution of right-sided pleural effusion post thoracentesis. 3. Similar findings of pulmonary edema with worsening left basilar opacities, atelectasis versus infiltrate Patient was evaluated by the dictating interventional radiologist prior to transfer back to the floor and patient was NOT in respiratory distress and rather reported improvement in her preprocedural shortness of breath. Above findings were discussed with ordering physician, Dr. Marland Mcalpine. Electronically Signed   By: Simonne Come M.D.   On: 05/06/2017 14:34   Dg Chest 1 View  Result  Date: 05/06/2017 CLINICAL DATA:  Shortness of breath. EXAM: CHEST 1 VIEW COMPARISON:  05/05/2017 FINDINGS: The cardiomediastinal silhouette is unchanged. A moderate to large right pleural effusion appears slightly smaller. Left greater than right lung airspace opacities have not significantly changed. No sizable left pleural effusion or pneumothorax is identified.  IMPRESSION: 1. Moderate to large right pleural effusion, slightly decreased in size. 2. Unchanged bilateral airspace disease. Electronically Signed   By: Sebastian Ache M.D.   On: 05/06/2017 08:14   Dg Chest 2 View  Result Date: 05/08/2017 CLINICAL DATA:  51 year old female with history of right-sided hemothorax. Evaluate chest tube physician. EXAM: CHEST  2 VIEW COMPARISON:  Chest x-ray a 05/07/2017. FINDINGS: Interval placement of a small bore right-sided chest tube with pigtail reformed in the upper right hemithorax. Previously noted large right-sided hydropneumothorax has decreased slightly in size, currently a occupying approximately 50% of the volume of the right hemithorax. Widespread interstitial and airspace disease again noted throughout both lungs (left greater than right). Heart size is normal. Mediastinal contours remain slightly displaced to the left, suggesting potential tension component. IMPRESSION: 1. Decreased size of what continues to be a large right-sided hydropneumothorax following right-sided chest tube placement. The continues to be some evidence of mild right to left midline shift, suggesting some tension. 2. Persistent asymmetrically distributed multifocal interstitial and airspace disease throughout all aspects of the lungs bilaterally (left greater than right), concerning for multilobar pneumonia. Electronically Signed   By: Trudie Reed M.D.   On: 05/08/2017 07:44   Dg Chest Port 1 View  Result Date: 05/09/2017 CLINICAL DATA:  Pleural effusion with chest tube in place EXAM: PORTABLE CHEST 1 VIEW COMPARISON:  May 08, 2017 FINDINGS: Chest tube remains on the right. There is a pneumothorax on the right which appears smaller compared to 1 day prior. There may be a slight tension component given slight shift of heart and mediastinum toward the left. This finding is stable. There is a fairly sizable partially loculated pleural effusion on the right. There is underlying  interstitial and patchy alveolar edema bilaterally. No new opacity is evident. Heart is upper normal in size with a degree of pulmonary venous hypertension evident. No evident adenopathy. No bone lesions. IMPRESSION: Persistent chest tube on the right without change in position. The pneumothorax on the right appears smaller compared to 1 day prior. There may be mild tension component, stable. There remains sizable partially loculated pleural effusion on the right. There is a degree of underlying edema, likely due to underlying congestive heart failure. Patchy infiltrate on the left potentially could represent superimposed pneumonia. These changes are stable. The cardiac silhouette is stable. Electronically Signed   By: Bretta Bang III M.D.   On: 05/09/2017 07:46   Dg Chest Port 1 View  Result Date: 05/07/2017 CLINICAL DATA:  Right-sided pneumothorax. EXAM: PORTABLE CHEST 1 VIEW COMPARISON:  Chest radiograph - earlier same day; 05/06/2017; 05/04/2017 FINDINGS: Grossly unchanged cardiac silhouette and mediastinal contours with atherosclerotic plaque within the thoracic aorta. There has been continued increase in size of right-sided pneumothorax, now with minimal amount of right to left mediastinal shift. Pulmonary vasculature remains indistinct with cephalization of flow. Unchanged small layering right-sided effusion. No acute osseus abnormalities. IMPRESSION: 1. Continued increase in size of moderate-sized right-sided pneumothorax, now with minimal amount of right to left mediastinal shift. 2. Similar findings suggestive of pulmonary edema and bibasilar opacities, left greater than right, atelectasis versus infiltrate. PLAN: Given suspected continued increase in size of right-sided pneumothorax, the  decision was made to proceed with CT-guided chest tube placement. Electronically Signed   By: Simonne Come M.D.   On: 05/07/2017 14:07   Dg Chest Port 1 View  Result Date: 05/07/2017 CLINICAL DATA:  Post  right-sided thoracentesis with ex vacuo pneumothorax. EXAM: PORTABLE CHEST 1 VIEW COMPARISON:  05/06/2017; 05/05/2017; 05/04/2017 FINDINGS: Grossly unchanged cardiac silhouette and mediastinal contours with atherosclerotic plaque within the thoracic aorta. Suspected increase in size of now moderate size right-sided pneumothorax with slight differences potentially accentuated by kyphosis and rotation. Pulmonary vasculature remains indistinct with cephalization of flow. Perihilar opacities are unchanged in favored to represent atelectasis. No new focal airspace opacities. No acute osseus abnormalities. IMPRESSION: 1. Suspected slight increase in now moderate sized right-sided pneumothorax with slight differences potentially accentuated due to patient rotation. Again, this pneumothorax could be an ex vacuo pneumothorax in the setting of recent large volume thoracentesis though clinical correlation is advised. Continued attention on follow-up is recommended. 2. Otherwise, similar findings of pulmonary edema and bibasilar opacities, left greater than right, atelectasis versus infiltrate. Critical Value/emergent results were called by telephone at the time of interpretation on 05/07/2017 at 12:29 pm to Dr. Marguerita Merles , who verbally acknowledged these results. Electronically Signed   By: Simonne Come M.D.   On: 05/07/2017 12:29   Ct Perc Pleural Drain W/indwell Cath W/img Guide  Result Date: 05/07/2017 INDICATION: Enlarging right-sided pneumothorax post thoracentesis. Please perform CT-guided chest tube placement. EXAM: CT PERC PLEURAL DRAIN W/INDWELL CATH W/IMG GUIDE COMPARISON:  Chest radiograph - earlier same day; 05/06/2017; 05/05/2017 MEDICATIONS: The patient is currently admitted to the hospital and receiving intravenous antibiotics. The antibiotics were administered within an appropriate time frame prior to the initiation of the procedure. ANESTHESIA/SEDATION: Moderate (conscious) sedation was employed during  this procedure. A total of Versed 0.5 mg and Fentanyl 12.5 mcg was administered intravenously. Moderate Sedation Time: 18 minutes. The patient's level of consciousness and vital signs were monitored continuously by radiology nursing throughout the procedure under my direct supervision. CONTRAST:  None COMPLICATIONS: None immediate. PROCEDURE: Informed written consent was obtained from the patient after a discussion of the risks, benefits and alternatives to treatment. The patient was placed supine on the CT gantry and a pre procedural CT was performed re-demonstrating the known right-sided hydropneumothorax. The procedure was planned. A timeout was performed prior to the initiation of the procedure. The skin overlying the ventral aspect the right upper chest was prepped and draped in the usual sterile fashion. The overlying soft tissues were anesthetized with 1% lidocaine with epinephrine. Appropriate trajectory was planned with the use of a 22 gauge spinal needle. An 18 gauge trocar needle was advanced into the right pleural space and a short Amplatz super stiff wire was coiled within the right pleural space. Appropriate positioning was confirmed with a limited CT scan. The tract was serially dilated allowing placement of a 10 [place] all-purpose drainage catheter. Appropriate positioning was confirmed with a limited postprocedural CT scan. Approximately 500 cc of serous fluid was aspirated following percutaneous drainage catheter placement. The tube was connected to a pleural vac device and sutured in place. A dressing was placed. The patient tolerated the procedure well without immediate post procedural complication. IMPRESSION: Successful CT guided placement of a 10 French all purpose drain catheter into the right pleural space for enlarging right-sided hydropneumothorax. Electronically Signed   By: Simonne Come M.D.   On: 05/07/2017 15:42   US Thoracentesis Asp Pleural Space W/img Guide  Result Date:  05/06/2017  INDICATION: History of alcoholic cirrhosis, now with recurrent symptomatic right-sided pleural effusions. Please perform ultrasound-guided thoracentesis for therapeutic purposes. EXAM: US THORACENTESIS ASP PLEURAL SPACE W/IMG GUIDE COMPARISON:  Chest radiograph - earlier same day ; ultrasound-guided thoracentesis - 03/19/2017; 02/15/2017; 01/20/2017 MEDICATIONS: None. COMPLICATIONS: SIR Level A - No therapy, no consequence. Procedure complicated by development of a moderate size ex vacuo pneumothorax. Patient was evaluated from the procedure reported improvement in her preprocedural shortness of breath. TECHNIQUE: Informed written consent was obtained from the patient after a discussion of the risks, benefits and alternatives to treatment. A timeout was performed prior to the initiation of the procedure. Initial ultrasound scanning demonstrates a large anechoic right-sided pleural effusion. The lower chest was prepped and draped in the usual sterile fashion. 1% lidocaine was used for local anesthesia. An ultrasound image was saved for documentation purposes. An 8 Fr Safe-T-Centesis catheter was introduced. The thoracentesis was performed. The catheter was removed and a dressing was applied. The patient tolerated the procedure well without immediate post procedural complication. The patient was escorted to have an upright chest radiograph. FINDINGS: A total of approximately 1.4 liters of serous fluid was removed. IMPRESSION: Successful ultrasound-guided right sided thoracentesis yielding 1.4 liters of pleural fluid. Electronically Signed   By: Simonne Come M.D.   On: 05/06/2017 14:44    Labs:  CBC:  Recent Labs  05/06/17 0608 05/07/17 0924 05/08/17 1023 05/09/17 0218  WBC 20.7* 14.1* 13.3* 17.0*  HGB 10.2* 11.4* 11.7* 9.2*  HCT 29.2* 33.5* 34.3* 27.8*  PLT 112* 84* PLATELETS APPEAR DECREASED 135*    COAGS:  Recent Labs  2017/05/26 1434 05/03/17 0209 05/04/17 0811 05/06/17 0608  INR  1.38 1.56 1.68 1.55  APTT  --  35  --   --     BMP:  Recent Labs  05/05/17 0250 05/06/17 0608 05/08/17 1023 05/09/17 0218  NA 121* 126* 129* 124*  K 4.7 4.9 5.8* 5.4*  CL 92* 95* 100* 93*  CO2 23 18* 18* 23  GLUCOSE 215* 127* 143* 114*  BUN 25* 35* 57* 53*  CALCIUM 8.6* 9.5 9.8 9.6  CREATININE 1.01* 0.95 1.15* 0.98  GFRNONAA >60 >60 53* >60  GFRAA >60 >60 >60 >60    LIVER FUNCTION TESTS:  Recent Labs  05/05/17 0250 05/06/17 0608 05/08/17 1023 05/09/17 0218  BILITOT 8.0* 7.1* 8.0* 6.0*  AST 62* 69* 63* 58*  ALT 67* 75* 74* 74*  ALKPHOS 102 114 106 109  PROT 6.3* 7.1 6.8 6.1*  ALBUMIN 1.8* 2.0* 2.0* 1.8*    Assessment and Plan: Right pneumothorax s/p thoracentesis 9/29, now s/p chest tube placement 9/30 Patient resting comfortably.  Husband at bedside. Chest tube remains in place.  Currently to water seal.  CXR this AM shows improvement, but continued small R PTX. Will discuss with MD.  IR to follow.   Electronically Signed: Hoyt Koch, PA 05/09/2017, 12:44 PM   I spent a total of 15 Minutes at the the patient's bedside AND on the patient's hospital floor or unit, greater than 50% of which was counseling/coordinating care for pneumothorax.

## 2017-05-09 NOTE — Clinical Social Work Note (Signed)
Clinical Social Work Assessment  Patient Details  Name: Rhonda Haley MRN: [id number] Date of Birth: 05-10-1966  Date of referral:  05/09/17               Reason for consult:  Facility Placement, Discharge Planning                Permission sought to share information with:  Facility Sport and exercise psychologist, Family Supports Permission granted to share information::  Yes, Verbal Permission Granted  Name::     Evadene Wardrip  Agency::  SNF's  Relationship::  Husband  Contact Information:  [phone]  Housing/Transportation Living arrangements for the past 2 months:  [place] of Information:  Medical Team, Parent, Spouse Patient Interpreter Needed:  None Criminal Activity/Legal Involvement Pertinent to Current Situation/Hospitalization:  No - Comment as needed Significant Relationships:  Spouse, Parents, Other Family Members Lives with:  Spouse Do you feel safe going back to the place where you live?  Yes Need for family participation in patient care:  Yes (Comment)  Care giving concerns:  PT recommending SNF once medically stable for discharge.   Social Worker assessment / plan:  Per RN, patient is not fully oriented. CSW met with patient's husband and father in the conference room. CSW introduced role and explained that PT recommendations would be discussed. Patient's husband and father agreeable to SNF placement. CSW provided SNF list and answered all questions. Patient still has chest tube. Will send out referral once chest tube is out to avoid denials. No further concerns. CSW encouraged patient's husband and father to contact CSW as needed. CSW will continue to follow patient for support and facilitate discharge to SNF once medically stable.  Employment status:  [employer] information:  Other (Comment Required) Sports administrator) PT Recommendations:  [place] / Referral to community resources:  [place]  Patient/Family's  Response to care:  Patient not fully oriented. Patient's husband and father agreeable to SNF placement. Patient's family supportive and involved in patient's care. Patient's husband and father appreciated social work intervention.  Patient/Family's Understanding of and Emotional Response to Diagnosis, Current Treatment, and Prognosis:  Patient not fully oriented. Patient's husband and father have a good understanding of the reason for admission and his need for rehab prior to returning home. Patient's husband and father appear happy with hospital care.  Emotional Assessment Appearance:  Appears stated age Attitude/Demeanor/Rapport:  Unable to Assess Affect (typically observed):  Unable to Assess Orientation:  Oriented to Self, Oriented to Situation Alcohol / Substance use:  Alcohol Use Psych involvement (Current and /or in the community):  No (Comment)  Discharge Needs  Concerns to be addressed:  Care Coordination Readmission within the last 30 days:  No Current discharge risk:  Cognitively Impaired, Dependent with Mobility Barriers to Discharge:  Continued Medical Work up   Candie Chroman, LCSW 05/09/2017, 2:54 PM

## 2017-05-09 NOTE — Progress Notes (Signed)
PROGRESS NOTE    Rhonda Haley  OZH:[id number] DOB: 05/06/1966 DOA: 04/11/2017 PCP: Lewis Moccasin, MD   Brief Narrative: Rhonda Haley is a 51 y.o. female with medical history significant for alcoholic cirrhosis with associated ascites, recurrent right hepatic hydrothorax, chronic hyponatremia, protein calorie malnutrition and anemia. Patient had been started on low-dose prednisone (03/21/17) for acute alcoholic hepatitis in the setting of weakly positive autoimmune markers. Over the past 2 weeks prednisone has been tapered to off with last dosage on 9/24. Patient has had asymptomatic leukocytosis dating back to 8/21 where her white count was documented at 23,300 with repeat CBC on 9/12 white count 29,900. On the morning of admission patient awakened with generalized malaise, weakness, chills and subjective fevers. She was not having any abdominal pain, nausea vomiting or diarrhea. She is not had any coughing or sick contacts. She followed up with the GI office she was noted to have a low-grade temperature of 100.4, heart rate was 140, BP was 90/56. Because of the symptoms she was sent to the ER for further evaluation.   In the ER she continued with low-grade temperature and tachycardia as well as tachypnea with a low but normal blood pressure readings. Her white count was 20,700 with left shift as previous. Her lactic acid was 2.15. Her sodium was 122. Abdominal exam was benign. Chest x-ray revealed significant right pleural effusion and right basilar opacity. EDP is concerned over possible HCAP and requests evaluation for observation admission. She is not hypoxic at rest. She does report dyspnea on exertion which is chronic. She subsequently underwent emergent Thoracentesis and was transferred to SDU. She is improved and GI was consulted for further evaluation and Recc's. PCCM following for her Respiratory Failure. Patient was started back on Diuretics last night. Felt a little SOB this AM and repeat CXR  did not show any worsening of Pleural Effusion. Started Patient on IV Steroids with Solumedrol 40 mg q12h on 05/04/17 and patient improving. Repeat Thoracentesis done by Dr. Grace Isaac and patient had an ex-vacuo pneumothorax after but remained stable.   Overnight the patient appeared to be withdrawing from EtOH so she was placed on CIWA protocol and received Ativan early this AM. The Pneumothorax appeared to get larger today on repeat X-Ray so Dr. Grace Isaac of IR placed a CT Guided 10 French Drainage catheter into the Right Pleural Space yielding 550 cc of additional serous pleural fluid.   PT evaluated and feel as if she needs SNF. Gastroenterology adding Rifaximin for Encephalopathy and Current dose of Lactulose increased. GI also increased Aldactone to 125 mg Daily ad Lasix to 60 mg po Daily.   I spoke with Dr. Miles Costain in IR and plan is to keep Chest Tube in place until improved. Patient will need SNF at D/C.   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   Pleural effusion on right 2/2 hepatic hydrothorax   Anemia   Protein calorie malnutrition (HCC)   Chronic hyponatremia   HCAP (healthcare-associated pneumonia)   Murmur, cardiac   Acute respiratory distress   Pleural effusion associated with hepatic disorder   Hyperkalemia   AKI (acute kidney injury) (HCC)  Sepsis 2/2 presumed HCAP (healthcare-associated pneumonia) -Presented with less than 24  hours of fevers, chills, generalized weakness tachycardia and tachypnea with relative hypotension -Persistent Leukocytosis since 8/21 as patient has been on Steroids and WBC now 17.0 -Mild elevation in serum lactate and went from 2.15 -> 1.5 -> 2.4 -CXR 05/04/17 showed Large  right pleural effusion stable since yesterday's post thoracentesis radiograph. Diffuse alveolar opacities in the left lung are worrisome for pneumonia or pulmonary edema -C/w Empiric Cefepime/Vancomycin IV (Day 8 of 10)  -Follow up on blood cultures (NGTD at 5 Days); Urine Cx shows multiple species present; Body Fluid Cx shows NGTD at 3 Days -Obtain sputum culture -Urinary Legionella and Strep Pneumo Urine Ag Negative  -Cycled lactic acid and went to 2.4 -Procalcitonin 0.88 and improved to 0.75 -Respiratory viral panel and influenza PCR Negative  -NS at 125/hr Stopped as patient became symptomatic and had yohave Thoracentesis  -D/C'd IV Solumedrol 40 q12h -C/w Supplemental O2 -PCCM reconsulted by Gastroenterology and PCCM ordering CT Chest to r/o loculations and define infiltrate in Left Lung -PCCM to place pateint on 100% O2 overnight to attempt to decrease the size of the Pneumothroax and anticipate pulling Chest Tube in AM  Acute Hypoxic Respiratory Failure from HCAP and Pleural effusion on right 2/2 hepatic hydrothorax s/p Thoracentesis x2 and now Chest Tube -C/w Supplemental O2 and wean as tolerated -Follow Cx Data -As Above  Pleural Effusion on right 2/2 hepatic hydrothorax s/p Thoracentesis x 2 and now CT guided Chest Tube -Underwent right thoracentesis on 8/12 with 2.6 L of clear yellow fluid removed and underwent Thoracentesis on 9/26 and had 1.2 Liters removed. IVF stopped and given IV Lasix 40 mg Once -Patient reports chronic dyspnea on exertion therefore given recurrence of hydrothorax will obtain ambulatory oximetry in the event patient may benefit from home O2 in AM -PCCM Consulted and following and appreciate Recc's; Recommending continuing Abx and perform further Thoracentesis if Significant Dyspnena  -Pleural Effusion was Transudative  -Repeat Thoracentesis ordered yesterday and done today 05/06/17 and Dr. Grace Isaac Removed 1.4 Liters; Discussed with Dr. Grace Isaac as post  Thoracentesis CXR showed Interval development of a small presumably ex vacuo pneumothorax post right-sided thoracentesis. Near complete resolution of right-sided pleural effusion post thoracentesis. Similar findings of pulmonary edema with worsening left basilar opacities, atelectasis versus infiltrate -Repeat CXR 9/30 showed Continued increase in size of moderate-sized right-sided pneumothorax, now with minimal amount of right to left mediastinal shift. Similar findings suggestive of pulmonary edema and bibasilar opacities, left greater than right, atelectasis versus infiltrate. -A decision was made to place a CT Chest Tube by IR Radiologist Dr. Grace Isaac and patient had 550 cc of serous fluid drained some more. -Repeat CXR 10/1 showed Decreased size of what continues to be a large right-sided hydropneumothorax following right-sided chest tube placement. The continues to be some evidence of mild right to left midline shift, suggesting some tension. Persistent asymmetrically distributed multifocal interstitial and airspace disease throughout all aspects of the lungs bilaterally (left greater than right), concerning for multilobar pneumonia  -CXR this AM showed Persistent chest tube on the right without change in position. The pneumothorax on the right appears smaller compared to 1 day prior.There may be mild tension component, stable. There remains sizable partially loculated pleural effusion on the right. There is a degree of underlying edema, likely due to underlying congestive heart failure. Patchy infiltrate on the left potentially could represent superimposed pneumonia. These changes are stable. The cardiac silhouette is stable. -Per my discussion with IR Dr. Miles Costain, plan is to leave CT in for today  and re-evaluate CXR in AM and decide whether to remove then or not -PCCM consulted by Gastroenterology and ordering CT Chest to r/o loculations and define infiltrate in Left Lung -PCCM to place pateint on 100%  O2 overnight to attempt to decrease the size of the Pneumothroax and anticipate pulling Chest Tube in AM  Decompensated Alcoholic Cirrhosis of liver with Ascites w/weakly + autoimmune markers -Briefly seen at the GI office day of admission and then directed to the ED -Has been on Lasix and spironolactone with recent dosage adjustments -AST mildly elevated at 63 and ALT mildly elevated at 74; Recently had EtOH last week -At time of discharge dry weight documented at 122 lbs with current weight 115 lbs -Patient may be somewhat volume depleted and this could be contributing to her symptoms -Completed Prednisone taper on 9/24; Per GI will not need Steroids now as Taper was stopped -Minimal ascites and no abdominal pain so do not think presenting symptoms consistent with SBP (Canceled IR paracentesis previously ordered but made patient NPO after midnight in the event of the procedure such as thoracentesis indicated) -Gastroenterology consulted and recommending continuing Abx and resume Regular Diet with Protein Supplements -GI recommended resuming Prednisolone Taper initially but she had already completed Taper and does not need steroids for EtOH Hepatitis -Gastroenterology recommends continuing 125 mg of Aldactone (home dose 200) and 60 mg of Lasix  -C/w Low Sodium Diet; Lactulose increased to 20 grams po BID a few Days ago -Per now repeat Ammonia Levels and was 49 this AM; Having 2 bowel movements -GI aslo adding Rifaximin 550 mg po BID  -C/w MVI, Folic Acid and Thiamine  -ECHO showed EF of 70-75% with Grade 1 DD -It appeared Patient was withdrawing last night so she was given CIWA Protocol early this AM -C/w CIWA  Chronic Hyponatremia -Baseline appears to be around 124-125 -Current sodium 124 -Diuretics adjusted as above -Follow labs and repeat CMP in AM   Murmur, Cardiac -Has loud systolic murmur which was not documented during previous admission -Echocardiogram from July with normal  systolic function and normal diastolic function without valvular abnormalities -Give the new murmur and fevers will obtain echocardiogram-need to follow-up on blood cultures as well -Showed EF of 60-65% -Follow up with Cardiology as an outpatient  Anemia of Chronic Disease and IDA -Hemoglobin stable and at baseline -Hb/Hct went from 9.3/28.2 -> 10.4/30.9 -> 9.2/27.9 -> 10.2/29.2 -> 11.4/33.5 -> 11.7/34.3 -> 9.2/27.8 -C/w Ferrous Sulfate 325 mg po BID  -Continue to Monitor for S/Sx of Bleeding  Protein Calorie Malnutrition  -Continue preadmission Ensure -Albumin is 2.0 -Nutritionist Consulted for futher reccs  Thrombocytopenia -Likely from EtOH; -Platelet Count now 135 -Continue to Monitor closely and repeat CBC in AM   Hyperbilirubinemia -Likely from EtOHism and Decompensated Liver Cirrhosis with Ascites -Bilirubin yesterday AM was 6.0 -Continue to Monitor and repeat CMP in AM   Aortic Valve Thickening -ECHO showed The non-coronary cusp of the aortic valve seems thickened and reduncdant. There is mild ootflow obstuction with this.  -No AI.  -Would consider TEE to further evaluate but will discuss with Cardiology at some point  Leukocytosis -Likely Reactive from Steroid Demargination -Patient's WBC was 21.8 and went to 17.0 -Continue to Treat Pneumonia -D/C'd IV Solumedrol -Repeat CBC with Diff in AM  Worsening BUN/Cr and mild AKI, improved -?Hepatorenal Syndrome or worsening from Diuretics -BUN/Cr went from 57/1.15 -> 53/0.98 -Continue to Watch Carefully as GI increased Spironolactone and Lasix  Hyperkalemia -Patient's K+ was 5.4 this AM;  Was 5.8 yesterday  -Given Kayexalate 30 mg x 1 again -Repeat CMP in AM   DVT prophylaxis: SCDs given Anemia and Thrombocytopenia  Code Status: FULL CODE Family Communication: Discussed with patient's Father  Disposition Plan: Remain in SDU for now and likely SNF at D/C when stable  Consultants:   PCCM; PCCM reconsulted by GI  today   Gastroenterology   IR Dr. Grace Isaac  Procedures: Thoracentesis with removal of 1.2 Liters of Fluid 9/26  ECHOCARDIOGRAM Study Conclusions  - Left ventricle: The cavity size was normal. Wall thickness was   normal. Systolic function was hyperdynamic. The estimated   ejection fraction was in the range of 70% to 75%. Wall motion was   normal; there were no regional wall motion abnormalities. Doppler   parameters are consistent with abnormal left ventricular   relaxation (grade 1 diastolic dysfunction). - Aortic valve: The non-coronary cusp of the aortic valve seems   thickened and reduncdant. There is mild ootflow obstuction with   this. No AI. Would consider TEE to further evalaute. Valve area   (VTI): 1.72 cm^2. Valve area (Vmax): 1.78 cm^2. Valve area   (Vmean): 1.82 cm^2. - Mitral valve: Valve area by pressure half-time: 2.18 cm^2.  U/S Guided Right Side Thoracentesis yielding 1.4 Liters of Serous Pleural Fluid on 9/29 done by Dr. Grace Isaac.   CT Guided Chest Tube on 9/30 for Pneumothorax   Antimicrobials: Anti-infectives    Start     Dose/Rate Route Frequency Ordered Stop   05/09/17 0800  ceFEPIme (MAXIPIME) 1 g in dextrose 5 % 50 mL IVPB     1 g 100 mL/hr over 30 Minutes Intravenous Every 12 hours 05/08/17 1543 05/10/17 2359   05/08/17 1500  rifaximin (XIFAXAN) tablet 550 mg     550 mg Oral 2 times daily 05/08/17 1417     05/06/17 2200  vancomycin (VANCOCIN) IVPB 1000 mg/200 mL premix     1,000 mg 200 mL/hr over 60 Minutes Intravenous Every 24 hours 05/06/17 1224     05-05-2017 2200  ceFEPIme (MAXIPIME) 1 g in dextrose 5 % 50 mL IVPB  Status:  Discontinued     1 g 100 mL/hr over 30 Minutes Intravenous Every 8 hours May 05, 2017 2055 05/08/17 1543   2017-05-05 2130  vancomycin (VANCOCIN) IVPB 750 mg/150 ml premix  Status:  Discontinued     750 mg 150 mL/hr over 60 Minutes Intravenous Every 12 hours 05/05/17 2102 05/06/17 1213   05-05-2017 1615  vancomycin (VANCOCIN) IVPB 1000  mg/200 mL premix     1,000 mg 200 mL/hr over 60 Minutes Intravenous  Once May 05, 2017 1614 2017-05-05 1736   05-05-17 1615  piperacillin-tazobactam (ZOSYN) IVPB 3.375 g     3.375 g 100 mL/hr over 30 Minutes Intravenous  Once 05-May-2017 1614 2017-05-05 1758     Subjective: Seen and examined at bedside and was sitting at bedside and extremely alert. Knew she was at [facility] and knew the year and President. No complaints and was breathing well without supplemental O2. No CP. Patient feels better. No other concerns or complaints at this time. Chest Tube remains in place.   Objective: Vitals:   05/09/17 0811 05/09/17 1200 05/09/17 1403 05/09/17 1515  BP: (!) 90/52 (!) 88/47    Pulse: 91 95 96   Resp: (!) 21  (!) 26   Temp: 97.6 F (36.4 C)   (!) 97.5 F (36.4 C)  TempSrc: Oral   Axillary  SpO2: 99%  99%   Weight:  Height:        Intake/Output Summary (Last 24 hours) at 05/09/17 1520 Last data filed at 05/09/17 1300  Gross per 24 hour  Intake              360 ml  Output               37 ml  Net              323 ml   Filed Weights   05/07/17 0400 05/08/17 0500 05/09/17 0500  Weight: 48.2 kg (106 lb 4.2 oz) 48.5 kg (106 lb 14.8 oz) 49.2 kg (108 lb 7.5 oz)   Examination: Physical Exam:  Constitutional: Thin frail jaundiced and chronically ill appearing Caucasian female in NAD sitting bedside Eyes: Sclerae are icteric. Lids normal ENMT: External ears and nose appear normal. MMM Neck: Supple with no JVD Respiratory: Diminished with scattered crackles. No appreciable wheezing or rhonchi. Right Chest Tube in Place Cardiovascular: RRR; Loud 3/6 Systolic Murmur. No appreciable edema Abdomen: Soft, NT, ND. Bowel sounds present GU: Deferred Musculoskeletal: No contractures; No cyanosis Skin: Jaundiced female. Has scattered telangectasias noted on chest. Warm and dry Neurologic: CN 2-12 grossly intact. No appreciable focal deficits Psychiatric: Awake and alert and oriented x2. Pleasant  mood and affect  Data Reviewed: I have personally reviewed following labs and imaging studies  CBC:  Recent Labs Lab 05/05/17 0250 05/06/17 0608 05/07/17 0924 05/08/17 1023 05/09/17 0218  WBC 20.3* 20.7* 14.1* 13.3* 17.0*  NEUTROABS 19.2* 18.3* 11.7* 10.5* 13.8*  HGB 9.2* 10.2* 11.4* 11.7* 9.2*  HCT 27.9* 29.2* 33.5* 34.3* 27.8*  MCV 100.0 98.6 101.2* 101.5* 100.0  PLT 113* 112* 84* PLATELETS APPEAR DECREASED 135*   Basic Metabolic Panel:  Recent Labs Lab 05/04/17 0811 05/05/17 0250 05/06/17 0608 05/08/17 1023 05/09/17 0218  NA 124* 121* 126* 129* 124*  K 4.7 4.7 4.9 5.8* 5.4*  CL 94* 92* 95* 100* 93*  CO2 17* 23 18* 18* 23  GLUCOSE 87 215* 127* 143* 114*  BUN 17 25* 35* 57* 53*  CREATININE 0.91 1.01* 0.95 1.15* 0.98  CALCIUM 8.4* 8.6* 9.5 9.8 9.6  MG 1.8 1.9 2.2 2.6* 2.4  PHOS 4.5 3.9 3.8 6.0* 2.9   GFR: Estimated Creatinine Clearance: 51.6 mL/min (by C-G formula based on SCr of 0.98 mg/dL). Liver Function Tests:  Recent Labs Lab 05/04/17 0811 05/05/17 0250 05/06/17 0608 05/08/17 1023 05/09/17 0218  AST 81* 62* 69* 63* 58*  ALT 78* 67* 75* 74* 74*  ALKPHOS 89 102 114 106 109  BILITOT 9.6* 8.0* 7.1* 8.0* 6.0*  PROT 6.7 6.3* 7.1 6.8 6.1*  ALBUMIN 2.0* 1.8* 2.0* 2.0* 1.8*   No results for input(s): LIPASE, AMYLASE in the last 168 hours.  Recent Labs Lab 05/04/17 0811 05/09/17 0218  AMMONIA 43* 49*   Coagulation Profile:  Recent Labs Lab 05/03/17 0209 05/04/17 0811 05/06/17 0608  INR 1.56 1.68 1.55   Cardiac Enzymes: No results for input(s): CKTOTAL, CKMB, CKMBINDEX, TROPONINI in the last 168 hours. BNP (last 3 results) No results for input(s): PROBNP in the last 8760 hours. HbA1C: No results for input(s): HGBA1C in the last 72 hours. CBG: No results for input(s): GLUCAP in the last 168 hours. Lipid Profile: No results for input(s): CHOL, HDL, LDLCALC, TRIG, CHOLHDL, LDLDIRECT in the last 72 hours. Thyroid Function Tests: No results  for input(s): TSH, T4TOTAL, FREET4, T3FREE, THYROIDAB in the last 72 hours. Anemia Panel: No results for input(s): VITAMINB12, FOLATE, FERRITIN, TIBC,  IRON, RETICCTPCT in the last 72 hours. Sepsis Labs:  Recent Labs Lab 04/25/2017 1817 04/25/2017 2056 05/04/17 1151 05/06/17 0608  PROCALCITON  --   --  0.88 0.75  LATICACIDVEN 1.5 2.4*  --   --     Recent Results (from the past 240 hour(s))  Blood culture (routine x 2)     Status: None   Collection Time: 04/24/2017  2:20 PM  Result Value Ref Range Status   Specimen Description BLOOD LEFT ANTECUBITAL  Final   Special Requests   Final    BOTTLES DRAWN AEROBIC AND ANAEROBIC Blood Culture adequate volume   Culture NO GROWTH 5 DAYS  Final   Report Status 05/07/2017 FINAL  Final  Blood culture (routine x 2)     Status: None   Collection Time: 05/01/2017  2:25 PM  Result Value Ref Range Status   Specimen Description BLOOD LEFT HAND  Final   Special Requests IN PEDIATRIC BOTTLE Blood Culture adequate volume  Final   Culture NO GROWTH 5 DAYS  Final   Report Status 05/07/2017 FINAL  Final  Urine culture     Status: Abnormal   Collection Time: 05/07/2017  2:55 PM  Result Value Ref Range Status   Specimen Description URINE, CLEAN CATCH  Final   Special Requests NONE  Final   Culture MULTIPLE SPECIES PRESENT, SUGGEST RECOLLECTION (A)  Final   Report Status 05/04/2017 FINAL  Final  Respiratory Panel by PCR     Status: None   Collection Time: 05/01/2017 11:29 PM  Result Value Ref Range Status   Adenovirus NOT DETECTED NOT DETECTED Final   Coronavirus 229E NOT DETECTED NOT DETECTED Final   Coronavirus HKU1 NOT DETECTED NOT DETECTED Final   Coronavirus NL63 NOT DETECTED NOT DETECTED Final   Coronavirus OC43 NOT DETECTED NOT DETECTED Final   Metapneumovirus NOT DETECTED NOT DETECTED Final   Rhinovirus / Enterovirus NOT DETECTED NOT DETECTED Final   Influenza A NOT DETECTED NOT DETECTED Final   Influenza B NOT DETECTED NOT DETECTED Final    Parainfluenza Virus 1 NOT DETECTED NOT DETECTED Final   Parainfluenza Virus 2 NOT DETECTED NOT DETECTED Final   Parainfluenza Virus 3 NOT DETECTED NOT DETECTED Final   Parainfluenza Virus 4 NOT DETECTED NOT DETECTED Final   Respiratory Syncytial Virus NOT DETECTED NOT DETECTED Final   Bordetella pertussis NOT DETECTED NOT DETECTED Final   Chlamydophila pneumoniae NOT DETECTED NOT DETECTED Final   Mycoplasma pneumoniae NOT DETECTED NOT DETECTED Final  Body fluid culture     Status: None   Collection Time: 05/03/17  2:10 AM  Result Value Ref Range Status   Specimen Description PLEURAL RIGHT  Final   Special Requests NONE  Final   Gram Stain NO WBC SEEN NO ORGANISMS SEEN   Final   Culture NO GROWTH 3 DAYS  Final   Report Status 05/06/2017 FINAL  Final  MRSA PCR Screening     Status: None   Collection Time: 05/09/17 11:57 AM  Result Value Ref Range Status   MRSA by PCR NEGATIVE NEGATIVE Final    Comment:        The GeneXpert MRSA Assay (FDA approved for NASAL specimens only), is one component of a comprehensive MRSA colonization surveillance program. It is not intended to diagnose MRSA infection nor to guide or monitor treatment for MRSA infections.     Radiology Studies: Dg Chest 2 View  Result Date: 05/08/2017 CLINICAL DATA:  51 year old female with history of right-sided hemothorax. Evaluate  chest tube physician. EXAM: CHEST  2 VIEW COMPARISON:  Chest x-ray a 05/07/2017. FINDINGS: Interval placement of a small bore right-sided chest tube with pigtail reformed in the upper right hemithorax. Previously noted large right-sided hydropneumothorax has decreased slightly in size, currently a occupying approximately 50% of the volume of the right hemithorax. Widespread interstitial and airspace disease again noted throughout both lungs (left greater than right). Heart size is normal. Mediastinal contours remain slightly displaced to the left, suggesting potential tension component.  IMPRESSION: 1. Decreased size of what continues to be a large right-sided hydropneumothorax following right-sided chest tube placement. The continues to be some evidence of mild right to left midline shift, suggesting some tension. 2. Persistent asymmetrically distributed multifocal interstitial and airspace disease throughout all aspects of the lungs bilaterally (left greater than right), concerning for multilobar pneumonia. Electronically Signed   By: Trudie Reed M.D.   On: 05/08/2017 07:44   Dg Chest Port 1 View  Result Date: 05/09/2017 CLINICAL DATA:  Pleural effusion with chest tube in place EXAM: PORTABLE CHEST 1 VIEW COMPARISON:  May 08, 2017 FINDINGS: Chest tube remains on the right. There is a pneumothorax on the right which appears smaller compared to 1 day prior. There may be a slight tension component given slight shift of heart and mediastinum toward the left. This finding is stable. There is a fairly sizable partially loculated pleural effusion on the right. There is underlying interstitial and patchy alveolar edema bilaterally. No new opacity is evident. Heart is upper normal in size with a degree of pulmonary venous hypertension evident. No evident adenopathy. No bone lesions. IMPRESSION: Persistent chest tube on the right without change in position. The pneumothorax on the right appears smaller compared to 1 day prior. There may be mild tension component, stable. There remains sizable partially loculated pleural effusion on the right. There is a degree of underlying edema, likely due to underlying congestive heart failure. Patchy infiltrate on the left potentially could represent superimposed pneumonia. These changes are stable. The cardiac silhouette is stable. Electronically Signed   By: Bretta Bang III M.D.   On: 05/09/2017 07:46   Ct Perc Pleural Drain W/indwell Cath W/img Guide  Result Date: 05/07/2017 INDICATION: Enlarging right-sided pneumothorax post thoracentesis.  Please perform CT-guided chest tube placement. EXAM: CT PERC PLEURAL DRAIN W/INDWELL CATH W/IMG GUIDE COMPARISON:  Chest radiograph - earlier same day; 05/06/2017; 05/05/2017 MEDICATIONS: The patient is currently admitted to the hospital and receiving intravenous antibiotics. The antibiotics were administered within an appropriate time frame prior to the initiation of the procedure. ANESTHESIA/SEDATION: Moderate (conscious) sedation was employed during this procedure. A total of Versed 0.5 mg and Fentanyl 12.5 mcg was administered intravenously. Moderate Sedation Time: 18 minutes. The patient's level of consciousness and vital signs were monitored continuously by radiology nursing throughout the procedure under my direct supervision. CONTRAST:  None COMPLICATIONS: None immediate. PROCEDURE: Informed written consent was obtained from the patient after a discussion of the risks, benefits and alternatives to treatment. The patient was placed supine on the CT gantry and a pre procedural CT was performed re-demonstrating the known right-sided hydropneumothorax. The procedure was planned. A timeout was performed prior to the initiation of the procedure. The skin overlying the ventral aspect the right upper chest was prepped and draped in the usual sterile fashion. The overlying soft tissues were anesthetized with 1% lidocaine with epinephrine. Appropriate trajectory was planned with the use of a 22 gauge spinal needle. An 18 gauge trocar needle was advanced  into the right pleural space and a short Amplatz super stiff wire was coiled within the right pleural space. Appropriate positioning was confirmed with a limited CT scan. The tract was serially dilated allowing placement of a 10 [place] all-purpose drainage catheter. Appropriate positioning was confirmed with a limited postprocedural CT scan. Approximately 500 cc of serous fluid was aspirated following percutaneous drainage catheter placement. The tube was connected to  a pleural vac device and sutured in place. A dressing was placed. The patient tolerated the procedure well without immediate post procedural complication. IMPRESSION: Successful CT guided placement of a 10 French all purpose drain catheter into the right pleural space for enlarging right-sided hydropneumothorax. Electronically Signed   By: Simonne Come M.D.   On: 05/07/2017 15:42   Scheduled Meds: . escitalopram  20 mg Oral Daily  . feeding supplement (ENSURE ENLIVE)  237 mL Oral TID BM  . ferrous sulfate  325 mg Oral BID WC  . folic acid  1 mg Oral Daily  . furosemide  60 mg Oral Daily  . lactulose  20 g Oral BID  . mouth rinse  15 mL Mouth Rinse BID  . Melatonin  6 mg Oral QHS  . multivitamin with minerals  1 tablet Oral Daily  . pantoprazole  20 mg Oral QAC breakfast  . rifaximin  550 mg Oral BID  . sodium chloride flush  3 mL Intravenous Q12H  . spironolactone  125 mg Oral Daily  . thiamine injection  100 mg Intravenous Daily   Or  . thiamine  100 mg Oral Daily   Continuous Infusions: . ceFEPime (MAXIPIME) IV Stopped (05/09/17 0807)  . vancomycin Stopped (05/08/17 2310)    LOS: 6 days    Merlene Laughter, DO [facility] Pager [phone]  If 7PM-7AM, please contact night-coverage [email or web address] Password [email or web address] 05/09/2017, 3:20 PM

## 2017-05-09 NOTE — Progress Notes (Signed)
Daily Rounding Note  05/09/2017, 12:20 PM  LOS: 6 days   SUBJECTIVE:   Chief complaint: confusion is better today.  Appropriate and fully oriented but occasionally blurts out inappropriate words that are irrelevant to stream of conversation.  Almost like a Tourettes without profanity.  Had at least 2 BMs today.       OBJECTIVE:         Vital signs in last 24 hours:    Temp:  [97.6 F (36.4 C)-98.3 F (36.8 C)] 97.6 F (36.4 C) (10/02 0811) Pulse Rate:  [85-99] 91 (10/02 0811) Resp:  [21-29] 21 (10/02 0811) BP: (83-96)/(48-57) 90/52 (10/02 0811) SpO2:  [99 %-100 %] 99 % (10/02 0811) Weight:  [49.2 kg (108 lb 7.5 oz)] 49.2 kg (108 lb 7.5 oz) (10/02 0500) Last BM Date: 05/09/17 Filed Weights   05/07/17 0400 05/08/17 0500 05/09/17 0500  Weight: 48.2 kg (106 lb 4.2 oz) 48.5 kg (106 lb 14.8 oz) 49.2 kg (108 lb 7.5 oz)   General: looks more alert, still jaundiced   Heart: Tachy, bounding PMI bounding Chest: diminished BS on right base, some dyspnea with prolonged speaKing.  No cough.  Fine guaged chest tube in position on right lateral thorax Abdomen: soft, NT, ND.  Active BS  Extremities: no CCE Neuro/Psych:  Pleasant, alert, appropriate.  Less UE tremor, no asterixis.  Overall quite weak.   Derm: telangectasia on trunk and face. Jaundiced somewhat masked but tan.    Intake/Output from previous day: 10/01 0701 - 10/02 0700 In: 3 [I.V.:3] Out: 2 [Chest Tube:2]  Intake/Output this shift: Total I/O In: 120 [P.O.:120] Out: -   Lab Results:  Recent Labs  05/07/17 0924 05/08/17 1023 05/09/17 0218  WBC 14.1* 13.3* 17.0*  HGB 11.4* 11.7* 9.2*  HCT 33.5* 34.3* 27.8*  PLT 84* PLATELETS APPEAR DECREASED 135*   BMET  Recent Labs  05/08/17 1023 05/09/17 0218  NA 129* 124*  K 5.8* 5.4*  CL 100* 93*  CO2 18* 23  GLUCOSE 143* 114*  BUN 57* 53*  CREATININE 1.15* 0.98  CALCIUM 9.8 9.6   LFT  Recent Labs  05/08/17 1023 05/09/17 0218  PROT 6.8 6.1*  ALBUMIN 2.0* 1.8*  AST 63* 58*  ALT 74* 74*  ALKPHOS 106 109  BILITOT 8.0* 6.0*    Studies/Results: Dg Chest 2 View  Result Date: 05/08/2017 CLINICAL DATA:  51 year old female with history of right-sided hemothorax. Evaluate chest tube physician. EXAM: CHEST  2 VIEW COMPARISON:  Chest x-ray a 05/07/2017. FINDINGS: Interval placement of a small bore right-sided chest tube with pigtail reformed in the upper right hemithorax. Previously noted large right-sided hydropneumothorax has decreased slightly in size, currently a occupying approximately 50% of the volume of the right hemithorax. Widespread interstitial and airspace disease again noted throughout both lungs (left greater than right). Heart size is normal. Mediastinal contours remain slightly displaced to the left, suggesting potential tension component. IMPRESSION: 1. Decreased size of what continues to be a large right-sided hydropneumothorax following right-sided chest tube placement. The continues to be some evidence of mild right to left midline shift, suggesting some tension. 2. Persistent asymmetrically distributed multifocal interstitial and airspace disease throughout all aspects of the lungs bilaterally (left greater than right), concerning for multilobar pneumonia. Electronically Signed   By: Trudie Reed M.D.   On: 05/08/2017 07:44   Dg Chest Port 1 View  Result Date: 05/09/2017 CLINICAL DATA:  Pleural effusion with chest tube in  place EXAM: PORTABLE CHEST 1 VIEW COMPARISON:  May 08, 2017 FINDINGS: Chest tube remains on the right. There is a pneumothorax on the right which appears smaller compared to 1 day prior. There may be a slight tension component given slight shift of heart and mediastinum toward the left. This finding is stable. There is a fairly sizable partially loculated pleural effusion on the right. There is underlying interstitial and patchy alveolar edema bilaterally.  No new opacity is evident. Heart is upper normal in size with a degree of pulmonary venous hypertension evident. No evident adenopathy. No bone lesions. IMPRESSION: Persistent chest tube on the right without change in position. The pneumothorax on the right appears smaller compared to 1 day prior. There may be mild tension component, stable. There remains sizable partially loculated pleural effusion on the right. There is a degree of underlying edema, likely due to underlying congestive heart failure. Patchy infiltrate on the left potentially could represent superimposed pneumonia. These changes are stable. The cardiac silhouette is stable. Electronically Signed   By: Bretta Bang III M.D.   On: 05/09/2017 07:46   Dg Chest Port 1 View  Result Date: 05/07/2017 CLINICAL DATA:  Right-sided pneumothorax. EXAM: PORTABLE CHEST 1 VIEW COMPARISON:  Chest radiograph - earlier same day; 05/06/2017; 05/04/2017 FINDINGS: Grossly unchanged cardiac silhouette and mediastinal contours with atherosclerotic plaque within the thoracic aorta. There has been continued increase in size of right-sided pneumothorax, now with minimal amount of right to left mediastinal shift. Pulmonary vasculature remains indistinct with cephalization of flow. Unchanged small layering right-sided effusion. No acute osseus abnormalities. IMPRESSION: 1. Continued increase in size of moderate-sized right-sided pneumothorax, now with minimal amount of right to left mediastinal shift. 2. Similar findings suggestive of pulmonary edema and bibasilar opacities, left greater than right, atelectasis versus infiltrate. PLAN: Given suspected continued increase in size of right-sided pneumothorax, the decision was made to proceed with CT-guided chest tube placement. Electronically Signed   By: Simonne Come M.D.   On: 05/07/2017 14:07   Ct Perc Pleural Drain W/indwell Cath W/img Guide  Result Date: 05/07/2017 INDICATION: Enlarging right-sided pneumothorax  post thoracentesis. Please perform CT-guided chest tube placement. EXAM: CT PERC PLEURAL DRAIN W/INDWELL CATH W/IMG GUIDE COMPARISON:  Chest radiograph - earlier same day; 05/06/2017; 05/05/2017 MEDICATIONS: The patient is currently admitted to the hospital and receiving intravenous antibiotics. The antibiotics were administered within an appropriate time frame prior to the initiation of the procedure. ANESTHESIA/SEDATION: Moderate (conscious) sedation was employed during this procedure. A total of Versed 0.5 mg and Fentanyl 12.5 mcg was administered intravenously. Moderate Sedation Time: 18 minutes. The patient's level of consciousness and vital signs were monitored continuously by radiology nursing throughout the procedure under my direct supervision. CONTRAST:  None COMPLICATIONS: None immediate. PROCEDURE: Informed written consent was obtained from the patient after a discussion of the risks, benefits and alternatives to treatment. The patient was placed supine on the CT gantry and a pre procedural CT was performed re-demonstrating the known right-sided hydropneumothorax. The procedure was planned. A timeout was performed prior to the initiation of the procedure. The skin overlying the ventral aspect the right upper chest was prepped and draped in the usual sterile fashion. The overlying soft tissues were anesthetized with 1% lidocaine with epinephrine. Appropriate trajectory was planned with the use of a 22 gauge spinal needle. An 18 gauge trocar needle was advanced into the right pleural space and a short Amplatz super stiff wire was coiled within the right pleural space. Appropriate positioning  was confirmed with a limited CT scan. The tract was serially dilated allowing placement of a 10 [place] all-purpose drainage catheter. Appropriate positioning was confirmed with a limited postprocedural CT scan. Approximately 500 cc of serous fluid was aspirated following percutaneous drainage catheter placement. The  tube was connected to a pleural vac device and sutured in place. A dressing was placed. The patient tolerated the procedure well without immediate post procedural complication. IMPRESSION: Successful CT guided placement of a 10 French all purpose drain catheter into the right pleural space for enlarging right-sided hydropneumothorax. Electronically Signed   By: Simonne Come M.D.   On: 05/07/2017 15:42    Scheduled Meds: . escitalopram  20 mg Oral Daily  . feeding supplement (ENSURE ENLIVE)  237 mL Oral TID BM  . ferrous sulfate  325 mg Oral BID WC  . folic acid  1 mg Oral Daily  . furosemide  60 mg Oral Daily  . lactulose  20 g Oral BID  . mouth rinse  15 mL Mouth Rinse BID  . Melatonin  6 mg Oral QHS  . multivitamin with minerals  1 tablet Oral Daily  . pantoprazole  20 mg Oral QAC breakfast  . rifaximin  550 mg Oral BID  . sodium chloride flush  3 mL Intravenous Q12H  . spironolactone  125 mg Oral Daily  . thiamine injection  100 mg Intravenous Daily   Or  . thiamine  100 mg Oral Daily   Continuous Infusions: . ceFEPime (MAXIPIME) IV Stopped (05/09/17 0807)  . vancomycin Stopped (05/08/17 2310)   PRN Meds:.LORazepam, ondansetron **OR** ondansetron (ZOFRAN) IV    ASSESMENT:   *  Decompensated cirrhosis and ETOH hepatitis.  Completed 30 plus days of Prednisolone, had been on taper at time of admission.  MELD 19 as of 9/29. LFTs stable to improved.  Bili 9.6 >> 6.0.       *  Right pleural effusion.  Hepatic hydrothorax.   1.4 liter thoracentesis on 9/29.  Chest tube placed for post tap pneumothorax.  O2 sats improved from low 90s to 100%. PTX improved but not resolved and persistent , sizeable right pleural effusion, left sided infiltrate on today's CXR.    IR placed and is following the chest tube.  Pulmonary not on board.    Diuretics decreased last week due to bump in renal fx, doses increased yesterday to Aldactone/lasix  125/60 but still not at previous doses of 200/60.  Renal  function is slightly improved today. With encephalopathy and elevated MELD, probably not a TIPS candidate for mgt of hepatic hydrothorax.    *  Hepatic encephalopathy:  Somnolence, lethargy improved. Rifaximin added to lactulose on 10/1.  Ammonia 49.     *  Renal insufficiency.  BUN up, creatinine down. 57/1.1 ... 53/0.9.  GFR 53..  >60  *  Protein calorie malnutrition.  Ensure in place.  Consumption avg 25% of meals.    *  HCAP with hypoxia.  On Vanc, Maxipime.  On Solumedrol.      *  Chronic hyponatremia.   *  Chronic thrombocytopenia.     *  Anemia.  On bid po iron at home and now.  Hgb 11.7 >> 9.2 in last 24 hours.    Hx diarrhea. EGD and Colonoscopy in 01/2017: colon diverticulosis, colon biopsies negative for microscopic colitis.  Grade A reflux esophagitis.  Diarrhea attributed to low protein state.     PLAN   *  BMET in AM.    *  Continue Rifaximin and Lactulose.  Leave aldactone/lasix at 125/60 mg.    *  Do we need to consult pulmonary?  Pt will not be able to discharge to SNF, as is plan, until chest tube is out.     Jennye Moccasin  05/09/2017, 12:20 PM Pager: [phone]     Attending physician's note   I have taken an interval history, reviewed the chart and examined the patient. I agree with the Advanced Practitioner's note, impression and recommendations. Continue current dose of diuretics. Long discussion with the patient and her father, who was at bedside, regarding condition, treatment plans. Pulmonary consult.  Claudette Head, MD [facility] [phone] Mon-Fri 8a-5p [phone] after 5p, weekends, holidays

## 2017-05-09 NOTE — Clinical Social Work Note (Signed)
CSW spoke with [place] to see how they would cover SNF. According to the representative, they would typically cover 70% for in-network facilities but the patient has met her deductible ($5,250) and out-of-pocket costs ($6,550) so she would be covered at 100%. CSW called patient's husband to notify and notified patient and her father at bedside.  Dayton Scrape, [place]

## 2017-05-10 ENCOUNTER — Inpatient Hospital Stay (HOSPITAL_COMMUNITY): Payer: 59

## 2017-05-10 DIAGNOSIS — R188 Other ascites: Secondary | ICD-10-CM

## 2017-05-10 LAB — COMPREHENSIVE METABOLIC PANEL
ALT: 82 U/L — ABNORMAL HIGH (ref 14–54)
ANION GAP: 8 (ref 5–15)
AST: 75 U/L — ABNORMAL HIGH (ref 15–41)
Albumin: 1.7 g/dL — ABNORMAL LOW (ref 3.5–5.0)
Alkaline Phosphatase: 101 U/L (ref 38–126)
BILIRUBIN TOTAL: 4.9 mg/dL — AB (ref 0.3–1.2)
BUN: 52 mg/dL — ABNORMAL HIGH (ref 6–20)
CALCIUM: 8.9 mg/dL (ref 8.9–10.3)
CO2: 27 mmol/L (ref 22–32)
Chloride: 93 mmol/L — ABNORMAL LOW (ref 101–111)
Creatinine, Ser: 1.09 mg/dL — ABNORMAL HIGH (ref 0.44–1.00)
GFR calc Af Amer: >60 mL/min (ref >60)
GFR, EST NON AFRICAN AMERICAN: 57 mL/min — AB (ref >60)
Glucose, Bld: 142 mg/dL — ABNORMAL HIGH (ref 65–99)
POTASSIUM: 3.8 mmol/L (ref 3.5–5.1)
Sodium: 128 mmol/L — ABNORMAL LOW (ref 135–145)
TOTAL PROTEIN: 5.5 g/dL — AB (ref 6.5–8.1)

## 2017-05-10 LAB — CBC WITH DIFFERENTIAL/PLATELET
BASOS ABS: 0 10*3/uL (ref 0.0–0.1)
BASOS PCT: 0 %
Eosinophils Absolute: 0.1 10*3/uL (ref 0.0–0.7)
Eosinophils Relative: 1 %
HCT: 23.2 % — ABNORMAL LOW (ref 36.0–46.0)
HEMOGLOBIN: 7.9 g/dL — AB (ref 12.0–15.0)
LYMPHS PCT: 10 %
Lymphs Abs: 1.2 10*3/uL (ref 0.7–4.0)
MCH: 34.5 pg — ABNORMAL HIGH (ref 26.0–34.0)
MCHC: 34.1 g/dL (ref 30.0–36.0)
MCV: 101.3 fL — ABNORMAL HIGH (ref 78.0–100.0)
MONOS PCT: 9 %
Monocytes Absolute: 1.1 10*3/uL — ABNORMAL HIGH (ref 0.1–1.0)
NEUTROS ABS: 9.2 10*3/uL — AB (ref 1.7–7.7)
NEUTROS PCT: 79 %
Platelets: 104 10*3/uL — ABNORMAL LOW (ref 150–400)
RBC: 2.29 MIL/uL — ABNORMAL LOW (ref 3.87–5.11)
RDW: 15.5 % (ref 11.5–15.5)
WBC: 11.6 10*3/uL — ABNORMAL HIGH (ref 4.0–10.5)

## 2017-05-10 LAB — PHOSPHORUS: PHOSPHORUS: 3.7 mg/dL (ref 2.5–4.6)

## 2017-05-10 LAB — MAGNESIUM: Magnesium: 2.1 mg/dL (ref 1.7–2.4)

## 2017-05-10 MED ORDER — LACTULOSE 10 GM/15ML PO SOLN
20.0000 g | Freq: Every day | ORAL | Status: DC
  Administered 2017-05-11 – 2017-05-12 (×2): 20 g via ORAL
  Filled 2017-05-10 (×2): qty 30

## 2017-05-10 NOTE — Progress Notes (Signed)
Referring Physician(s):  Dr. Marland Mcalpine  Supervising Physician: Irish Lack  Patient Status:  [facility] - In-pt  Chief Complaint:  Pneumothorax  Subjective: Patient stable.  Increased output overnight after CT scan. No complaints.   Allergies: Patient has no known allergies.  Medications: Prior to Admission medications   Medication Sig Start Date End Date Taking? Authorizing Provider  escitalopram (LEXAPRO) 20 MG tablet Take 20 mg by mouth daily. 01/18/17  Yes [provider]  feeding supplement, ENSURE ENLIVE, (ENSURE ENLIVE) LIQD Take 237 mLs by mouth 2 (two) times daily between meals. 01/21/17  Yes Ghimire, Werner Lean, MD  ferrous sulfate 325 (65 FE) MG tablet Take 1 tablet (325 mg total) by mouth 2 (two) times daily with a meal. 03/17/17  Yes Zehr, Shanda Bumps D, PA-C  furosemide (LASIX) 40 MG tablet Take 1.5 tablets (60 mg total) by mouth daily. 04/05/17  Yes Iva Boop, MD  Melatonin 10 MG TABS Take 10 mg by mouth at bedtime.   Yes [provider]  Multiple Vitamin (MULTIVITAMIN WITH MINERALS) TABS tablet Take 1 tablet by mouth daily. 03/21/17  Yes Calvert Cantor, MD  pantoprazole (PROTONIX) 20 MG tablet Take 1 tablet (20 mg total) by mouth daily before breakfast. 02/01/17  Yes Iva Boop, MD  potassium chloride SA (K-DUR,KLOR-CON) 20 MEQ tablet Take 1 tablet (20 mEq total) by mouth daily. 02/24/17 05/06/2017 Yes Beola Cord, MD  prednisoLONE (MILLIPRED) 5 MG TABS tablet PER LAST NOTE FROM 04/25/17 YOU SHOULD BE DOING ALREADY,Taper your prednisone as follows:  starting tomorrow for 4 Days then go to  for 4 days, then  for 4 days, then  for 4 days then STOP 04/24/17  Yes Iva Boop, MD  spironolactone (ALDACTONE) 100 MG tablet Take 2 tablets (200 mg total) by mouth daily. Patient taking differently: Take 250 mg by mouth daily.  04/20/17  Yes Iva Boop, MD  thiamine (VITAMIN B-1) 100 MG tablet Take 1 tablet (100 mg total) by mouth daily.  04/20/17  Yes Iva Boop, MD     Vital Signs: BP (!) 90/44   Pulse 94   Temp (!) 97.4 F (36.3 C) (Axillary)   Resp (!) 30   Ht  (1.727 m)   Wt 110 lb 3.7 oz (50 kg)   SpO2 100%   BMI 16.76 kg/m   Physical Exam  NAD, alert Chest: Chest tube in place, large amount of serous output  overnight. Continues to water seal.   Imaging: Dg Chest 1 View  Result Date: 05/06/2017 CLINICAL DATA:  Post right-sided thoracentesis. EXAM: CHEST 1 VIEW COMPARISON:  05/06/2017; 05/05/2017 FINDINGS: Interval development of a small presumably ex vacuo pneumothorax post right-sided thoracentesis. Near complete resolution of right-sided pleural fluid. Grossly unchanged cardiac silhouette and mediastinal contours. The pulmonary vasculature appears indistinct with cephalization of flow. Worsening left basilar/ retrocardiac opacities. No acute osseus abnormalities. IMPRESSION: 1. Interval development of a small presumably ex vacuo pneumothorax post right-sided thoracentesis. 2. Near complete resolution of right-sided pleural effusion post thoracentesis. 3. Similar findings of pulmonary edema with worsening left basilar opacities, atelectasis versus infiltrate Patient was evaluated by the dictating interventional radiologist prior to transfer back to the floor and patient was NOT in respiratory distress and rather reported improvement in her preprocedural shortness of breath. Above findings were discussed with ordering physician, Dr. Marland Mcalpine. Electronically Signed   By: Simonne Come M.D.   On: 05/06/2017 14:34   Dg Chest 2 View  Result Date: 05/10/2017  CLINICAL DATA:  Shortness of breath. Cirrhosis. Ascites. Right chest tube. EXAM: CHEST  2 VIEW COMPARISON:  05/09/2017 FINDINGS: Right pigtail thoracostomy tube remains in place. Moderate right hydropneumothorax for cysts, possibly with somewhat less pleural fluid. Widespread airspace density remains, more extensive in the left lung than the right. No pleural  fluid seen on the left. IMPRESSION: Right thoracostomy tube remains in place. Slightly less right-sided pleural fluid. Persistent right hydropneumothorax. Persistent patchy airspace density bilaterally, more extensive on the left than the right. Electronically Signed   By: Paulina Fusi M.D.   On: 05/10/2017 08:14   Dg Chest 2 View  Result Date: 05/08/2017 CLINICAL DATA:  51 year old female with history of right-sided hemothorax. Evaluate chest tube physician. EXAM: CHEST  2 VIEW COMPARISON:  Chest x-ray a 05/07/2017. FINDINGS: Interval placement of a small bore right-sided chest tube with pigtail reformed in the upper right hemithorax. Previously noted large right-sided hydropneumothorax has decreased slightly in size, currently a occupying approximately 50% of the volume of the right hemithorax. Widespread interstitial and airspace disease again noted throughout both lungs (left greater than right). Heart size is normal. Mediastinal contours remain slightly displaced to the left, suggesting potential tension component. IMPRESSION: 1. Decreased size of what continues to be a large right-sided hydropneumothorax following right-sided chest tube placement. The continues to be some evidence of mild right to left midline shift, suggesting some tension. 2. Persistent asymmetrically distributed multifocal interstitial and airspace disease throughout all aspects of the lungs bilaterally (left greater than right), concerning for multilobar pneumonia. Electronically Signed   By: Trudie Reed M.D.   On: 05/08/2017 07:44   Ct Chest Wo Contrast  Result Date: 05/10/2017 CLINICAL DATA:  Pleural effusion. Alcoholism and hepatic hydrothorax. Anemia. EXAM: CT CHEST WITHOUT CONTRAST TECHNIQUE: Multidetector CT imaging of the chest was performed following the standard protocol without IV contrast. COMPARISON:  Chest x-ray 05/09/2017 FINDINGS: Cardiovascular: Heart is normal in size. Minimal calcified plaque of the thoracic  aorta. Remaining vascular structures are unremarkable. Mediastinum/Nodes: Several small mediastinal lymph nodes likely reactive. No definite hilar adenopathy or axillary adenopathy. Remaining mediastinal structures are unremarkable. Lungs/Pleura: Lungs are adequately inflated and demonstrate a patchy bilateral airspace process left worse than right likely infection. Large right pleural effusion with associated basilar atelectasis. Small pneumothorax over the anterior mid to lower right thorax. Right-sided small caliber pigtail pleural drainage catheter in place. Airways are within normal. Upper Abdomen: Mild nodular contour to the left lobe of the liver. Musculoskeletal: Within normal. IMPRESSION: Bilateral patchy airspace process left worse than right likely infection. Large right pleural effusion with small anterior pneumothorax over the right mid to lower lung. Small caliber right-sided pigtail pleural drainage catheter in place. Mild right basilar atelectasis. Aortic Atherosclerosis (ICD10-I70.0). Electronically Signed   By: Elberta Fortis M.D.   On: 05/10/2017 00:47   Dg Chest Port 1 View  Result Date: 05/09/2017 CLINICAL DATA:  Pleural effusion with chest tube in place EXAM: PORTABLE CHEST 1 VIEW COMPARISON:  May 08, 2017 FINDINGS: Chest tube remains on the right. There is a pneumothorax on the right which appears smaller compared to 1 day prior. There may be a slight tension component given slight shift of heart and mediastinum toward the left. This finding is stable. There is a fairly sizable partially loculated pleural effusion on the right. There is underlying interstitial and patchy alveolar edema bilaterally. No new opacity is evident. Heart is upper normal in size with a degree of pulmonary venous hypertension evident. No evident  adenopathy. No bone lesions. IMPRESSION: Persistent chest tube on the right without change in position. The pneumothorax on the right appears smaller compared to 1 day  prior. There may be mild tension component, stable. There remains sizable partially loculated pleural effusion on the right. There is a degree of underlying edema, likely due to underlying congestive heart failure. Patchy infiltrate on the left potentially could represent superimposed pneumonia. These changes are stable. The cardiac silhouette is stable. Electronically Signed   By: Bretta Bang III M.D.   On: 05/09/2017 07:46   Dg Chest Port 1 View  Result Date: 05/07/2017 CLINICAL DATA:  Right-sided pneumothorax. EXAM: PORTABLE CHEST 1 VIEW COMPARISON:  Chest radiograph - earlier same day; 05/06/2017; 05/04/2017 FINDINGS: Grossly unchanged cardiac silhouette and mediastinal contours with atherosclerotic plaque within the thoracic aorta. There has been continued increase in size of right-sided pneumothorax, now with minimal amount of right to left mediastinal shift. Pulmonary vasculature remains indistinct with cephalization of flow. Unchanged small layering right-sided effusion. No acute osseus abnormalities. IMPRESSION: 1. Continued increase in size of moderate-sized right-sided pneumothorax, now with minimal amount of right to left mediastinal shift. 2. Similar findings suggestive of pulmonary edema and bibasilar opacities, left greater than right, atelectasis versus infiltrate. PLAN: Given suspected continued increase in size of right-sided pneumothorax, the decision was made to proceed with CT-guided chest tube placement. Electronically Signed   By: Simonne Come M.D.   On: 05/07/2017 14:07   Dg Chest Port 1 View  Result Date: 05/07/2017 CLINICAL DATA:  Post right-sided thoracentesis with ex vacuo pneumothorax. EXAM: PORTABLE CHEST 1 VIEW COMPARISON:  05/06/2017; 05/05/2017; 05/04/2017 FINDINGS: Grossly unchanged cardiac silhouette and mediastinal contours with atherosclerotic plaque within the thoracic aorta. Suspected increase in size of now moderate size right-sided pneumothorax with slight  differences potentially accentuated by kyphosis and rotation. Pulmonary vasculature remains indistinct with cephalization of flow. Perihilar opacities are unchanged in favored to represent atelectasis. No new focal airspace opacities. No acute osseus abnormalities. IMPRESSION: 1. Suspected slight increase in now moderate sized right-sided pneumothorax with slight differences potentially accentuated due to patient rotation. Again, this pneumothorax could be an ex vacuo pneumothorax in the setting of recent large volume thoracentesis though clinical correlation is advised. Continued attention on follow-up is recommended. 2. Otherwise, similar findings of pulmonary edema and bibasilar opacities, left greater than right, atelectasis versus infiltrate. Critical Value/emergent results were called by telephone at the time of interpretation on 05/07/2017 at 12:29 pm to Dr. Marguerita Merles , who verbally acknowledged these results. Electronically Signed   By: Simonne Come M.D.   On: 05/07/2017 12:29   Ct Perc Pleural Drain W/indwell Cath W/img Guide  Result Date: 05/07/2017 INDICATION: Enlarging right-sided pneumothorax post thoracentesis. Please perform CT-guided chest tube placement. EXAM: CT PERC PLEURAL DRAIN W/INDWELL CATH W/IMG GUIDE COMPARISON:  Chest radiograph - earlier same day; 05/06/2017; 05/05/2017 MEDICATIONS: The patient is currently admitted to the hospital and receiving intravenous antibiotics. The antibiotics were administered within an appropriate time frame prior to the initiation of the procedure. ANESTHESIA/SEDATION: Moderate (conscious) sedation was employed during this procedure. A total of Versed 0.5 mg and Fentanyl 12.5 mcg was administered intravenously. Moderate Sedation Time: 18 minutes. The patient's level of consciousness and vital signs were monitored continuously by radiology nursing throughout the procedure under my direct supervision. CONTRAST:  None COMPLICATIONS: None immediate. PROCEDURE:  Informed written consent was obtained from the patient after a discussion of the risks, benefits and alternatives to treatment. The patient was placed supine  on the CT gantry and a pre procedural CT was performed re-demonstrating the known right-sided hydropneumothorax. The procedure was planned. A timeout was performed prior to the initiation of the procedure. The skin overlying the ventral aspect the right upper chest was prepped and draped in the usual sterile fashion. The overlying soft tissues were anesthetized with 1% lidocaine with epinephrine. Appropriate trajectory was planned with the use of a 22 gauge spinal needle. An 18 gauge trocar needle was advanced into the right pleural space and a short Amplatz super stiff wire was coiled within the right pleural space. Appropriate positioning was confirmed with a limited CT scan. The tract was serially dilated allowing placement of a 10 [place] all-purpose drainage catheter. Appropriate positioning was confirmed with a limited postprocedural CT scan. Approximately 500 cc of serous fluid was aspirated following percutaneous drainage catheter placement. The tube was connected to a pleural vac device and sutured in place. A dressing was placed. The patient tolerated the procedure well without immediate post procedural complication. IMPRESSION: Successful CT guided placement of a 10 French all purpose drain catheter into the right pleural space for enlarging right-sided hydropneumothorax. Electronically Signed   By: Simonne Come M.D.   On: 05/07/2017 15:42   US Thoracentesis Asp Pleural Space W/img Guide  Result Date: 05/06/2017 INDICATION: History of alcoholic cirrhosis, now with recurrent symptomatic right-sided pleural effusions. Please perform ultrasound-guided thoracentesis for therapeutic purposes. EXAM: US THORACENTESIS ASP PLEURAL SPACE W/IMG GUIDE COMPARISON:  Chest radiograph - earlier same day ; ultrasound-guided thoracentesis - 03/19/2017; 02/15/2017;  01/20/2017 MEDICATIONS: None. COMPLICATIONS: SIR Level A - No therapy, no consequence. Procedure complicated by development of a moderate size ex vacuo pneumothorax. Patient was evaluated from the procedure reported improvement in her preprocedural shortness of breath. TECHNIQUE: Informed written consent was obtained from the patient after a discussion of the risks, benefits and alternatives to treatment. A timeout was performed prior to the initiation of the procedure. Initial ultrasound scanning demonstrates a large anechoic right-sided pleural effusion. The lower chest was prepped and draped in the usual sterile fashion. 1% lidocaine was used for local anesthesia. An ultrasound image was saved for documentation purposes. An 8 Fr Safe-T-Centesis catheter was introduced. The thoracentesis was performed. The catheter was removed and a dressing was applied. The patient tolerated the procedure well without immediate post procedural complication. The patient was escorted to have an upright chest radiograph. FINDINGS: A total of approximately 1.4 liters of serous fluid was removed. IMPRESSION: Successful ultrasound-guided right sided thoracentesis yielding 1.4 liters of pleural fluid. Electronically Signed   By: Simonne Come M.D.   On: 05/06/2017 14:44    Labs:  CBC:  Recent Labs  05/07/17 0924 05/08/17 1023 05/09/17 0218 05/10/17 0225  WBC 14.1* 13.3* 17.0* 11.6*  HGB 11.4* 11.7* 9.2* 7.9*  HCT 33.5* 34.3* 27.8* 23.2*  PLT 84* PLATELETS APPEAR DECREASED 135* 104*    COAGS:  Recent Labs  05-19-17 1434 05/03/17 0209 05/04/17 0811 05/06/17 0608  INR 1.38 1.56 1.68 1.55  APTT  --  35  --   --     BMP:  Recent Labs  05/06/17 0608 05/08/17 1023 05/09/17 0218 05/10/17 0225  NA 126* 129* 124* 128*  K 4.9 5.8* 5.4* 3.8  CL 95* 100* 93* 93*  CO2 18* 18* 23 27  GLUCOSE 127* 143* 114* 142*  BUN 35* 57* 53* 52*  CALCIUM 9.5 9.8 9.6 8.9  CREATININE 0.95 1.15* 0.98 1.09*  GFRNONAA >60 53*  >60 57*  GFRAA >  60 >60 >60 >60    LIVER FUNCTION TESTS:  Recent Labs  05/06/17 0608 05/08/17 1023 05/09/17 0218 05/10/17 0225  BILITOT 7.1* 8.0* 6.0* 4.9*  AST 69* 63* 58* 75*  ALT 75* 74* 74* 82*  ALKPHOS 114 106 109 101  PROT 7.1 6.8 6.1* 5.5*  ALBUMIN 2.0* 2.0* 1.8* 1.7*    Assessment and Plan: Right pneumothorax s/p thoracentesis 9/29, now s/p chest tube placement 9/30 Up in chair during visit. Chest tube remains in place to water seal. >1.3 L output overnight after lying flat for CT scan.   CT Chest showed: Bilateral patchy airspace process left worse than right likely infection. Large right pleural effusion with small anterior pneumothorax over the right mid to lower lung. Small caliber right-sided pigtail pleural drainage catheter in place. Mild right basilar atelectasis.  Will discuss with MD.  IR to follow.   Electronically Signed: Hoyt Koch, PA 05/10/2017, 1:58 PM   I spent a total of 15 Minutes at the the patient's bedside AND on the patient's hospital floor or unit, greater than 50% of which was counseling/coordinating care for pneumothorax.

## 2017-05-10 NOTE — Progress Notes (Signed)
Physical Therapy Treatment Patient Details Name: Rhonda Haley MRN: [id number] DOB: 1965/11/29 Today's Date: 05/10/2017    History of Present Illness 51 y.o. female who presents with sepsis from [facility] in the setting of severe ETOH cirrhosis and recurrent pleural effusion    PT Comments    Pt admitted with above diagnosis. Pt currently with functional limitations due to balance and endurance deficits. Pt was able to ambulate in room with slightly better steady gait than last treatment and incr distance. Needed one sitting rest break.  Pt still needs steadying and will need further therapy.   Pt will benefit from skilled PT to increase their independence and safety with mobility to allow discharge to the venue listed below.     Follow Up Recommendations  Supervision/Assistance - 24 hour;SNF     Equipment Recommendations  None recommended by PT    Recommendations for Other Services OT consult     Precautions / Restrictions Precautions Precautions: Fall Restrictions Weight Bearing Restrictions: No    Mobility  Bed Mobility Overal bed mobility: Needs Assistance Bed Mobility: Supine to Sit     Supine to sit: Min assist     General bed mobility comments: Assist for B LE. VC's for motor planning.   Transfers Overall transfer level: Needs assistance Equipment used: Rolling walker (2 wheeled) Transfers: Sit to/from [employer] Sit to Stand: Mod assist;Min assist;+2 safety/equipment         General transfer comment: Mod assist to power up and steady. Needed RW for support with bil knee instability noted.   Ambulation/Gait Ambulation/Gait assistance: Min assist;+2 safety/equipment Ambulation Distance (Feet): 35 Feet (15 feet, rest break, then 20 feet) Assistive device: Rolling walker (2 wheeled) Gait Pattern/deviations: Step-through pattern;Decreased stride length;Trunk flexed   Gait velocity interpretation: Below normal speed for age/gender General Gait  Details: cues for position in RW and breathing technique. Assist to move RW due to heavy chest tube hanging on it.  Pt got better pushign RW the longer she walked.   Stairs            Wheelchair Mobility    Modified Rankin (Stroke Patients Only)       Balance Overall balance assessment: Needs assistance Sitting-balance support: No upper extremity supported;Feet supported Sitting balance-Leahy Scale: Fair     Standing balance support: During functional activity;Bilateral upper extremity supported Standing balance-Leahy Scale: Poor Standing balance comment: relies on UE support for balance                            Cognition Arousal/Alertness: Awake/alert Behavior During Therapy: WFL for tasks assessed/performed Overall Cognitive Status: Impaired/Different from baseline Area of Impairment: Orientation;Attention;Memory;Following commands;Safety/judgement;Awareness;Problem solving                 Orientation Level: Disoriented to;Time Current Attention Level: Sustained Memory: Decreased short-term memory Following Commands: Follows one step commands consistently;Follows multi-step commands inconsistently Safety/Judgement: Decreased awareness of safety;Decreased awareness of deficits Awareness: Intellectual Problem Solving: Slow processing;Difficulty sequencing        Exercises General Exercises - Lower Extremity Ankle Circles/Pumps: AROM;Both;10 reps;Seated Long Arc Quad: AROM;Both;10 reps;Seated Hip Flexion/Marching: AROM;Both;10 reps;Seated    General Comments General comments (skin integrity, edema, etc.): Pt sat on 6LHFNC on arrival at 100%.   Removed O2 and on RA with therapy with sats 94% and greater.  Replaced O2 at end per nurse request at [facility].  Other VSS.        Pertinent Vitals/Pain Pain  Assessment: No/denies pain    Home Living                      Prior Function            PT Goals (current goals can now be found  in the care plan section) Progress towards PT goals: Progressing toward goals    Frequency    Min 3X/week      PT Plan Current plan remains appropriate    Co-evaluation              AM-PAC PT "6 Clicks" Daily Activity  Outcome Measure  Difficulty turning over in bed (including adjusting bedclothes, sheets and blankets)?: A Lot Difficulty moving from lying on back to sitting on the side of the bed? : A Lot Difficulty sitting down on and standing up from a chair with arms (e.g., wheelchair, bedside commode, etc,.)?: A Lot Help needed moving to and from a bed to chair (including a wheelchair)?: A Lot Help needed walking in hospital room?: A Little Help needed climbing 3-5 steps with a railing? : Total 6 Click Score: 12    End of Session Equipment Utilized During Treatment: Oxygen;Gait belt Activity Tolerance: Patient limited by fatigue Patient left: in chair;with call bell/phone within reach;with chair alarm set;with family/visitor present Nurse Communication: Mobility status PT Visit Diagnosis: Other abnormalities of gait and mobility (R26.89);Difficulty in walking, not elsewhere classified (R26.2);Muscle weakness (generalized) (M62.81)     Time: 1000-1024 PT Time Calculation (min) (ACUTE ONLY): 24 min  Charges:  $Gait Training: 8-22 mins $Therapeutic Exercise: 8-22 mins                    G Codes:       Rhonda Haley,PT Acute Rehabilitation [phone] (pager)    Rhonda Haley 05/10/2017, 1:05 PM

## 2017-05-10 NOTE — Progress Notes (Signed)
Daily Rounding Note  05/10/2017, 10:29 AM  LOS: 7 days   SUBJECTIVE:   Chief complaint:  None.  Had ~ 10 loose stools yesterday, none black or bloody   Overall feels ok.  Eating better, says she ate 80% of this AM breakfast.  Increased input may have to do with her father's encouragement to eat.    OBJECTIVE:         Vital signs in last 24 hours:    Temp:  [97.4 F (36.3 C)-97.5 F (36.4 C)] 97.4 F (36.3 C) (10/03 0348) Pulse Rate:  [87-106] 87 (10/03 0400) Resp:  [19-38] 19 (10/03 0400) BP: (82-91)/(44-50) 90/44 (10/03 0400) SpO2:  [99 %-100 %] 100 % (10/03 0400) Weight:  [50 kg (110 lb 3.7 oz)] 50 kg (110 lb 3.7 oz) (10/03 0348) Last BM Date: 05/09/17 Filed Weights   05/08/17 0500 05/09/17 0500 05/10/17 0348  Weight: 48.5 kg (106 lb 14.8 oz) 49.2 kg (108 lb 7.5 oz) 50 kg (110 lb 3.7 oz)   General: jaundiced, icteric.  Frail.  Comfortable and alert   Heart: RRR Chest: diminished BS on right.  Chest tube still in place Abdomen: soft, NT, ND.  Active BS  Extremities: no CCE Neuro/Psych:  Alert, appropriate.  Oriented x 3.  Less UE/hand tremor. No asterixis.    Intake/Output from previous day: 10/02 0701 - 10/03 0700 In: 810 [P.O.:360; IV Piggyback:450] Out: 1326 [Stool:1; Chest Tube:1325]  Intake/Output this shift: No intake/output data recorded.  Lab Results:  Recent Labs  05/08/17 1023 05/09/17 0218 05/10/17 0225  WBC 13.3* 17.0* 11.6*  HGB 11.7* 9.2* 7.9*  HCT 34.3* 27.8* 23.2*  PLT PLATELETS APPEAR DECREASED 135* 104*   BMET  Recent Labs  05/08/17 1023 05/09/17 0218 05/10/17 0225  NA 129* 124* 128*  K 5.8* 5.4* 3.8  CL 100* 93* 93*  CO2 18* 23 27  GLUCOSE 143* 114* 142*  BUN 57* 53* 52*  CREATININE 1.15* 0.98 1.09*  CALCIUM 9.8 9.6 8.9   LFT  Recent Labs  05/08/17 1023 05/09/17 0218 05/10/17 0225  PROT 6.8 6.1* 5.5*  ALBUMIN 2.0* 1.8* 1.7*  AST 63* 58* 75*  ALT 74* 74* 82*    ALKPHOS 106 109 101  BILITOT 8.0* 6.0* 4.9*    Studies/Results: Dg Chest 2 View  Result Date: 05/10/2017 CLINICAL DATA:  Shortness of breath. Cirrhosis. Ascites. Right chest tube. EXAM: CHEST  2 VIEW COMPARISON:  05/09/2017 FINDINGS: Right pigtail thoracostomy tube remains in place. Moderate right hydropneumothorax for cysts, possibly with somewhat less pleural fluid. Widespread airspace density remains, more extensive in the left lung than the right. No pleural fluid seen on the left. IMPRESSION: Right thoracostomy tube remains in place. Slightly less right-sided pleural fluid. Persistent right hydropneumothorax. Persistent patchy airspace density bilaterally, more extensive on the left than the right. Electronically Signed   By: Paulina Fusi M.D.   On: 05/10/2017 08:14   Ct Chest Wo Contrast  Result Date: 05/10/2017 CLINICAL DATA:  Pleural effusion. Alcoholism and hepatic hydrothorax. Anemia. EXAM: CT CHEST WITHOUT CONTRAST TECHNIQUE: Multidetector CT imaging of the chest was performed following the standard protocol without IV contrast. COMPARISON:  Chest x-ray 05/09/2017 FINDINGS: Cardiovascular: Heart is normal in size. Minimal calcified plaque of the thoracic aorta. Remaining vascular structures are unremarkable. Mediastinum/Nodes: Several small mediastinal lymph nodes likely reactive. No definite hilar adenopathy or axillary adenopathy. Remaining mediastinal structures are unremarkable. Lungs/Pleura: Lungs are adequately inflated and demonstrate a  patchy bilateral airspace process left worse than right likely infection. Large right pleural effusion with associated basilar atelectasis. Small pneumothorax over the anterior mid to lower right thorax. Right-sided small caliber pigtail pleural drainage catheter in place. Airways are within normal. Upper Abdomen: Mild nodular contour to the left lobe of the liver. Musculoskeletal: Within normal. IMPRESSION: Bilateral patchy airspace process left worse  than right likely infection. Large right pleural effusion with small anterior pneumothorax over the right mid to lower lung. Small caliber right-sided pigtail pleural drainage catheter in place. Mild right basilar atelectasis. Aortic Atherosclerosis (ICD10-I70.0). Electronically Signed   By: Elberta Fortis M.D.   On: 05/10/2017 00:47   Dg Chest Port 1 View  Result Date: 05/09/2017 CLINICAL DATA:  Pleural effusion with chest tube in place EXAM: PORTABLE CHEST 1 VIEW COMPARISON:  May 08, 2017 FINDINGS: Chest tube remains on the right. There is a pneumothorax on the right which appears smaller compared to 1 day prior. There may be a slight tension component given slight shift of heart and mediastinum toward the left. This finding is stable. There is a fairly sizable partially loculated pleural effusion on the right. There is underlying interstitial and patchy alveolar edema bilaterally. No new opacity is evident. Heart is upper normal in size with a degree of pulmonary venous hypertension evident. No evident adenopathy. No bone lesions. IMPRESSION: Persistent chest tube on the right without change in position. The pneumothorax on the right appears smaller compared to 1 day prior. There may be mild tension component, stable. There remains sizable partially loculated pleural effusion on the right. There is a degree of underlying edema, likely due to underlying congestive heart failure. Patchy infiltrate on the left potentially could represent superimposed pneumonia. These changes are stable. The cardiac silhouette is stable. Electronically Signed   By: Bretta Bang III M.D.   On: 05/09/2017 07:46   Scheduled Meds: . escitalopram  20 mg Oral Daily  . feeding supplement (ENSURE ENLIVE)  237 mL Oral TID BM  . ferrous sulfate  325 mg Oral BID WC  . folic acid  1 mg Oral Daily  . furosemide  60 mg Oral Daily  . lactulose  20 g Oral BID  . mouth rinse  15 mL Mouth Rinse BID  . Melatonin  6 mg Oral QHS  .  multivitamin with minerals  1 tablet Oral Daily  . pantoprazole  20 mg Oral QAC breakfast  . rifaximin  550 mg Oral BID  . sodium chloride flush  3 mL Intravenous Q12H  . spironolactone  125 mg Oral Daily  . thiamine injection  100 mg Intravenous Daily   Or  . thiamine  100 mg Oral Daily   Continuous Infusions: . ceFEPime (MAXIPIME) IV 1 g (05/10/17 0950)  . vancomycin Stopped (05/10/17 0030)   PRN Meds:.LORazepam, ondansetron **OR** ondansetron (ZOFRAN) IV  ASSESMENT:   * Decompensated cirrhosis and ETOH hepatitis. Completed 30 plus days of Prednisolone, had been on taper at time of admission. MELD 19 as of 9/29. LFTs stable to improved.  Bili 9.6 >> 4.9.     * Transudative, right pleural effusion. Hepatic hydrothorax. Bil HCAP.   1.4 liter thoracentesis on 9/29. Chest tube placed for post tap pneumothorax.   Pulmonary involved as of 10/2.   CT chest yesterday, as above.    On Vanc, Maxipime.  Aldactone/lasix at 125/60 mg but still not at previous doses of200/60.  Renal function is slightly worse today. With encephalopathy and elevated MELD, probably  not a TIPS candidate for mgt of hepatic hydrothorax.    * Hepatic encephalopathy: Somnolence, lethargy improved. Rifaximin added to lactulose on 10/1.  Ammonia 49.    * Renal insufficiency. Cr up to 1.09 today  * Protein calorie malnutrition. Ensure in place. Consumption avg 25% - 50% of meals.   * Chronic hyponatremia.   * Chronic thrombocytopenia.    *  Anemia.  On bid po iron.  Hgb 11.7 >> 9.2 >> 7.9 in last 48 hours.  No overt GI or other bleeding.   EGD and Colonoscopy in 01/2017 for diarrhea and variceal screening: colon diverticulosis, colon biopsies negative for microscopic colitis.  Grade A reflux esophagitis.  Diarrhea attributed to low protein state.  On low dose daily Protonix.     PLAN   *  Leave Aldactone, Lasix at 125/60 mgs.  Follow BMET, CBC.  FOBT test.   *  Drop Lactulose from  20 gm bid to 20 gm daily.  Continue Rifaximin.    *  Has first appt with [facility] set for 10/8.  May need to postpone/reschedule if she is still inpt.  For now leaving appt in place.    Rhonda Haley  05/10/2017, 10:29 AM Pager: [phone]     Attending physician's note   I have taken an interval history, reviewed the chart and examined the patient. I agree with the Advanced Practitioner's note, impression and recommendations. T bili trending down to 4.9 today. MELD=25. Renal insufficiency noted. Hyponatremia persists. Worsening anemia without overt GI bleeding noted. Ten loose stools yesterday.  Trend CMP, CBC. Recheck PT/INR.  Continue diuretics at current schedule and dosage however will reduce if renal insufficiency worsens.  Reduce lactulose to 20 g daily to aim for 3-4 loose stools/day. Check stool for C diff if diarrhea persists. Not a good TIPS candidate with HE and today's MELD=25.  Claudette Head, MD [facility] [phone] Mon-Fri 8a-5p [phone] after 5p, weekends, holidays

## 2017-05-10 NOTE — Progress Notes (Addendum)
PROGRESS NOTE    Rhonda Haley  NWG:[id number] DOB: 1965/10/04 DOA: 05/03/2017 PCP: Lewis Moccasin, MD   Chief Complaint  Patient presents with  . Fever  . Shortness of Breath    Brief Narrative:  HPI On 04/10/2017 by Ms. Junious Silk, NP Avacyn Kloosterman is a 51 y.o. female with medical history significant for alcoholic cirrhosis with associated ascites, recurrent right hepatic hydrothorax, chronic hyponatremia, protein calorie malnutrition and anemia. Patient had been started on low-dose prednisone (03/21/17) for acute alcoholic hepatitis in the setting of weakly positive autoimmune markers. Over the past 2 weeks prednisone has been tapered to off with last dosage on 9/24. Patient has had asymptomatic leukocytosis dating back to 8/21 where her white count was documented at 23,300 with repeat CBC on 9/12 white count 29,900. This morning patient awakened with generalized malaise, weakness, chills and subjective fevers. She is not having any abdominal pain, nausea vomiting or diarrhea. She is not had any coughing or sick contacts. She followed up with the GI office she was noted to have a low-grade temperature of 100.4, heart rate was 140, BP was 90/56. Because of the symptoms she was sent to the ER for further evaluation. In the ER she continued with low-grade temperature and tachycardia as well as tachypnea with a low but normal blood pressure readings. Her white count was 20,700 with left shift as previous. Her lactic acid was 2.15. Her sodium was 122. Abdominal exam was benign. Chest x-ray revealed significant right pleural effusion and right basilar opacity. EDP is concerned over possible HCAP and requests evaluation for observation admission. She is not hypoxic at rest. She does report dyspnea on exertion which is chronic.  Interim history Admitted for sepsis secondary to HCAP. She subsequently underwent emergent Thoracentesis and was transferred to SDU. She is improved and GI was consulted for  further evaluation and Recc's. PCCM following for her Respiratory Failure. Patient was started back on Diuretics last night. Felt a little SOB this AM and repeat CXR did not show any worsening of Pleural Effusion. Started Patient on IV Steroids with Solumedrol 40 mg q12h on 05/04/17 and patient improving. Repeat Thoracentesis done by Dr. Grace Isaac and patient had an ex-vacuo pneumothorax after but remained stable.   Overnight the patient appeared to be withdrawing from EtOH so she was placed on CIWA protocol and received Ativan early this AM. The Pneumothorax appeared to get larger today on repeat X-Ray so Dr. Grace Isaac of IR placed a CT Guided 10 French Drainage catheter into the Right Pleural Space yielding 550 cc of additional serous pleural fluid.   PT evaluated and feel as if she needs SNF. Gastroenterology adding Rifaximin for Encephalopathy and Current dose of Lactulose increased. GI also increased Aldactone to 125 mg Daily ad Lasix to 60 mg po Daily.  Assessment & Plan   Sepsis secondary to presumed healthcare associated pneumonia -Patient presented with fevers, chills, tachycardia, tachypnea and relative hypotension with leukocytosis -Patient has had persistent leukocytosis however likely also affected by steroids -Lactic acid mildly elevated -Chest x-ray showed large right pleural effusion, diffuse alveolar opacities in left lung worrisome for pneumonia or pulmonary edema -Blood cultures show no growth to date -Urine culture showed multiple species -Status post thoracentesis -Fluid culture showed no growth to date -Urine Legionella and strep pneumonia antigens negative -Respiratory viral panel influenza PCR unremarkable -Continue cefepime and vancomycin  Acute hypoxic respiratory failure secondary to pneumonia, pleural effusion, hepatic hydrothorax -Continue supplemental oxygen to maintain saturations above 90% -PCCM consulted  and appreciated -Continue treatment of underlying  conditions  Right Pleural effusion secondary to hepatic hydrothorax -Status post right thoracentesis on 03/19/2017, yielding 2.6 L of clear yellow fluid -Status post thoracentesis on 05/03/2017, yielding 1.2 L of fluid removed -IV fluid discontinued, patient was given 1 dose of IV Lasix -Patient had reported chronic dyspnea on exertion -PCCM consulted and appreciated -Pleural effusion noted to be transudate in -Status post right thoracentesis 05/06/2017 by interventional radiology, Dr. Grace Isaac, 1.4 L removed -Postthoracentesis chest x-ray showed interval development of small presumably ex vacuo pneumothorax -Repeat chest x-ray 05/07/2017 showed continued increase in size of moderate sized right-sided pneumothorax, minimal amount of right to left mediastinal shift. -CT chest tube placement by IR, yielding 550 mL of serous fluid -Chest x-ray 05/08/2017 showed decreased size of a hydropneumothorax, evidence of mild right to left midline shift suggesting some tension -Chest x-ray today, shows right thoracostomy tubes, slightly less right-sided pleural effusion. Persistent right hydropneumothorax. Persistent patchy airspace density bilaterally, more extensive on the left than the right.  Decompensated alcoholic services of liver with ascites -Gastroenterology consulted and appreciated -Patient has been on Lasix and spironolactone dose adjustments-Aldactone 25 mg, Lasix 60 mg -Mildly elevated LFTs Patient recently completed prednisone taper on 05/01/2017, per gastroenterology will not need further taper -Patient with minimal ascites and no abdominal pain-likely not SBP, paracentesis was canceled -Gastroenterology recommended continuing antibiotic therapy with protein supplements -Continue lactulose, rifaximin  Alcohol abuse/withdrawal -Continue multivitamin, folic acid, thiamine, CIWA protocol   Chronic hyponatremia -Baseline sodium appears to be 124-125 -Diuretics adjusted as above -Sodium  currently 128 -Continue to monitor CMP  Murmur -Patient with loud systolic murmur -Echocardiogram July 2018 shows normal systolic function, normal diastolic function without valvular maladies -As above, blood cultures show no growth to date -Patient will need follow-up with cardiology as an outpatient -Echocardiogram EF 70-75%, grade 1 diastolic dysfunction. Aortic valve seems thickened, mild outflow obstruction, no AI.  Anemia of chronic disease/IDA -Hemoglobin currently 7.9, baseline hemoglobin 9 -Continue iron supplementation -Monitor CBC  Severe Protein calorie malnutrition -Nutrition consult and appreciated, continue supplements  Thrombocytopenia, chronic  -Likely secondary to alcohol use -Platelets currently 104 -Continue to monitor CBC  Hyperbilirubinemia -Likely secondary to alcoholism and decompensated liver cirrhosis with ascites -Continue to monitor CMP  Acute kidney injury -Possibly secondary to hepatorenal syndrome or worsening of creatinine secondary to diuretics -Creatinine currently 1.09 -Continue to monitor CMP  Hyperkalemia -Resolved -Patient was given Kayexalate, potassium currently 3.8 -Continue to monitor CMP  Deconditioning -PT, OT recommended SNF  DVT Prophylaxis  SCDs  Code Status: Full  Family Communication: Father at bedside  Disposition Plan: Admitted, continue to monitor and stepdown.  Will likely need SNF at discharge  Consultants PCCM Gastroenterology Interventional radiology  Procedures  Thoracentesis 05/03/2017, 1.2 L of fluid removed Echocardiogram Ultrasound-guided right thoracentesis yielding 1.4 L fluid removed on 05/06/2017 CT-guided cyst chest tube placed 05/07/2017 for pneumothorax  Antibiotics   Anti-infectives    Start     Dose/Rate Route Frequency Ordered Stop   05/09/17 0800  ceFEPIme (MAXIPIME) 1 g in dextrose 5 % 50 mL IVPB     1 g 100 mL/hr over 30 Minutes Intravenous Every 12 hours 05/08/17 1543 05/11/17  2359   05/08/17 1500  rifaximin (XIFAXAN) tablet 550 mg     550 mg Oral 2 times daily 05/08/17 1417     05/06/17 2200  vancomycin (VANCOCIN) IVPB 1000 mg/200 mL premix     1,000 mg 200 mL/hr over 60 Minutes Intravenous Every  24 hours 05/06/17 1224 05/11/17 2359   04/21/2017 2200  ceFEPIme (MAXIPIME) 1 g in dextrose 5 % 50 mL IVPB  Status:  Discontinued     1 g 100 mL/hr over 30 Minutes Intravenous Every 8 hours 04/23/2017 2055 05/08/17 1543   05/05/2017 2130  vancomycin (VANCOCIN) IVPB 750 mg/150 ml premix  Status:  Discontinued     750 mg 150 mL/hr over 60 Minutes Intravenous Every 12 hours 04/11/2017 2102 05/06/17 1213   05/01/2017 1615  vancomycin (VANCOCIN) IVPB 1000 mg/200 mL premix     1,000 mg 200 mL/hr over 60 Minutes Intravenous  Once 05/06/2017 1614 04/23/2017 1736   04/17/2017 1615  piperacillin-tazobactam (ZOSYN) IVPB 3.375 g     3.375 g 100 mL/hr over 30 Minutes Intravenous  Once 04/10/2017 1614 04/24/2017 1758      Subjective:   Vanshika Jastrzebski seen and examined today.  Feels breathing has improved since admission. Denies current chest pain, abdominal pain, nausea or vomiting, diarrhea or constipation, dizziness or headache.  Objective:   Vitals:   05/09/17 2325 05/10/17 0000 05/10/17 0348 05/10/17 0400  BP:  (!) 82/50  (!) 90/44  Pulse:  99  87  Resp:  (!) 27  19  Temp: (!) 97.5 F (36.4 C)  (!) 97.4 F (36.3 C)   TempSrc: Axillary  Axillary   SpO2:  100%  100%  Weight:   50 kg (110 lb 3.7 oz)   Height:        Intake/Output Summary (Last 24 hours) at 05/10/17 1231 Last data filed at 05/10/17 7253  Gross per 24 hour  Intake              690 ml  Output             1291 ml  Net             -601 ml   Filed Weights   05/08/17 0500 05/09/17 0500 05/10/17 0348  Weight: 48.5 kg (106 lb 14.8 oz) 49.2 kg (108 lb 7.5 oz) 50 kg (110 lb 3.7 oz)    Exam  General: Well developed, well nourished, NAD, appears stated age  HEENT: NCAT, Scleral icterus, mucous membranes moist.   Neck:  Supple, no JVD, no masses  Cardiovascular: S1 S2 auscultated, 3/6 SEM, RRR  Respiratory: Diminished breath sounds, right chest tube in place  Abdomen: Soft, nontender, nondistended, + bowel sounds  Extremities: warm dry without cyanosis clubbing or edema  Neuro: AAOx3, nonfocal  Psych: appropriate mood and affect   Data Reviewed: I have personally reviewed following labs and imaging studies  CBC:  Recent Labs Lab 05/06/17 0608 05/07/17 0924 05/08/17 1023 05/09/17 0218 05/10/17 0225  WBC 20.7* 14.1* 13.3* 17.0* 11.6*  NEUTROABS 18.3* 11.7* 10.5* 13.8* 9.2*  HGB 10.2* 11.4* 11.7* 9.2* 7.9*  HCT 29.2* 33.5* 34.3* 27.8* 23.2*  MCV 98.6 101.2* 101.5* 100.0 101.3*  PLT 112* 84* PLATELETS APPEAR DECREASED 135* 104*   Basic Metabolic Panel:  Recent Labs Lab 05/05/17 0250 05/06/17 0608 05/08/17 1023 05/09/17 0218 05/10/17 0225  NA 121* 126* 129* 124* 128*  K 4.7 4.9 5.8* 5.4* 3.8  CL 92* 95* 100* 93* 93*  CO2 23 18* 18* 23 27  GLUCOSE 215* 127* 143* 114* 142*  BUN 25* 35* 57* 53* 52*  CREATININE 1.01* 0.95 1.15* 0.98 1.09*  CALCIUM 8.6* 9.5 9.8 9.6 8.9  MG 1.9 2.2 2.6* 2.4 2.1  PHOS 3.9 3.8 6.0* 2.9 3.7   GFR: Estimated Creatinine Clearance:  47.1 mL/min (A) (by C-G formula based on SCr of 1.09 mg/dL (H)). Liver Function Tests:  Recent Labs Lab 05/05/17 0250 05/06/17 0608 05/08/17 1023 05/09/17 0218 05/10/17 0225  AST 62* 69* 63* 58* 75*  ALT 67* 75* 74* 74* 82*  ALKPHOS 102 114 106 109 101  BILITOT 8.0* 7.1* 8.0* 6.0* 4.9*  PROT 6.3* 7.1 6.8 6.1* 5.5*  ALBUMIN 1.8* 2.0* 2.0* 1.8* 1.7*   No results for input(s): LIPASE, AMYLASE in the last 168 hours.  Recent Labs Lab 05/04/17 0811 05/09/17 0218  AMMONIA 43* 49*   Coagulation Profile:  Recent Labs Lab 05/04/17 0811 05/06/17 0608  INR 1.68 1.55   Cardiac Enzymes: No results for input(s): CKTOTAL, CKMB, CKMBINDEX, TROPONINI in the last 168 hours. BNP (last 3 results) No results for  input(s): PROBNP in the last 8760 hours. HbA1C: No results for input(s): HGBA1C in the last 72 hours. CBG: No results for input(s): GLUCAP in the last 168 hours. Lipid Profile: No results for input(s): CHOL, HDL, LDLCALC, TRIG, CHOLHDL, LDLDIRECT in the last 72 hours. Thyroid Function Tests: No results for input(s): TSH, T4TOTAL, FREET4, T3FREE, THYROIDAB in the last 72 hours. Anemia Panel: No results for input(s): VITAMINB12, FOLATE, FERRITIN, TIBC, IRON, RETICCTPCT in the last 72 hours. Urine analysis:    Component Value Date/Time   COLORURINE AMBER (A) 04/13/2017 1455   APPEARANCEUR HAZY (A) 04/14/2017 1455   LABSPEC 1.015 04/28/2017 1455   PHURINE 6.0 05/04/2017 1455   GLUCOSEU NEGATIVE 04/25/2017 1455   HGBUR SMALL (A) 05/04/2017 1455   BILIRUBINUR NEGATIVE 04/23/2017 1455   KETONESUR NEGATIVE 04/23/2017 1455   PROTEINUR NEGATIVE 04/29/2017 1455   NITRITE NEGATIVE 04/14/2017 1455   LEUKOCYTESUR TRACE (A) 04/25/2017 1455   Sepsis Labs: (procalcitonin:4,lacticidven:4)  ) Recent Results (from the past 240 hour(s))  Blood culture (routine x 2)     Status: None   Collection Time: 05/01/2017  2:20 PM  Result Value Ref Range Status   Specimen Description BLOOD LEFT ANTECUBITAL  Final   Special Requests   Final    BOTTLES DRAWN AEROBIC AND ANAEROBIC Blood Culture adequate volume   Culture NO GROWTH 5 DAYS  Final   Report Status 05/07/2017 FINAL  Final  Blood culture (routine x 2)     Status: None   Collection Time: 04/30/2017  2:25 PM  Result Value Ref Range Status   Specimen Description BLOOD LEFT HAND  Final   Special Requests IN PEDIATRIC BOTTLE Blood Culture adequate volume  Final   Culture NO GROWTH 5 DAYS  Final   Report Status 05/07/2017 FINAL  Final  Urine culture     Status: Abnormal   Collection Time: 04/25/2017  2:55 PM  Result Value Ref Range Status   Specimen Description URINE, CLEAN CATCH  Final   Special Requests NONE  Final   Culture MULTIPLE  SPECIES PRESENT, SUGGEST RECOLLECTION (A)  Final   Report Status 05/04/2017 FINAL  Final  Respiratory Panel by PCR     Status: None   Collection Time: 04/24/2017 11:29 PM  Result Value Ref Range Status   Adenovirus NOT DETECTED NOT DETECTED Final   Coronavirus 229E NOT DETECTED NOT DETECTED Final   Coronavirus HKU1 NOT DETECTED NOT DETECTED Final   Coronavirus NL63 NOT DETECTED NOT DETECTED Final   Coronavirus OC43 NOT DETECTED NOT DETECTED Final   Metapneumovirus NOT DETECTED NOT DETECTED Final   Rhinovirus / Enterovirus NOT DETECTED NOT DETECTED Final   Influenza A NOT DETECTED NOT DETECTED Final  Influenza B NOT DETECTED NOT DETECTED Final   Parainfluenza Virus 1 NOT DETECTED NOT DETECTED Final   Parainfluenza Virus 2 NOT DETECTED NOT DETECTED Final   Parainfluenza Virus 3 NOT DETECTED NOT DETECTED Final   Parainfluenza Virus 4 NOT DETECTED NOT DETECTED Final   Respiratory Syncytial Virus NOT DETECTED NOT DETECTED Final   Bordetella pertussis NOT DETECTED NOT DETECTED Final   Chlamydophila pneumoniae NOT DETECTED NOT DETECTED Final   Mycoplasma pneumoniae NOT DETECTED NOT DETECTED Final  Body fluid culture     Status: None   Collection Time: 05/03/17  2:10 AM  Result Value Ref Range Status   Specimen Description PLEURAL RIGHT  Final   Special Requests NONE  Final   Gram Stain NO WBC SEEN NO ORGANISMS SEEN   Final   Culture NO GROWTH 3 DAYS  Final   Report Status 05/06/2017 FINAL  Final  MRSA PCR Screening     Status: None   Collection Time: 05/09/17 11:57 AM  Result Value Ref Range Status   MRSA by PCR NEGATIVE NEGATIVE Final    Comment:        The GeneXpert MRSA Assay (FDA approved for NASAL specimens only), is one component of a comprehensive MRSA colonization surveillance program. It is not intended to diagnose MRSA infection nor to guide or monitor treatment for MRSA infections.       Radiology Studies: Dg Chest 2 View  Result Date: 05/10/2017 CLINICAL DATA:   Shortness of breath. Cirrhosis. Ascites. Right chest tube. EXAM: CHEST  2 VIEW COMPARISON:  05/09/2017 FINDINGS: Right pigtail thoracostomy tube remains in place. Moderate right hydropneumothorax for cysts, possibly with somewhat less pleural fluid. Widespread airspace density remains, more extensive in the left lung than the right. No pleural fluid seen on the left. IMPRESSION: Right thoracostomy tube remains in place. Slightly less right-sided pleural fluid. Persistent right hydropneumothorax. Persistent patchy airspace density bilaterally, more extensive on the left than the right. Electronically Signed   By: Paulina Fusi M.D.   On: 05/10/2017 08:14   Ct Chest Wo Contrast  Result Date: 05/10/2017 CLINICAL DATA:  Pleural effusion. Alcoholism and hepatic hydrothorax. Anemia. EXAM: CT CHEST WITHOUT CONTRAST TECHNIQUE: Multidetector CT imaging of the chest was performed following the standard protocol without IV contrast. COMPARISON:  Chest x-ray 05/09/2017 FINDINGS: Cardiovascular: Heart is normal in size. Minimal calcified plaque of the thoracic aorta. Remaining vascular structures are unremarkable. Mediastinum/Nodes: Several small mediastinal lymph nodes likely reactive. No definite hilar adenopathy or axillary adenopathy. Remaining mediastinal structures are unremarkable. Lungs/Pleura: Lungs are adequately inflated and demonstrate a patchy bilateral airspace process left worse than right likely infection. Large right pleural effusion with associated basilar atelectasis. Small pneumothorax over the anterior mid to lower right thorax. Right-sided small caliber pigtail pleural drainage catheter in place. Airways are within normal. Upper Abdomen: Mild nodular contour to the left lobe of the liver. Musculoskeletal: Within normal. IMPRESSION: Bilateral patchy airspace process left worse than right likely infection. Large right pleural effusion with small anterior pneumothorax over the right mid to lower lung.  Small caliber right-sided pigtail pleural drainage catheter in place. Mild right basilar atelectasis. Aortic Atherosclerosis (ICD10-I70.0). Electronically Signed   By: Elberta Fortis M.D.   On: 05/10/2017 00:47   Dg Chest Port 1 View  Result Date: 05/09/2017 CLINICAL DATA:  Pleural effusion with chest tube in place EXAM: PORTABLE CHEST 1 VIEW COMPARISON:  May 08, 2017 FINDINGS: Chest tube remains on the right. There is a pneumothorax on the right  which appears smaller compared to 1 day prior. There may be a slight tension component given slight shift of heart and mediastinum toward the left. This finding is stable. There is a fairly sizable partially loculated pleural effusion on the right. There is underlying interstitial and patchy alveolar edema bilaterally. No new opacity is evident. Heart is upper normal in size with a degree of pulmonary venous hypertension evident. No evident adenopathy. No bone lesions. IMPRESSION: Persistent chest tube on the right without change in position. The pneumothorax on the right appears smaller compared to 1 day prior. There may be mild tension component, stable. There remains sizable partially loculated pleural effusion on the right. There is a degree of underlying edema, likely due to underlying congestive heart failure. Patchy infiltrate on the left potentially could represent superimposed pneumonia. These changes are stable. The cardiac silhouette is stable. Electronically Signed   By: Bretta Bang III M.D.   On: 05/09/2017 07:46     Scheduled Meds: . escitalopram  20 mg Oral Daily  . feeding supplement (ENSURE ENLIVE)  237 mL Oral TID BM  . ferrous sulfate  325 mg Oral BID WC  . folic acid  1 mg Oral Daily  . furosemide  60 mg Oral Daily  . [START ON 05/11/2017] lactulose  20 g Oral Daily  . mouth rinse  15 mL Mouth Rinse BID  . Melatonin  6 mg Oral QHS  . multivitamin with minerals  1 tablet Oral Daily  . pantoprazole  20 mg Oral QAC breakfast  .  rifaximin  550 mg Oral BID  . sodium chloride flush  3 mL Intravenous Q12H  . spironolactone  125 mg Oral Daily  . thiamine injection  100 mg Intravenous Daily   Or  . thiamine  100 mg Oral Daily   Continuous Infusions: . ceFEPime (MAXIPIME) IV 1 g (05/10/17 0950)  . vancomycin Stopped (05/10/17 0030)     LOS: 7 days   Time Spent in minutes   30 minutes  Kare Dado D.O. on 05/10/2017 at 12:31 PM  Between 7am to 7pm - Pager - [phone]  After 7pm go to [email or web address] - password [email or web address]  And look for the night coverage person covering for me after hours  [facility] Office  [phone]

## 2017-05-11 LAB — CBC
HEMATOCRIT: 26.6 % — AB (ref 36.0–46.0)
HEMOGLOBIN: 8.8 g/dL — AB (ref 12.0–15.0)
MCH: 33.3 pg (ref 26.0–34.0)
MCHC: 33.1 g/dL (ref 30.0–36.0)
MCV: 100.8 fL — AB (ref 78.0–100.0)
Platelets: 122 10*3/uL — ABNORMAL LOW (ref 150–400)
RBC: 2.64 MIL/uL — AB (ref 3.87–5.11)
RDW: 14.9 % (ref 11.5–15.5)
WBC: 14.8 10*3/uL — AB (ref 4.0–10.5)

## 2017-05-11 LAB — BASIC METABOLIC PANEL
ANION GAP: 8 (ref 5–15)
BUN: 56 mg/dL — ABNORMAL HIGH (ref 6–20)
CHLORIDE: 89 mmol/L — AB (ref 101–111)
CO2: 26 mmol/L (ref 22–32)
Calcium: 8.5 mg/dL — ABNORMAL LOW (ref 8.9–10.3)
Creatinine, Ser: 1.25 mg/dL — ABNORMAL HIGH (ref 0.44–1.00)
GFR calc non Af Amer: 48 mL/min — ABNORMAL LOW (ref >60)
GFR, EST AFRICAN AMERICAN: 56 mL/min — AB (ref >60)
Glucose, Bld: 103 mg/dL — ABNORMAL HIGH (ref 65–99)
POTASSIUM: 3.9 mmol/L (ref 3.5–5.1)
Sodium: 123 mmol/L — ABNORMAL LOW (ref 135–145)

## 2017-05-11 LAB — OCCULT BLOOD X 1 CARD TO LAB, STOOL: Fecal Occult Bld: NEGATIVE

## 2017-05-11 MED ORDER — CEFEPIME HCL 1 G IJ SOLR
1.0000 g | Freq: Two times a day (BID) | INTRAMUSCULAR | Status: DC
  Administered 2017-05-11: 1 g via INTRAVENOUS
  Filled 2017-05-11 (×2): qty 1

## 2017-05-11 NOTE — Progress Notes (Signed)
Pharmacy Antibiotic Note Rhonda Haley is a 51 y.o. female admitted on 04/27/2017 with pneumonia and pleural effussion. Currently on day 10 of Cefepime and vancomycin for treatment.   D/w Dr. Catha Gosselin with MRSA PCR negative will stop vancomycin today but continue Cefepime.   Plan: Continue Cefepime 1 gram IV every 12 hours    Height:  (172.7 cm) Weight: 115 lb 11.9 oz (52.5 kg) IBW/kg (Calculated) : 63.9  Temp (24hrs), Avg:98.2 F (36.8 C), Min:97.6 F (36.4 C), Max:98.5 F (36.9 C)   Recent Labs Lab 05/06/17 0608 05/06/17 0953 05/07/17 0924 05/08/17 1023 05/09/17 0218 05/10/17 0225 05/11/17 0241  WBC 20.7*  --  14.1* 13.3* 17.0* 11.6* 14.8*  CREATININE 0.95  --   --  1.15* 0.98 1.09* 1.25*  VANCOTROUGH  --  27*  --   --   --   --   --     Estimated Creatinine Clearance: 43.1 mL/min (A) (by C-G formula based on SCr of 1.25 mg/dL (H)).    No Known Allergies  Antimicrobials this admission:  Cefepime 9/25 >>  Vancomycin 9/25 >> 10/4  Dose adjustments this admission: 9/29 VT 27>>change to 1g q24h  Microbiology results: 9/25 Influenza panel negative  9/25 HIV non-reactive  9/25 blood x 2 - NGTD  9/25 urine - multiple species, suggest recollection  9/26 right pleural fluid - no organisms on gram stain, NGTD  9/?? MRSA PCR - pending  9/25 Strep pneuma Ag - negative  9/25 Legionella Ag - neg  Pollyann Samples, PharmD, BCPS 05/11/2017, 11:04 AM

## 2017-05-11 NOTE — Progress Notes (Addendum)
Patient ID: Rhonda Haley, female   DOB: 06/26/1966, 51 y.o.   MRN: [id number]    Supervising Physician: Ruel Favors  Patient Status: [facility] - In-pt  Chief Complaint: Right PTX, s/p thoracentesis  Subjective: Patient with no new complaints.  No pain.    Allergies: Patient has no known allergies.  Medications: Prior to Admission medications   Medication Sig Start Date End Date Taking? Authorizing Provider  escitalopram (LEXAPRO) 20 MG tablet Take 20 mg by mouth daily. 01/18/17  Yes [provider]  feeding supplement, ENSURE ENLIVE, (ENSURE ENLIVE) LIQD Take 237 mLs by mouth 2 (two) times daily between meals. 01/21/17  Yes Ghimire, Werner Lean, MD  ferrous sulfate 325 (65 FE) MG tablet Take 1 tablet (325 mg total) by mouth 2 (two) times daily with a meal. 03/17/17  Yes Zehr, Shanda Bumps D, PA-C  furosemide (LASIX) 40 MG tablet Take 1.5 tablets (60 mg total) by mouth daily. 04/05/17  Yes Iva Boop, MD  Melatonin 10 MG TABS Take 10 mg by mouth at bedtime.   Yes [provider]  Multiple Vitamin (MULTIVITAMIN WITH MINERALS) TABS tablet Take 1 tablet by mouth daily. 03/21/17  Yes Calvert Cantor, MD  pantoprazole (PROTONIX) 20 MG tablet Take 1 tablet (20 mg total) by mouth daily before breakfast. 02/01/17  Yes Iva Boop, MD  potassium chloride SA (K-DUR,KLOR-CON) 20 MEQ tablet Take 1 tablet (20 mEq total) by mouth daily. 02/24/17 05/05/2017 Yes Beola Cord, MD  prednisoLONE (MILLIPRED) 5 MG TABS tablet PER LAST NOTE FROM 04/25/17 YOU SHOULD BE DOING ALREADY,Taper your prednisone as follows:  starting tomorrow for 4 Days then go to  for 4 days, then  for 4 days, then  for 4 days then STOP 04/24/17  Yes Iva Boop, MD  spironolactone (ALDACTONE) 100 MG tablet Take 2 tablets (200 mg total) by mouth daily. Patient taking differently: Take 250 mg by mouth daily.  04/20/17  Yes Iva Boop, MD  thiamine (VITAMIN B-1) 100 MG tablet Take 1 tablet (100 mg total) by  mouth daily. 04/20/17  Yes Iva Boop, MD    Vital Signs: BP (!) 85/47 (BP Location: Left Arm)   Pulse 96   Temp 98.3 F (36.8 C) (Oral)   Resp (!) 33   Ht  (1.727 m)   Wt 115 lb 11.9 oz (52.5 kg)   SpO2 98%   BMI 17.60 kg/m   Physical Exam: Chest: chest drain in place.  No air leak.  She is draining quite a bit now.  She drained 120cc in 10 minutes since cannister changed.   Imaging: Dg Chest 2 View  Result Date: 05/10/2017 CLINICAL DATA:  Shortness of breath. Cirrhosis. Ascites. Right chest tube. EXAM: CHEST  2 VIEW COMPARISON:  05/09/2017 FINDINGS: Right pigtail thoracostomy tube remains in place. Moderate right hydropneumothorax for cysts, possibly with somewhat less pleural fluid. Widespread airspace density remains, more extensive in the left lung than the right. No pleural fluid seen on the left. IMPRESSION: Right thoracostomy tube remains in place. Slightly less right-sided pleural fluid. Persistent right hydropneumothorax. Persistent patchy airspace density bilaterally, more extensive on the left than the right. Electronically Signed   By: Paulina Fusi M.D.   On: 05/10/2017 08:14   Dg Chest 2 View  Result Date: 05/08/2017 CLINICAL DATA:  51 year old female with history of right-sided hemothorax. Evaluate chest tube physician. EXAM: CHEST  2 VIEW COMPARISON:  Chest x-ray a 05/07/2017. FINDINGS: Interval placement of a small bore right-sided  chest tube with pigtail reformed in the upper right hemithorax. Previously noted large right-sided hydropneumothorax has decreased slightly in size, currently a occupying approximately 50% of the volume of the right hemithorax. Widespread interstitial and airspace disease again noted throughout both lungs (left greater than right). Heart size is normal. Mediastinal contours remain slightly displaced to the left, suggesting potential tension component. IMPRESSION: 1. Decreased size of what continues to be a large right-sided  hydropneumothorax following right-sided chest tube placement. The continues to be some evidence of mild right to left midline shift, suggesting some tension. 2. Persistent asymmetrically distributed multifocal interstitial and airspace disease throughout all aspects of the lungs bilaterally (left greater than right), concerning for multilobar pneumonia. Electronically Signed   By: Trudie Reed M.D.   On: 05/08/2017 07:44   Ct Chest Wo Contrast  Result Date: 05/10/2017 CLINICAL DATA:  Pleural effusion. Alcoholism and hepatic hydrothorax. Anemia. EXAM: CT CHEST WITHOUT CONTRAST TECHNIQUE: Multidetector CT imaging of the chest was performed following the standard protocol without IV contrast. COMPARISON:  Chest x-ray 05/09/2017 FINDINGS: Cardiovascular: Heart is normal in size. Minimal calcified plaque of the thoracic aorta. Remaining vascular structures are unremarkable. Mediastinum/Nodes: Several small mediastinal lymph nodes likely reactive. No definite hilar adenopathy or axillary adenopathy. Remaining mediastinal structures are unremarkable. Lungs/Pleura: Lungs are adequately inflated and demonstrate a patchy bilateral airspace process left worse than right likely infection. Large right pleural effusion with associated basilar atelectasis. Small pneumothorax over the anterior mid to lower right thorax. Right-sided small caliber pigtail pleural drainage catheter in place. Airways are within normal. Upper Abdomen: Mild nodular contour to the left lobe of the liver. Musculoskeletal: Within normal. IMPRESSION: Bilateral patchy airspace process left worse than right likely infection. Large right pleural effusion with small anterior pneumothorax over the right mid to lower lung. Small caliber right-sided pigtail pleural drainage catheter in place. Mild right basilar atelectasis. Aortic Atherosclerosis (ICD10-I70.0). Electronically Signed   By: Elberta Fortis M.D.   On: 05/10/2017 00:47   Dg Chest Port 1  View  Result Date: 05/09/2017 CLINICAL DATA:  Pleural effusion with chest tube in place EXAM: PORTABLE CHEST 1 VIEW COMPARISON:  May 08, 2017 FINDINGS: Chest tube remains on the right. There is a pneumothorax on the right which appears smaller compared to 1 day prior. There may be a slight tension component given slight shift of heart and mediastinum toward the left. This finding is stable. There is a fairly sizable partially loculated pleural effusion on the right. There is underlying interstitial and patchy alveolar edema bilaterally. No new opacity is evident. Heart is upper normal in size with a degree of pulmonary venous hypertension evident. No evident adenopathy. No bone lesions. IMPRESSION: Persistent chest tube on the right without change in position. The pneumothorax on the right appears smaller compared to 1 day prior. There may be mild tension component, stable. There remains sizable partially loculated pleural effusion on the right. There is a degree of underlying edema, likely due to underlying congestive heart failure. Patchy infiltrate on the left potentially could represent superimposed pneumonia. These changes are stable. The cardiac silhouette is stable. Electronically Signed   By: Bretta Bang III M.D.   On: 05/09/2017 07:46   Dg Chest Port 1 View  Result Date: 05/07/2017 CLINICAL DATA:  Right-sided pneumothorax. EXAM: PORTABLE CHEST 1 VIEW COMPARISON:  Chest radiograph - earlier same day; 05/06/2017; 05/04/2017 FINDINGS: Grossly unchanged cardiac silhouette and mediastinal contours with atherosclerotic plaque within the thoracic aorta. There has been continued increase  in size of right-sided pneumothorax, now with minimal amount of right to left mediastinal shift. Pulmonary vasculature remains indistinct with cephalization of flow. Unchanged small layering right-sided effusion. No acute osseus abnormalities. IMPRESSION: 1. Continued increase in size of moderate-sized right-sided  pneumothorax, now with minimal amount of right to left mediastinal shift. 2. Similar findings suggestive of pulmonary edema and bibasilar opacities, left greater than right, atelectasis versus infiltrate. PLAN: Given suspected continued increase in size of right-sided pneumothorax, the decision was made to proceed with CT-guided chest tube placement. Electronically Signed   By: Simonne Come M.D.   On: 05/07/2017 14:07   Ct Perc Pleural Drain W/indwell Cath W/img Guide  Result Date: 05/07/2017 INDICATION: Enlarging right-sided pneumothorax post thoracentesis. Please perform CT-guided chest tube placement. EXAM: CT PERC PLEURAL DRAIN W/INDWELL CATH W/IMG GUIDE COMPARISON:  Chest radiograph - earlier same day; 05/06/2017; 05/05/2017 MEDICATIONS: The patient is currently admitted to the hospital and receiving intravenous antibiotics. The antibiotics were administered within an appropriate time frame prior to the initiation of the procedure. ANESTHESIA/SEDATION: Moderate (conscious) sedation was employed during this procedure. A total of Versed 0.5 mg and Fentanyl 12.5 mcg was administered intravenously. Moderate Sedation Time: 18 minutes. The patient's level of consciousness and vital signs were monitored continuously by radiology nursing throughout the procedure under my direct supervision. CONTRAST:  None COMPLICATIONS: None immediate. PROCEDURE: Informed written consent was obtained from the patient after a discussion of the risks, benefits and alternatives to treatment. The patient was placed supine on the CT gantry and a pre procedural CT was performed re-demonstrating the known right-sided hydropneumothorax. The procedure was planned. A timeout was performed prior to the initiation of the procedure. The skin overlying the ventral aspect the right upper chest was prepped and draped in the usual sterile fashion. The overlying soft tissues were anesthetized with 1% lidocaine with epinephrine. Appropriate  trajectory was planned with the use of a 22 gauge spinal needle. An 18 gauge trocar needle was advanced into the right pleural space and a short Amplatz super stiff wire was coiled within the right pleural space. Appropriate positioning was confirmed with a limited CT scan. The tract was serially dilated allowing placement of a 10 [place] all-purpose drainage catheter. Appropriate positioning was confirmed with a limited postprocedural CT scan. Approximately 500 cc of serous fluid was aspirated following percutaneous drainage catheter placement. The tube was connected to a pleural vac device and sutured in place. A dressing was placed. The patient tolerated the procedure well without immediate post procedural complication. IMPRESSION: Successful CT guided placement of a 10 French all purpose drain catheter into the right pleural space for enlarging right-sided hydropneumothorax. Electronically Signed   By: Simonne Come M.D.   On: 05/07/2017 15:42    Labs:  CBC:  Recent Labs  05/08/17 1023 05/09/17 0218 05/10/17 0225 05/11/17 0241  WBC 13.3* 17.0* 11.6* 14.8*  HGB 11.7* 9.2* 7.9* 8.8*  HCT 34.3* 27.8* 23.2* 26.6*  PLT PLATELETS APPEAR DECREASED 135* 104* 122*    COAGS:  Recent Labs  04/24/2017 1434 05/03/17 0209 05/04/17 0811 05/06/17 0608  INR 1.38 1.56 1.68 1.55  APTT  --  35  --   --     BMP:  Recent Labs  05/08/17 1023 05/09/17 0218 05/10/17 0225 05/11/17 0241  NA 129* 124* 128* 123*  K 5.8* 5.4* 3.8 3.9  CL 100* 93* 93* 89*  CO2 18* GLUCOSE 143* 114* 142* 103*  BUN 57* 53* 52* 56*  CALCIUM 9.8 9.6 8.9 8.5*  CREATININE 1.15* 0.98 1.09* 1.25*  GFRNONAA 53* >60 57* 48*  GFRAA >60 >60 >60 56*    LIVER FUNCTION TESTS:  Recent Labs  05/06/17 0608 05/08/17 1023 05/09/17 0218 05/10/17 0225  BILITOT 7.1* 8.0* 6.0* 4.9*  AST 69* 63* 58* 75*  ALT 75* 74* 74* 82*  ALKPHOS 114 106 109 101  PROT 7.1 6.8 6.1* 5.5*  ALBUMIN 2.0* 2.0* 1.8* 1.7*     Assessment and Plan: 1. Right PTX, s/p thoracentesis, s/p chest tube placement  No new CXR today.  Patient is draining quite a large amount at times of serous drainage.  Suspect drain should remain in place while she is having so much output.  CCM was consulted 2 days ago and ordered a chest CT.  Will see what their recommendations are after they see the patient.  Will follow.  ADDENDUM: D/W Dr. Miles Costain.  He has reviewed her imaging.  We will waterseal her CT today and repeat an x-ray tomorrow and see what that looks like and then decided how to proceed with drain management.  Electronically Signed: Letha Cape 05/11/2017, 11:19 AM   I spent a total of 15 Minutes at the the patient's bedside AND on the patient's hospital floor or unit, greater than 50% of which was counseling/coordinating care for PTX, s/p thora

## 2017-05-11 NOTE — Progress Notes (Signed)
Occupational Therapy Treatment Patient Details Name: Rhonda Haley MRN: [id number] DOB: [birth year] Today's Date: 05/11/2017    History of present illness 51 y.o. female who presents with sepsis from [facility] in the setting of severe ETOH cirrhosis and recurrent pleural effusion   OT comments  Pt moves very slowly, and with very limited activity tolerance.  She continues to require mod A, overall for ADLs and min A for functional mobility - fatigues rapidly.  Recommend SNF.   Follow Up Recommendations  SNF;Supervision/Assistance - 24 hour    Equipment Recommendations  3 in 1 bedside commode    Recommendations for Other Services      Precautions / Restrictions Precautions Precautions: Fall Precaution Comments: watch sats       Mobility Bed Mobility Overal bed mobility: Needs Assistance Bed Mobility: Supine to Sit     Supine to sit: Min guard     General bed mobility comments: pt moves very slowly.  Requires increased time and rest breaks to achieve EOB sitting   Transfers Overall transfer level: Needs assistance Equipment used: Rolling walker (2 wheeled) Transfers: Sit to/from [employer] Sit to Stand: Min assist Stand pivot transfers: Min assist       General transfer comment: assist for balance and mod cues for walker safety     Balance Overall balance assessment: Needs assistance Sitting-balance support: No upper extremity supported;Feet supported;Bilateral upper extremity supported Sitting balance-Leahy Scale: Fair     Standing balance support: Bilateral upper extremity supported;During functional activity Standing balance-Leahy Scale: Poor Standing balance comment: requires min a and bil. UE support                            ADL either performed or assessed with clinical judgement   ADL Overall ADL's : Needs assistance/impaired                     Lower Body Dressing: Moderate assistance;Sit to/from stand Lower Body  Dressing Details (indicate cue type and reason): Pt able to don socks.  Min A to move into standing.  Assist to pull pants over hips  Toilet Transfer: Minimal assistance;Ambulation;BSC;RW Toilet Transfer Details (indicate cue type and reason): Min a for balance, and mod verbal cues for walker safety          Functional mobility during ADLs: Minimal assistance;Rolling walker       Vision       Perception     Praxis      Cognition Arousal/Alertness: Awake/alert Behavior During Therapy: Flat affect;WFL for tasks assessed/performed Overall Cognitive Status: Impaired/Different from baseline                     Current Attention Level: Selective         Problem Solving: Slow processing;Decreased initiation;Requires verbal cues          Exercises     Shoulder Instructions       General Comments BP 91/58; HR 121     Pertinent Vitals/ Pain       Pain Assessment: No/denies pain  Home Living                                          Prior Functioning/Environment              Frequency  Min 2X/week  Progress Toward Goals  OT Goals(current goals can now be found in the care plan section)  Progress towards OT goals: Progressing toward goals     Plan Discharge plan remains appropriate    Co-evaluation                 AM-PAC PT "6 Clicks" Daily Activity     Outcome Measure   Help from another person eating meals?: A Little Help from another person taking care of personal grooming?: A Little Help from another person toileting, which includes using toliet, bedpan, or urinal?: A Lot Help from another person bathing (including washing, rinsing, drying)?: A Lot Help from another person to put on and taking off regular upper body clothing?: A Little Help from another person to put on and taking off regular lower body clothing?: A Lot 6 Click Score: 15    End of Session Equipment Utilized During Treatment: Rolling  walker  OT Visit Diagnosis: Unsteadiness on feet (R26.81)   Activity Tolerance Patient limited by fatigue   Patient Left in chair;with call bell/phone within reach;with family/visitor present   Nurse Communication Mobility status        Time: 4098-1191 OT Time Calculation (min): 25 min  Charges: OT General Charges $OT Visit: 1 Visit OT Treatments $Therapeutic Activity: 23-37 mins  [employer], OTR/L [phone]    Jeani Hawking M 05/11/2017, 3:06 PM

## 2017-05-11 NOTE — Progress Notes (Signed)
PROGRESS NOTE    Rhonda Haley  WUJ:[id number] DOB: [birth year] DOA: 04/08/2017 PCP: Lewis Moccasin, MD   Chief Complaint  Patient presents with  . Fever  . Shortness of Breath    Brief Narrative:  HPI On 04/24/2017 by Ms. Junious Silk, NP Leni Pankonin is a 51 y.o. female with medical history significant for alcoholic cirrhosis with associated ascites, recurrent right hepatic hydrothorax, chronic hyponatremia, protein calorie malnutrition and anemia. Patient had been started on low-dose prednisone (03/21/17) for acute alcoholic hepatitis in the setting of weakly positive autoimmune markers. Over the past 2 weeks prednisone has been tapered to off with last dosage on 9/24. Patient has had asymptomatic leukocytosis dating back to 8/21 where her white count was documented at 23,300 with repeat CBC on 9/12 white count 29,900. This morning patient awakened with generalized malaise, weakness, chills and subjective fevers. She is not having any abdominal pain, nausea vomiting or diarrhea. She is not had any coughing or sick contacts. She followed up with the GI office she was noted to have a low-grade temperature of 100.4, heart rate was 140, BP was 90/56. Because of the symptoms she was sent to the ER for further evaluation. In the ER she continued with low-grade temperature and tachycardia as well as tachypnea with a low but normal blood pressure readings. Her white count was 20,700 with left shift as previous. Her lactic acid was 2.15. Her sodium was 122. Abdominal exam was benign. Chest x-ray revealed significant right pleural effusion and right basilar opacity. EDP is concerned over possible HCAP and requests evaluation for observation admission. She is not hypoxic at rest. She does report dyspnea on exertion which is chronic.  Interim history Admitted for sepsis secondary to HCAP. She subsequently underwent emergent Thoracentesis and was transferred to SDU. She is improved and GI was consulted for  further evaluation and Recc's. PCCM following for her Respiratory Failure. Patient was started back on Diuretics last night. Felt a little SOB this AM and repeat CXR did not show any worsening of Pleural Effusion. Started Patient on IV Steroids with Solumedrol 40 mg q12h on 05/04/17 and patient improving. Repeat Thoracentesis done by Dr. Grace Isaac and patient had an ex-vacuo pneumothorax after but remained stable.   Overnight the patient appeared to be withdrawing from EtOH so she was placed on CIWA protocol and received Ativan early this AM. The Pneumothorax appeared to get larger today on repeat X-Ray so Dr. Grace Isaac of IR placed a CT Guided 10 French Drainage catheter into the Right Pleural Space yielding 550 cc of additional serous pleural fluid.   PT evaluated and feel as if she needs SNF. Gastroenterology adding Rifaximin for Encephalopathy and Current dose of Lactulose increased. GI also increased Aldactone to 125 mg Daily ad Lasix to 60 mg po Daily.  Assessment & Plan   Sepsis secondary to presumed healthcare associated pneumonia -Patient presented with fevers, chills, tachycardia, tachypnea and relative hypotension with leukocytosis -Patient has had persistent leukocytosis however likely also affected by steroids -Lactic acid mildly elevated -Chest x-ray showed large right pleural effusion, diffuse alveolar opacities in left lung worrisome for pneumonia or pulmonary edema -Blood cultures show no growth to date -Urine culture showed multiple species -Status post thoracentesis -Fluid culture showed no growth to date -Urine Legionella and strep pneumonia antigens negative -Respiratory viral panel influenza PCR unremarkable -Continue cefepime and vancomycin  Acute hypoxic respiratory failure secondary to pneumonia, pleural effusion, hepatic hydrothorax -Weaned off of oxygen, currently on room air and maintaining  O2 sats in the high 90s -PCCM consulted and appreciated -Continue treatment of  underlying conditions  Right Pleural effusion secondary to hepatic hydrothorax -Status post right thoracentesis on 03/19/2017, yielding 2.6 L of clear yellow fluid -Status post thoracentesis on 05/03/2017, yielding 1.2 L of fluid removed -IV fluid discontinued, patient was given 1 dose of IV Lasix -Patient had reported chronic dyspnea on exertion -PCCM consulted and appreciated -Pleural effusion noted to be transudate in -Status post right thoracentesis 05/06/2017 by interventional radiology, Dr. Grace Isaac, 1.4 L removed -Postthoracentesis chest x-ray showed interval development of small presumably ex vacuo pneumothorax -Repeat chest x-ray 05/07/2017 showed continued increase in size of moderate sized right-sided pneumothorax, minimal amount of right to left mediastinal shift. -CT chest tube placement by IR, yielding 550 mL of serous fluid -Chest x-ray 05/08/2017 showed decreased size of a hydropneumothorax, evidence of mild right to left midline shift suggesting some tension -Chest x-ray 05/10/2017, shows right thoracostomy tubes, slightly less right-sided pleural effusion. Persistent right hydropneumothorax. Persistent patchy airspace density bilaterally, more extensive on the left than the right. -Chest tube drainage 360cc today  Decompensated alcoholic services of liver with ascites -Gastroenterology consulted and appreciated -Patient has been on Lasix and spironolactone dose adjustments-Aldactone 25 mg, Lasix 60 mg -Mildly elevated LFTs Patient recently completed prednisone taper on 05/01/2017, per gastroenterology will not need further taper -Patient with minimal ascites and no abdominal pain-likely not SBP, paracentesis was canceled -Gastroenterology recommended continuing antibiotic therapy with protein supplements -Continue lactulose, rifaximin -lactulose frequency decreased to  daily, patient had 5 BM on 10/3  Alcohol abuse/withdrawal -Continue multivitamin, folic acid, thiamine,  CIWA protocol   Chronic hyponatremia -Baseline sodium appears to be 124-125 -Diuretics adjusted as above -Sodium currently 123 -Continue to monitor CMP  Murmur -Patient with loud systolic murmur -Echocardiogram July 2018 shows normal systolic function, normal diastolic function without valvular maladies -As above, blood cultures show no growth to date -Patient will need follow-up with cardiology as an outpatient -Echocardiogram EF 70-75%, grade 1 diastolic dysfunction. Aortic valve seems thickened, mild outflow obstruction, no AI.  Anemia of chronic disease/IDA -Hemoglobin currently 8.8, baseline hemoglobin 9 -Continue iron supplementation -Monitor CBC  Severe Protein calorie malnutrition -Nutrition consult and appreciated, continue supplements  Thrombocytopenia, chronic  -Likely secondary to alcohol use -Platelets currently 122 -Continue to monitor CBC  Hyperbilirubinemia -Likely secondary to alcoholism and decompensated liver cirrhosis with ascites -Continue to monitor CMP  Acute kidney injury -Possibly secondary to hepatorenal syndrome or worsening of creatinine secondary to diuretics -Creatinine currently 1.25 -Continue to monitor CMP  Hyperkalemia -Resolved -Patient was given Kayexalate, potassium currently 3.9 -Continue to monitor CMP  Deconditioning -PT, OT recommended SNF  DVT Prophylaxis  SCDs  Code Status: Full  Family Communication: Husband at bedside  Disposition Plan: Admitted, continue to monitor and stepdown.  Will likely need SNF at discharge  Consultants PCCM Gastroenterology Interventional radiology  Procedures  Thoracentesis 05/03/2017, 1.2 L of fluid removed Echocardiogram Ultrasound-guided right thoracentesis yielding 1.4 L fluid removed on 05/06/2017 CT-guided cyst chest tube placed 05/07/2017 for pneumothorax  Antibiotics   Anti-infectives    Start     Dose/Rate Route Frequency Ordered Stop   05/09/17 0800  ceFEPIme (MAXIPIME)  1 g in dextrose 5 % 50 mL IVPB     1 g 100 mL/hr over 30 Minutes Intravenous Every 12 hours 05/08/17 1543 05/11/17 2359   05/08/17 1500  rifaximin (XIFAXAN) tablet 550 mg     550 mg Oral 2 times daily 05/08/17 1417  05/06/17 2200  vancomycin (VANCOCIN) IVPB 1000 mg/200 mL premix     1,000 mg 200 mL/hr over 60 Minutes Intravenous Every 24 hours 05/06/17 1224 05/11/17 2359   04/10/2017 2200  ceFEPIme (MAXIPIME) 1 g in dextrose 5 % 50 mL IVPB  Status:  Discontinued     1 g 100 mL/hr over 30 Minutes Intravenous Every 8 hours 04/30/2017 2055 05/08/17 1543   05/04/2017 2130  vancomycin (VANCOCIN) IVPB 750 mg/150 ml premix  Status:  Discontinued     750 mg 150 mL/hr over 60 Minutes Intravenous Every 12 hours 04/11/2017 2102 05/06/17 1213   05/06/2017 1615  vancomycin (VANCOCIN) IVPB 1000 mg/200 mL premix     1,000 mg 200 mL/hr over 60 Minutes Intravenous  Once 04/26/2017 1614 04/09/2017 1736   04/25/2017 1615  piperacillin-tazobactam (ZOSYN) IVPB 3.375 g     3.375 g 100 mL/hr over 30 Minutes Intravenous  Once 04/12/2017 1614 04/14/2017 1758      Subjective:   Desaray Marschner seen and examined today.  Patient feeling better today. Denies chest pain. Feels breathing has improved. Had several bowel movements yesterday, only one today. Denies abdominal pain, nausea or vomiting, headache, dizziness.   Objective:   Vitals:   05/10/17 2300 05/11/17 0000 05/11/17 0336 05/11/17 0500  BP: (!) 90/47 (!) 90/51 (!) 90/47   Pulse: 99 96 97   Resp: (!) 36 (!) 32 (!) 27   Temp: 98.5 F (36.9 C)  98.5 F (36.9 C)   TempSrc: Oral  Oral   SpO2: 98% 98% 95%   Weight:    52.5 kg (115 lb 11.9 oz)  Height:        Intake/Output Summary (Last 24 hours) at 05/11/17 0809 Last data filed at 05/11/17 0400  Gross per 24 hour  Intake              540 ml  Output             1590 ml  Net            -1050 ml   Filed Weights   05/09/17 0500 05/10/17 0348 05/11/17 0500  Weight: 49.2 kg (108 lb 7.5 oz) 50 kg (110 lb 3.7 oz) 52.5  kg (115 lb 11.9 oz)   Exam  General: Well developed, well nourished, NAD, appears stated age  HEENT: NCAT, Scleral icterus, mucous membranes moist.   Cardiovascular: S1 S2 auscultated, 3/6SEM, RRR  Respiratory: Diminished breath sounds, right chest tube in place  Abdomen: Soft, nontender, nondistended, + bowel sounds  Extremities: warm dry without cyanosis clubbing or edema  Neuro: AAOx3, nonfocal  Psych: Appropriate and pleasant  Data Reviewed: I have personally reviewed following labs and imaging studies  CBC:  Recent Labs Lab 05/06/17 0608 05/07/17 0924 05/08/17 1023 05/09/17 0218 05/10/17 0225 05/11/17 0241  WBC 20.7* 14.1* 13.3* 17.0* 11.6* 14.8*  NEUTROABS 18.3* 11.7* 10.5* 13.8* 9.2*  --   HGB 10.2* 11.4* 11.7* 9.2* 7.9* 8.8*  HCT 29.2* 33.5* 34.3* 27.8* 23.2* 26.6*  MCV 98.6 101.2* 101.5* 100.0 101.3* 100.8*  PLT 112* 84* PLATELETS APPEAR DECREASED 135* 104* 122*   Basic Metabolic Panel:  Recent Labs Lab 05/05/17 0250 05/06/17 0608 05/08/17 1023 05/09/17 0218 05/10/17 0225 05/11/17 0241  NA 121* 126* 129* 124* 128* 123*  K 4.7 4.9 5.8* 5.4* 3.8 3.9  CL 92* 95* 100* 93* 93* 89*  CO2 23 18* 18* GLUCOSE 215* 127* 143* 114* 142* 103*  BUN 25* 35* 57*  53* 52* 56*  CREATININE 1.01* 0.95 1.15* 0.98 1.09* 1.25*  CALCIUM 8.6* 9.5 9.8 9.6 8.9 8.5*  MG 1.9 2.2 2.6* 2.4 2.1  --   PHOS 3.9 3.8 6.0* 2.9 3.7  --    GFR: Estimated Creatinine Clearance: 43.1 mL/min (A) (by C-G formula based on SCr of 1.25 mg/dL (H)). Liver Function Tests:  Recent Labs Lab 05/05/17 0250 05/06/17 0608 05/08/17 1023 05/09/17 0218 05/10/17 0225  AST 62* 69* 63* 58* 75*  ALT 67* 75* 74* 74* 82*  ALKPHOS 102 114 106 109 101  BILITOT 8.0* 7.1* 8.0* 6.0* 4.9*  PROT 6.3* 7.1 6.8 6.1* 5.5*  ALBUMIN 1.8* 2.0* 2.0* 1.8* 1.7*   No results for input(s): LIPASE, AMYLASE in the last 168 hours.  Recent Labs Lab 05/04/17 0811 05/09/17 0218  AMMONIA 43* 49*    Coagulation Profile:  Recent Labs Lab 05/04/17 0811 05/06/17 0608  INR 1.68 1.55   Cardiac Enzymes: No results for input(s): CKTOTAL, CKMB, CKMBINDEX, TROPONINI in the last 168 hours. BNP (last 3 results) No results for input(s): PROBNP in the last 8760 hours. HbA1C: No results for input(s): HGBA1C in the last 72 hours. CBG: No results for input(s): GLUCAP in the last 168 hours. Lipid Profile: No results for input(s): CHOL, HDL, LDLCALC, TRIG, CHOLHDL, LDLDIRECT in the last 72 hours. Thyroid Function Tests: No results for input(s): TSH, T4TOTAL, FREET4, T3FREE, THYROIDAB in the last 72 hours. Anemia Panel: No results for input(s): VITAMINB12, FOLATE, FERRITIN, TIBC, IRON, RETICCTPCT in the last 72 hours. Urine analysis:    Component Value Date/Time   COLORURINE AMBER (A) 05-29-17 1455   APPEARANCEUR HAZY (A) 05-29-17 1455   LABSPEC 1.015 05/29/2017 1455   PHURINE 6.0 05-29-17 1455   GLUCOSEU NEGATIVE 05/29/17 1455   HGBUR SMALL (A) May 29, 2017 1455   BILIRUBINUR NEGATIVE 29-May-2017 1455   KETONESUR NEGATIVE 05-29-2017 1455   PROTEINUR NEGATIVE May 29, 2017 1455   NITRITE NEGATIVE 29-May-2017 1455   LEUKOCYTESUR TRACE (A) 29-May-2017 1455   Sepsis Labs: @LABRCNTIP (procalcitonin:4,lacticidven:4)  ) Recent Results (from the past 240 hour(s))  Blood culture (routine x 2)     Status: None   Collection Time: 2017-05-29  2:20 PM  Result Value Ref Range Status   Specimen Description BLOOD LEFT ANTECUBITAL  Final   Special Requests   Final    BOTTLES DRAWN AEROBIC AND ANAEROBIC Blood Culture adequate volume   Culture NO GROWTH 5 DAYS  Final   Report Status 05/07/2017 FINAL  Final  Blood culture (routine x 2)     Status: None   Collection Time: 05/29/17  2:25 PM  Result Value Ref Range Status   Specimen Description BLOOD LEFT HAND  Final   Special Requests IN PEDIATRIC BOTTLE Blood Culture adequate volume  Final   Culture NO GROWTH 5 DAYS  Final   Report Status  05/07/2017 FINAL  Final  Urine culture     Status: Abnormal   Collection Time: 05/29/17  2:55 PM  Result Value Ref Range Status   Specimen Description URINE, CLEAN CATCH  Final   Special Requests NONE  Final   Culture MULTIPLE SPECIES PRESENT, SUGGEST RECOLLECTION (A)  Final   Report Status 05/04/2017 FINAL  Final  Respiratory Panel by PCR     Status: None   Collection Time: 29-May-2017 11:29 PM  Result Value Ref Range Status   Adenovirus NOT DETECTED NOT DETECTED Final   Coronavirus 229E NOT DETECTED NOT DETECTED Final   Coronavirus HKU1 NOT DETECTED NOT DETECTED Final  Coronavirus NL63 NOT DETECTED NOT DETECTED Final   Coronavirus OC43 NOT DETECTED NOT DETECTED Final   Metapneumovirus NOT DETECTED NOT DETECTED Final   Rhinovirus / Enterovirus NOT DETECTED NOT DETECTED Final   Influenza A NOT DETECTED NOT DETECTED Final   Influenza B NOT DETECTED NOT DETECTED Final   Parainfluenza Virus 1 NOT DETECTED NOT DETECTED Final   Parainfluenza Virus 2 NOT DETECTED NOT DETECTED Final   Parainfluenza Virus 3 NOT DETECTED NOT DETECTED Final   Parainfluenza Virus 4 NOT DETECTED NOT DETECTED Final   Respiratory Syncytial Virus NOT DETECTED NOT DETECTED Final   Bordetella pertussis NOT DETECTED NOT DETECTED Final   Chlamydophila pneumoniae NOT DETECTED NOT DETECTED Final   Mycoplasma pneumoniae NOT DETECTED NOT DETECTED Final  Body fluid culture     Status: None   Collection Time: 05/03/17  2:10 AM  Result Value Ref Range Status   Specimen Description PLEURAL RIGHT  Final   Special Requests NONE  Final   Gram Stain NO WBC SEEN NO ORGANISMS SEEN   Final   Culture NO GROWTH 3 DAYS  Final   Report Status 05/06/2017 FINAL  Final  MRSA PCR Screening     Status: None   Collection Time: 05/09/17 11:57 AM  Result Value Ref Range Status   MRSA by PCR NEGATIVE NEGATIVE Final    Comment:        The GeneXpert MRSA Assay (FDA approved for NASAL specimens only), is one component of a comprehensive  MRSA colonization surveillance program. It is not intended to diagnose MRSA infection nor to guide or monitor treatment for MRSA infections.       Radiology Studies: Dg Chest 2 View  Result Date: 05/10/2017 CLINICAL DATA:  Shortness of breath. Cirrhosis. Ascites. Right chest tube. EXAM: CHEST  2 VIEW COMPARISON:  05/09/2017 FINDINGS: Right pigtail thoracostomy tube remains in place. Moderate right hydropneumothorax for cysts, possibly with somewhat less pleural fluid. Widespread airspace density remains, more extensive in the left lung than the right. No pleural fluid seen on the left. IMPRESSION: Right thoracostomy tube remains in place. Slightly less right-sided pleural fluid. Persistent right hydropneumothorax. Persistent patchy airspace density bilaterally, more extensive on the left than the right. Electronically Signed   By: Paulina Fusi M.D.   On: 05/10/2017 08:14   Ct Chest Wo Contrast  Result Date: 05/10/2017 CLINICAL DATA:  Pleural effusion. Alcoholism and hepatic hydrothorax. Anemia. EXAM: CT CHEST WITHOUT CONTRAST TECHNIQUE: Multidetector CT imaging of the chest was performed following the standard protocol without IV contrast. COMPARISON:  Chest x-ray 05/09/2017 FINDINGS: Cardiovascular: Heart is normal in size. Minimal calcified plaque of the thoracic aorta. Remaining vascular structures are unremarkable. Mediastinum/Nodes: Several small mediastinal lymph nodes likely reactive. No definite hilar adenopathy or axillary adenopathy. Remaining mediastinal structures are unremarkable. Lungs/Pleura: Lungs are adequately inflated and demonstrate a patchy bilateral airspace process left worse than right likely infection. Large right pleural effusion with associated basilar atelectasis. Small pneumothorax over the anterior mid to lower right thorax. Right-sided small caliber pigtail pleural drainage catheter in place. Airways are within normal. Upper Abdomen: Mild nodular contour to the left  lobe of the liver. Musculoskeletal: Within normal. IMPRESSION: Bilateral patchy airspace process left worse than right likely infection. Large right pleural effusion with small anterior pneumothorax over the right mid to lower lung. Small caliber right-sided pigtail pleural drainage catheter in place. Mild right basilar atelectasis. Aortic Atherosclerosis (ICD10-I70.0). Electronically Signed   By: Elberta Fortis M.D.   On: 05/10/2017 00:47  Scheduled Meds: . escitalopram  20 mg Oral Daily  . feeding supplement (ENSURE ENLIVE)  237 mL Oral TID BM  . ferrous sulfate  325 mg Oral BID WC  . folic acid  1 mg Oral Daily  . furosemide  60 mg Oral Daily  . lactulose  20 g Oral Daily  . mouth rinse  15 mL Mouth Rinse BID  . Melatonin  6 mg Oral QHS  . multivitamin with minerals  1 tablet Oral Daily  . pantoprazole  20 mg Oral QAC breakfast  . rifaximin  550 mg Oral BID  . sodium chloride flush  3 mL Intravenous Q12H  . spironolactone  125 mg Oral Daily  . thiamine injection  100 mg Intravenous Daily   Or  . thiamine  100 mg Oral Daily   Continuous Infusions: . ceFEPime (MAXIPIME) IV Stopped (05/10/17 2153)  . vancomycin Stopped (05/10/17 2350)     LOS: 8 days   Time Spent in minutes   30 minutes  Garnett Nunziata D.O. on 05/11/2017 at 8:09 AM  Between 7am to 7pm - Pager - [phone]  After 7pm go to [email or web address] - password [email or web address]  And look for the night coverage person covering for me after hours  [facility] Office  [phone]

## 2017-05-11 NOTE — Progress Notes (Signed)
[facility] Progress Note  Chief Complaint:   cirrhosis   Subjective: feels "good" today. Just starting her lunch.   Objective:  Vital signs in last 24 hours: Temp:  [97.6 F (36.4 C)-98.5 F (36.9 C)] 98.3 F (36.8 C) (10/04 1110) Pulse Rate:  [89-109] 96 (10/04 0800) Resp:  [21-36] 33 (10/04 0800) BP: (84-90)/(47-51) 85/47 (10/04 0800) SpO2:  [95 %-100 %] 98 % (10/04 0800) Weight:  [115 lb 11.9 oz (52.5 kg)] 115 lb 11.9 oz (52.5 kg) (10/04 0500) Last BM Date: 05/10/17 General:   Alert, white female in NAD EENT:  Normal hearing, non icteric sclera, conjunctive pink.  Heart:  Regular rate and rhythm, no extremity edema Pulm: Normal respiratory effor Abdomen:  Soft, moderately distended,  nontender.  Normal bowel sounds    Neurologic:  Alert and  oriented x4;  grossly normal neurologically. Psych:  Pleasant, cooperative.  Normal mood and affect.   Intake/Output from previous day: 10/03 0701 - 10/04 0700 In: 780 [P.O.:480; IV Piggyback:300] Out: 1590 [Urine:1300; Chest Tube:290] Intake/Output this shift: Total I/O In: 50 [IV Piggyback:50] Out: 500 [Chest Tube:500]  Lab Results:  Recent Labs  05/09/17 0218 05/10/17 0225 05/11/17 0241  WBC 17.0* 11.6* 14.8*  HGB 9.2* 7.9* 8.8*  HCT 27.8* 23.2* 26.6*  PLT 135* 104* 122*   BMET  Recent Labs  05/09/17 0218 05/10/17 0225 05/11/17 0241  NA 124* 128* 123*  K 5.4* 3.8 3.9  CL 93* 93* 89*  CO2 GLUCOSE 114* 142* 103*  BUN 53* 52* 56*  CREATININE 0.98 1.09* 1.25*  CALCIUM 9.6 8.9 8.5*   LFT  Recent Labs  05/10/17 0225  PROT 5.5*  ALBUMIN 1.7*  AST 75*  ALT 82*  ALKPHOS 101  BILITOT 4.9*   Dg Chest 2 View  Result Date: 05/10/2017 CLINICAL DATA:  Shortness of breath. Cirrhosis. Ascites. Right chest tube. EXAM: CHEST  2 VIEW COMPARISON:  05/09/2017 FINDINGS: Right pigtail thoracostomy tube remains in place. Moderate right hydropneumothorax for cysts, possibly with somewhat  less pleural fluid. Widespread airspace density remains, more extensive in the left lung than the right. No pleural fluid seen on the left. IMPRESSION: Right thoracostomy tube remains in place. Slightly less right-sided pleural fluid. Persistent right hydropneumothorax. Persistent patchy airspace density bilaterally, more extensive on the left than the right. Electronically Signed   By: Rhonda Haley M.D.   On: 05/10/2017 08:14   Ct Chest Wo Contrast  Result Date: 05/10/2017 CLINICAL DATA:  Pleural effusion. Alcoholism and hepatic hydrothorax. Anemia. EXAM: CT CHEST WITHOUT CONTRAST TECHNIQUE: Multidetector CT imaging of the chest was performed following the standard protocol without IV contrast. COMPARISON:  Chest x-ray 05/09/2017 FINDINGS: Cardiovascular: Heart is normal in size. Minimal calcified plaque of the thoracic aorta. Remaining vascular structures are unremarkable. Mediastinum/Nodes: Several small mediastinal lymph nodes likely reactive. No definite hilar adenopathy or axillary adenopathy. Remaining mediastinal structures are unremarkable. Lungs/Pleura: Lungs are adequately inflated and demonstrate a patchy bilateral airspace process left worse than right likely infection. Large right pleural effusion with associated basilar atelectasis. Small pneumothorax over the anterior mid to lower right thorax. Right-sided small caliber pigtail pleural drainage catheter in place. Airways are within normal. Upper Abdomen: Mild nodular contour to the left lobe of the liver. Musculoskeletal: Within normal. IMPRESSION: Bilateral patchy airspace process left worse than right likely infection. Large right pleural effusion with small anterior pneumothorax over the right mid to lower lung. Small caliber right-sided pigtail pleural drainage catheter  in place. Mild right basilar atelectasis. Aortic Atherosclerosis (ICD10-I70.0). Electronically Signed   By: Rhonda Haley M.D.   On: 05/10/2017 00:47    ASSESSMENT / PLAN:      1. ETOH hepatitis superimposed on decompensated cirrhosis with PSE and ascites.  No varices on recent EGD.  Completed 30 days of Prednisolone. Bili continually improving.  -Mental status normal at present but RN says at times patient blurts out random / unrelated things. On Xifaxan and getting lactulose once daily. Goal is for 2-3 loose BMs a day.  -She is getting diuretics but not back to previous dosage yet because of AKI. Cr bumped up again last night.  -continue aldactone 125 / lasix 60 mg with caution.  -am BMET -2 gram sodium diet. Watch diet closely, looks like husband brought in lunch for her.  -HCC screening - normal AFP. Indeterminate 1 cm liver lesion on MRI. Probably needs follow up imaging in 6 months  2. 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 She is s/p chest tube placement. Still having large amount of drainage. CCM on board and ordered Chest CT scan yesterday reveaing bilateral patchy airspace process left > right. Large right pleural effusion with small pneumothorax.   3. Leukocytosis, WBC was improving but rose overnight 11.6 >>>14.8.      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 I agree with the Advanced Practitioner's note, impression and recommendations. If Cr increases again tomorrow will need to reduce diuretic dosage. Hepatic hydrothorax, pleural effusion, PNA followed by IR and Pulm. Check BMET, LFTs, PT/INR tomorrow.  Rhonda Head, MD [facility] [phone] Mon-Fri 8a-5p [phone] after 5p, weekends, holidays

## 2017-05-12 ENCOUNTER — Inpatient Hospital Stay (HOSPITAL_COMMUNITY): Payer: 59

## 2017-05-12 DIAGNOSIS — R197 Diarrhea, unspecified: Secondary | ICD-10-CM

## 2017-05-12 DIAGNOSIS — J189 Pneumonia, unspecified organism: Secondary | ICD-10-CM

## 2017-05-12 LAB — CBC
HCT: 27.5 % — ABNORMAL LOW (ref 36.0–46.0)
Hemoglobin: 9.1 g/dL — ABNORMAL LOW (ref 12.0–15.0)
MCH: 33.3 pg (ref 26.0–34.0)
MCHC: 33.1 g/dL (ref 30.0–36.0)
MCV: 100.7 fL — AB (ref 78.0–100.0)
PLATELETS: 120 10*3/uL — AB (ref 150–400)
RBC: 2.73 MIL/uL — AB (ref 3.87–5.11)
RDW: 14.9 % (ref 11.5–15.5)
WBC: 17.3 10*3/uL — AB (ref 4.0–10.5)

## 2017-05-12 LAB — PROTIME-INR
INR: 1.52
Prothrombin Time: 18.2 seconds — ABNORMAL HIGH (ref 11.4–15.2)

## 2017-05-12 LAB — HEPATIC FUNCTION PANEL
ALBUMIN: 1.8 g/dL — AB (ref 3.5–5.0)
ALT: 90 U/L — AB (ref 14–54)
AST: 82 U/L — AB (ref 15–41)
Alkaline Phosphatase: 107 U/L (ref 38–126)
Bilirubin, Direct: 2.6 mg/dL — ABNORMAL HIGH (ref 0.1–0.5)
Indirect Bilirubin: 3.1 mg/dL — ABNORMAL HIGH (ref 0.3–0.9)
TOTAL PROTEIN: 5.7 g/dL — AB (ref 6.5–8.1)
Total Bilirubin: 5.7 mg/dL — ABNORMAL HIGH (ref 0.3–1.2)

## 2017-05-12 LAB — BASIC METABOLIC PANEL
Anion gap: 10 (ref 5–15)
BUN: 67 mg/dL — AB (ref 6–20)
CHLORIDE: 88 mmol/L — AB (ref 101–111)
CO2: 26 mmol/L (ref 22–32)
CREATININE: 1.62 mg/dL — AB (ref 0.44–1.00)
Calcium: 8.6 mg/dL — ABNORMAL LOW (ref 8.9–10.3)
GFR, EST AFRICAN AMERICAN: 41 mL/min — AB (ref >60)
GFR, EST NON AFRICAN AMERICAN: 35 mL/min — AB (ref >60)
Glucose, Bld: 101 mg/dL — ABNORMAL HIGH (ref 65–99)
Potassium: 5 mmol/L (ref 3.5–5.1)
SODIUM: 124 mmol/L — AB (ref 135–145)

## 2017-05-12 LAB — PROCALCITONIN: Procalcitonin: 0.54 ng/mL

## 2017-05-12 MED ORDER — LACTULOSE 10 GM/15ML PO SOLN
10.0000 g | Freq: Every day | ORAL | Status: DC
  Administered 2017-05-13 – 2017-05-18 (×6): 10 g via ORAL
  Filled 2017-05-12 (×6): qty 15

## 2017-05-12 MED ORDER — FUROSEMIDE 40 MG PO TABS
40.0000 mg | ORAL_TABLET | Freq: Every day | ORAL | Status: DC
  Administered 2017-05-13 – 2017-05-14 (×2): 40 mg via ORAL
  Filled 2017-05-12 (×2): qty 1

## 2017-05-12 MED ORDER — SPIRONOLACTONE 25 MG PO TABS
100.0000 mg | ORAL_TABLET | Freq: Every day | ORAL | Status: DC
  Administered 2017-05-13 – 2017-05-14 (×2): 100 mg via ORAL
  Filled 2017-05-12 (×2): qty 4

## 2017-05-12 MED ORDER — DEXTROSE 5 % IV SOLN
2.0000 g | INTRAVENOUS | Status: DC
  Administered 2017-05-12 – 2017-05-14 (×3): 2 g via INTRAVENOUS
  Filled 2017-05-12 (×3): qty 2

## 2017-05-12 MED ORDER — ALBUMIN HUMAN 25 % IV SOLN
25.0000 g | Freq: Four times a day (QID) | INTRAVENOUS | Status: AC
  Administered 2017-05-12 – 2017-05-13 (×3): 25 g via INTRAVENOUS
  Filled 2017-05-12 (×3): qty 100

## 2017-05-12 NOTE — Progress Notes (Signed)
Daily Rounding Note  05/12/2017, 11:16 AM  LOS: 9 days   SUBJECTIVE:   Chief complaint:  7 or 8 loose, brown stool yesterday, 3 or 4 today.  Stool C diff ordered by MD.  Pt still only eating 25 to 50% of meals plus ensure bid.     No n/v.  Feels better, slightly stronger.  Chest tube removed this AM.    OBJECTIVE:         Vital signs in last 24 hours:    Temp:  [97.9 F (36.6 C)-98.4 F (36.9 C)] 97.9 F (36.6 C) (10/05 0800) Pulse Rate:  [86-99] 86 (10/05 0800) Resp:  [23-37] 23 (10/05 0800) BP: (80-90)/(41-53) 90/49 (10/05 0800) SpO2:  [97 %-100 %] 100 % (10/05 0800) Weight:  [50.7 kg (111 lb 12.4 oz)] 50.7 kg (111 lb 12.4 oz) (10/05 0325) Last BM Date: 05/10/17 Filed Weights   05/10/17 0348 05/11/17 0500 05/12/17 0325  Weight: 50 kg (110 lb 3.7 oz) 52.5 kg (115 lb 11.9 oz) 50.7 kg (111 lb 12.4 oz)   General: jaundiced, ill looking: the same as always.   Heart: RRR Chest: diminished BS on right.  No labored breathing or cough Abdomen: soft, NT, ND.  Active BS.  No ascites  Extremities: no CCE Neuro/Psych:  Oriented x 3.  No tremor.  Fully alert.  A bit slow in responding to questions but accurate and clear speech.    Intake/Output from previous day: 10/04 0701 - 10/05 0700 In: 460 [P.O.:360; IV Piggyback:100] Out: 850 [Chest Tube:850]  Intake/Output this shift: Total I/O In: 50 [IV Piggyback:50] Out: 660 [Chest Tube:660]  Lab Results:  Recent Labs  05/10/17 0225 05/11/17 0241 05/12/17 0323  WBC 11.6* 14.8* 17.3*  HGB 7.9* 8.8* 9.1*  HCT 23.2* 26.6* 27.5*  PLT 104* 122* 120*   BMET  Recent Labs  05/10/17 0225 05/11/17 0241 05/12/17 0323  NA 128* 123* 124*  K 3.8 3.9 5.0  CL 93* 89* 88*  CO2 GLUCOSE 142* 103* 101*  BUN 52* 56* 67*  CREATININE 1.09* 1.25* 1.62*  CALCIUM 8.9 8.5* 8.6*   LFT  Recent Labs  05/10/17 0225 05/12/17 0323  PROT 5.5* 5.7*  ALBUMIN 1.7* 1.8*  AST  75* 82*  ALT 82* 90*  ALKPHOS 101 107  BILITOT 4.9* 5.7*  BILIDIR  --  2.6*  IBILI  --  3.1*   PT/INR  Recent Labs  05/12/17 0323  LABPROT 18.2*  INR 1.52   Hepatitis Panel No results for input(s): HEPBSAG, HCVAB, HEPAIGM, HEPBIGM in the last 72 hours.  Studies/Results: Dg Chest Port 1 View  Result Date: 05/12/2017 CLINICAL DATA:  Follow-up Right hydropneumothorax EXAM: PORTABLE CHEST 1 VIEW COMPARISON:  05/10/2017 FINDINGS: Cardiac shadow is stable. Right-sided chest tube is noted in satisfactory position. Persistent right pleural effusion is seen. A small right pneumothorax is again seen and stable. Lungs are well aerated. Patchy infiltrates remain in the left lung. IMPRESSION: Stable appearance of the chest when compared with the previous exam. Electronically Signed   By: Alcide Clever M.D.   On: 05/12/2017 07:21   Scheduled Meds: . escitalopram  20 mg Oral Daily  . feeding supplement (ENSURE ENLIVE)  237 mL Oral TID BM  . ferrous sulfate  325 mg Oral BID WC  . folic acid  1 mg Oral Daily  . furosemide  60 mg Oral Daily  . lactulose  20 g Oral Daily  .  mouth rinse  15 mL Mouth Rinse BID  . Melatonin  6 mg Oral QHS  . multivitamin with minerals  1 tablet Oral Daily  . pantoprazole  20 mg Oral QAC breakfast  . rifaximin  550 mg Oral BID  . sodium chloride flush  3 mL Intravenous Q12H  . spironolactone  125 mg Oral Daily  . thiamine injection  100 mg Intravenous Daily   Or  . thiamine  100 mg Oral Daily   Continuous Infusions: . ceFEPime (MAXIPIME) IV Stopped (05/12/17 1022)   PRN Meds:.LORazepam, ondansetron **OR** ondansetron (ZOFRAN) IV    ASSESMENT:   *  Alcoholic hepatitis, decompensated cirrhosis.    *  Hepatic hydrothorax. And bil HCAP.  Right thoracentesis complicated by PTX requiring chest tube.  Consult by CCM on 10/2, ordered CT chest, but not seen since.  Renal function worsening on the diuretics Aldactone 125 mg, Furosemide 60 mg.   Continues abx:  Maxipime, day 11, on Vanc 9/25 - 9/29.    *  Chronic hyponatremia.  On 2 gm Na diet.  Not on fluid restrictions.    *  HE.  On Rifaximin and Lactulose.  Ammonia only reached into 40s at worst and not really reliable indicator.  Would follow clinical exam rather than ammonia, so d/c'd tomorrows Ammonia order.   *  Anemia.  FOBT negative on 10/4.  Macrocytic.  Hgb nadir of 7.9 on 10/3, up to 9.1 today.  On BID po iron.    *  AKI.  Renal labs worse in last 4 days.   *  Chronic thrombocytopenia.    *  Diarrhea, ? c diff vs the lactulose.  On 20 gm daily, was BID until 10/4.     PLAN   *  Reducing Aldactone to 100 mg and Lasix to 40 mg.  But please note she got the 125/60 doses this AM.  BMET in AM.    *  Reduce lactulose to 10 gm daily, starting tomorrow, as already got today's dose. Await C diff, not yet collected.    Jennye Moccasin  05/12/2017, 11:16 AM Pager: [phone]     Attending physician's note   I have taken an interval history, reviewed the chart and examined the patient. I agree with the Advanced Practitioner's note, impression and recommendations. Cr has increased again. If Cr has increased tomorrow would stop all diurectics and consult renal. Reducing lactulose to titrate for 2-3 loose stools per day. Await C diff.   Claudette Head, MD [facility] [phone] Mon-Fri 8a-5p [phone] after 5p, weekends, holidays

## 2017-05-12 NOTE — NC FL2 (Signed)
[place] MEDICAID FL2 LEVEL OF CARE SCREENING TOOL     IDENTIFICATION  Patient Name: Rhonda Haley Birthdate: [birth year] Sex: female Admission Date (Current Location): 04/15/2017  [facility] and [place] Number:  Producer, television/film/video and Address:  [facility], [address] [place], [place] [address]      Provider Number: [id number]  Attending Physician Name and Address:  Edsel Petrin, DO  Relative Name and Phone Number:       Current Level of Care: Hospital Recommended Level of Care: Skilled Nursing Facility Prior Approval Number:    Date Approved/Denied:   PASRR Number: [id number]  Discharge Plan: SNF    Current Diagnoses: Patient Active Problem List   Diagnosis Date Noted  . Chest tube in place   . Hyperkalemia 05/08/2017  . AKI (acute kidney injury) (HCC) 05/08/2017  . Pleural effusion associated with hepatic disorder 05/03/2017  . Acute respiratory distress   . Fever, unspecified 05/01/2017  . Hydropneumothorax 04/29/2017  . Sepsis (HCC) 05/07/2017  . Anemia 04/11/2017  . Protein calorie malnutrition (HCC) 04/13/2017  . Chronic hyponatremia 04/26/2017  . HCAP (healthcare-associated pneumonia) 05/03/2017  . Murmur, cardiac 05/07/2017  . Abnormal MRI, kidney 03/30/2017  . Alcoholism (HCC)   . Alcoholic hepatitis with ascites 03/21/2017  . Coagulopathy (HCC) 03/21/2017  . Hyponatremia 03/21/2017  . Thrombocytopenia (HCC) 03/21/2017  . 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 Ascites due to alcoholic cirrhosis (HCC)   . Jaundice 03/09/2017  . Cholelithiasis 02/24/2017  . Abnormal MRI, liver 02/24/2017  . Protein-calorie malnutrition, severe 02/21/2017  . Normocytic anemia, not due to blood loss 01/19/2017  . Alcoholic cirrhosis of liver without ascites (HCC) 01/19/2017  . 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 01/19/2017    Orientation RESPIRATION BLADDER Height & Weight     Self, Time, Place  Normal Incontinent Weight: 111 lb 12.4 oz (50.7 kg) Height:   (172.7 cm)  BEHAVIORAL SYMPTOMS/MOOD NEUROLOGICAL BOWEL NUTRITION STATUS  Other (Comment) (Flat affect)   Incontinent Diet (2 gram sodium)  AMBULATORY STATUS COMMUNICATION OF NEEDS Skin   Limited Assist Verbally Bruising                       Personal Care Assistance Level of Assistance  Bathing, Feeding, Dressing Bathing Assistance: Limited assistance Feeding assistance: Limited assistance Dressing Assistance: Limited assistance     Functional Limitations Info  Sight, Hearing, Speech Sight Info: Adequate Hearing Info: Adequate Speech Info: Adequate    SPECIAL CARE FACTORS FREQUENCY  PT (By licensed PT), OT (By licensed OT)     PT Frequency: 5 x week OT Frequency: 5 x week            Contractures Contractures Info: Not present    Additional Factors Info  Code Status, Allergies, Isolation Precautions Code Status Info: Full Allergies Info: NKDA     Isolation Precautions Info: Enteric precautions: Ruling out cdiff.     Current Medications (05/12/2017):  This is the current hospital active medication list Current Facility-Administered Medications  Medication Dose Route Frequency Provider Last Rate Last Dose  . albumin human 25 % solution 25 g  25 g Intravenous Q6H Lynnell Jude, MD   25 g at 05/12/17 1439  . ceFEPIme (MAXIPIME) 2 g in dextrose 5 % 50 mL IVPB  2 g Intravenous Q24H Edsel Petrin, DO   Stopped at 05/12/17 1022  . escitalopram (LEXAPRO) tablet 20 mg  20 mg Oral Daily Rennis Harding,  Kelle Darting, NP   20 mg at 05/12/17 0954  . feeding supplement (ENSURE ENLIVE) (ENSURE ENLIVE) liquid 237 mL  237 mL Oral TID BM Sheikh, Omair Latif, DO   237 mL at 05/12/17 1358  . ferrous sulfate tablet 325 mg  325 mg Oral BID WC Russella Dar, NP   325 mg at 05/12/17 0807  . folic acid (FOLVITE) tablet 1 mg  1 mg Oral Daily Marguerita Merles [place], DO   1 mg at 05/12/17 4098  . [START ON 05/13/2017] furosemide  (LASIX) tablet 40 mg  40 mg Oral Daily Dianah Field, PA-C      . [START ON 05/13/2017] lactulose (CHRONULAC) 10 GM/15ML solution 10 g  10 g Oral Daily Dianah Field, PA-C      . LORazepam (ATIVAN) injection 2-3 mg  2-3 mg Intravenous Q1H PRN Opyd, Lavone Neri, MD      . MEDLINE mouth rinse  15 mL Mouth Rinse BID Sheikh, Omair Latif, DO   15 mL at 05/12/17 1022  . Melatonin TABS 6 mg  6 mg Oral QHS Marguerita Merles [place], DO   6 mg at 05/11/17 2118  . multivitamin with minerals tablet 1 tablet  1 tablet Oral Daily Opyd, Lavone Neri, MD   1 tablet at 05/12/17 0953  . ondansetron (ZOFRAN) tablet 4 mg  4 mg Oral Q6H PRN Russella Dar, NP       Or  . ondansetron [facility]) injection 4 mg  4 mg Intravenous Q6H PRN Russella Dar, NP      . pantoprazole (PROTONIX) EC tablet 20 mg  20 mg Oral QAC breakfast Russella Dar, NP   20 mg at 05/12/17 0807  . rifaximin (XIFAXAN) tablet 550 mg  550 mg Oral BID Dianah Field, PA-C   550 mg at 05/12/17 1191  . sodium chloride flush (NS) 0.9 % injection 3 mL  3 mL Intravenous Q12H Junious Silk L, NP   3 mL at 05/12/17 1023  . [START ON 05/13/2017] spironolactone (ALDACTONE) tablet 100 mg  100 mg Oral Daily Gribbin, Shirell Struthers J, PA-C      . thiamine (B-1) injection 100 mg  100 mg Intravenous Daily Marguerita Merles Latif, DO   100 mg at 05/08/17 0930   Or  . thiamine (VITAMIN B-1) tablet 100 mg  100 mg Oral Daily Marguerita Merles [place], DO   100 mg at 05/12/17 4782     Discharge Medications: Please see discharge summary for a list of discharge medications.  Relevant Imaging Results:  Relevant Lab Results:   Additional Information SS#: [id number]. Chest tube removed 10/5.  Margarito Liner, LCSW

## 2017-05-12 NOTE — Clinical Social Work Note (Signed)
CSW continues to follow for discharge needs.  Fionn Stracke, CSW [phone]  

## 2017-05-12 NOTE — Progress Notes (Signed)
Pharmacy Antibiotic Note Rhonda Haley is a 51 y.o. female admitted on 04/16/2017 with pneumonia, pleural effussion and hepatic hydrothorax. Currently on day 11 of Cefepime for treatment. Vancomycin stopped 10/4.  SCr/BUN continue to increase. WBC 17.8, afebrile with no positive cx data.     Plan: 1. Adjust Cefepime to 2 grams IV every 24 hours     Height:  (172.7 cm) Weight: 111 lb 12.4 oz (50.7 kg) IBW/kg (Calculated) : 63.9  Temp (24hrs), Avg:98.2 F (36.8 C), Min:97.9 F (36.6 C), Max:98.4 F (36.9 C)   Recent Labs Lab 05/06/17 0953  05/08/17 1023 05/09/17 0218 05/10/17 0225 05/11/17 0241 05/12/17 0323  WBC  --   < > 13.3* 17.0* 11.6* 14.8* 17.3*  CREATININE  --   --  1.15* 0.98 1.09* 1.25* 1.62*  VANCOTROUGH 27*  --   --   --   --   --   --   < > = values in this interval not displayed.  Estimated Creatinine Clearance: 32.1 mL/min (A) (by C-G formula based on SCr of 1.62 mg/dL (H)).    No Known Allergies  Antimicrobials this admission:  Cefepime 9/25 >>  Vancomycin 9/25 >> 10/4  Dose adjustments this admission: 9/29 VT 27>>change to 1g q24h  Microbiology results: 9/25 Influenza panel negative  9/25 HIV non-reactive  9/25 blood x 2 - NGTD  9/25 urine - multiple species, suggest recollection  9/26 right pleural fluid - no organisms on gram stain, NGTD  9/?? MRSA PCR - pending  9/25 Strep pneuma Ag - negative  9/25 Legionella Ag - neg  Pollyann Samples, PharmD, BCPS 05/12/2017, 8:19 AM

## 2017-05-12 NOTE — Progress Notes (Signed)
PROGRESS NOTE    Rhonda Haley  ZOX:[id number] DOB: 08-21-1965 DOA: 04/21/2017 PCP: Lewis Moccasin, MD   Chief Complaint  Patient presents with  . Fever  . Shortness of Breath    Brief Narrative:  HPI On 04/16/2017 by Ms. Junious Silk, NP Rhonda Haley is a 51 y.o. female with medical history significant for alcoholic cirrhosis with associated ascites, recurrent right hepatic hydrothorax, chronic hyponatremia, protein calorie malnutrition and anemia. Patient had been started on low-dose prednisone (03/21/17) for acute alcoholic hepatitis in the setting of weakly positive autoimmune markers. Over the past 2 weeks prednisone has been tapered to off with last dosage on 9/24. Patient has had asymptomatic leukocytosis dating back to 8/21 where her white count was documented at 23,300 with repeat CBC on 9/12 white count 29,900. This morning patient awakened with generalized malaise, weakness, chills and subjective fevers. She is not having any abdominal pain, nausea vomiting or diarrhea. She is not had any coughing or sick contacts. She followed up with the GI office she was noted to have a low-grade temperature of 100.4, heart rate was 140, BP was 90/56. Because of the symptoms she was sent to the ER for further evaluation. In the ER she continued with low-grade temperature and tachycardia as well as tachypnea with a low but normal blood pressure readings. Her white count was 20,700 with left shift as previous. Her lactic acid was 2.15. Her sodium was 122. Abdominal exam was benign. Chest x-ray revealed significant right pleural effusion and right basilar opacity. EDP is concerned over possible HCAP and requests evaluation for observation admission. She is not hypoxic at rest. She does report dyspnea on exertion which is chronic.  Interim history Admitted for sepsis secondary to HCAP. She subsequently underwent emergent Thoracentesis and was transferred to SDU. She is improved and GI was consulted for  further evaluation and Recc's. PCCM following for her Respiratory Failure. Patient was started back on Diuretics last night. Felt a little SOB this AM and repeat CXR did not show any worsening of Pleural Effusion. Started Patient on IV Steroids with Solumedrol 40 mg q12h on 05/04/17 and patient improving. Repeat Thoracentesis done by Dr. Grace Isaac and patient had an ex-vacuo pneumothorax after but remained stable.   Overnight the patient appeared to be withdrawing from EtOH so she was placed on CIWA protocol and received Ativan early this AM. The Pneumothorax appeared to get larger today on repeat X-Ray so Dr. Grace Isaac of IR placed a CT Guided 10 French Drainage catheter into the Right Pleural Space yielding 550 cc of additional serous pleural fluid.   PT evaluated and feel as if she needs SNF. Gastroenterology adding Rifaximin for Encephalopathy and Current dose of Lactulose increased. GI also increased Aldactone to 125 mg Daily ad Lasix to 60 mg po Daily.  Assessment & Plan   Sepsis secondary to presumed healthcare associated pneumonia -Patient presented with fevers, chills, tachycardia, tachypnea and relative hypotension with leukocytosis -Patient has had persistent leukocytosis however likely also affected by steroids -Lactic acid mildly elevated -Chest x-ray showed large right pleural effusion, diffuse alveolar opacities in left lung worrisome for pneumonia or pulmonary edema -Blood cultures show no growth to date -Urine culture showed multiple species -Status post thoracentesis -Fluid culture showed no growth to date -Urine Legionella and strep pneumonia antigens negative -Respiratory viral panel influenza PCR unremarkable -Continue cefepime, discontinued vancomycin on 08/11/2016 -patient with worsening leukocytosis, WBC 17.3 today -repeat CXR 10/5: persistent patchy infiltrates in the left lung -will repeat UA,  not sure if UA will be accurate (patient does not complain of urinary symptoms,  however, per RN, patient has been incontinent at times) -patient still having several loose bowel movements even with reduction of lactulose- ?Cdiff  Acute hypoxic respiratory failure secondary to pneumonia, pleural effusion, hepatic hydrothorax -Weaned off of oxygen, currently on room air and maintaining O2 sats in the high 90s -PCCM consulted and appreciated -Continue treatment of underlying conditions  Right Pleural effusion secondary to hepatic hydrothorax -Status post right thoracentesis on 03/19/2017, yielding 2.6 L of clear yellow fluid -Status post thoracentesis on 05/03/2017, yielding 1.2 L of fluid removed -IV fluid discontinued, patient was given 1 dose of IV Lasix -Patient had reported chronic dyspnea on exertion -PCCM consulted and appreciated -Pleural effusion noted to be transudate in -Status post right thoracentesis 05/06/2017 by interventional radiology, Dr. Grace Isaac, 1.4 L removed -Postthoracentesis chest x-ray showed interval development of small presumably ex vacuo pneumothorax -Repeat chest x-ray 05/07/2017 showed continued increase in size of moderate sized right-sided pneumothorax, minimal amount of right to left mediastinal shift. -CT chest tube placement by IR, yielding 550 mL of serous fluid -Chest x-ray 05/08/2017 showed decreased size of a hydropneumothorax, evidence of mild right to left midline shift suggesting some tension -Chest x-ray 05/10/2017, shows right thoracostomy tubes, slightly less right-sided pleural effusion. Persistent right hydropneumothorax. Persistent patchy airspace density bilaterally, more extensive on the left than the right. -Chest tube drainage 850cc in the past 24 hours, 660cc today -Per IR note, chest tube was watersealed on 10/4- pending further recommendations   Decompensated alcoholic services of liver with ascites -Gastroenterology consulted and appreciated -Patient has been on Lasix and spironolactone dose adjustments-Aldactone 25 mg,  Lasix 60 mg -Mildly elevated LFTs Patient recently completed prednisone taper on 05/01/2017, per gastroenterology will not need further taper -Patient with minimal ascites and no abdominal pain-likely not SBP, paracentesis was canceled -Gastroenterology recommended continuing antibiotic therapy with protein supplements -Continue lactulose, rifaximin -lactulose frequency decreased to  daily, patient had 5 BM on 10/3 -Creatinine worsening today, with potassium of 5- may need to now hold spironolactone, however will await further recommendations from gastroenterology.   Alcohol abuse/withdrawal -Continue multivitamin, folic acid, thiamine, CIWA protocol   Chronic hyponatremia -Baseline sodium appears to be 124-125 -Diuretics adjusted as above -Sodium currently 124 -Continue to monitor CMP  Murmur -Patient with loud systolic murmur -Echocardiogram July 2018 shows normal systolic function, normal diastolic function without valvular maladies -As above, blood cultures show no growth to date -Patient will need follow-up with cardiology as an outpatient -Echocardiogram EF 70-75%, grade 1 diastolic dysfunction. Aortic valve seems thickened, mild outflow obstruction, no AI.  Anemia of chronic disease/IDA -Hemoglobin currently 9.1, baseline hemoglobin 9 -Continue iron supplementation -Monitor CBC  Severe Protein calorie malnutrition -Nutrition consult and appreciated, continue supplements  Thrombocytopenia, chronic  -Likely secondary to alcohol use -Platelets currently 120 -Continue to monitor CBC  Hyperbilirubinemia -Likely secondary to alcoholism and decompensated liver cirrhosis with ascites -Continue to monitor CMP  Acute kidney injury -Possibly secondary to hepatorenal syndrome or worsening of creatinine secondary to diuretics -Creatinine currently 1.62 -as above, creatinine worsening, may need to hold spironolactone or decrease lasix dose- pending further recommendations  from GI -Continue to monitor CMP  Hyperkalemia -Resolved -Patient was given Kayexalate, potassium currently 5 -Continue to monitor CMP  Deconditioning -PT, OT recommended SNF  DVT Prophylaxis  SCDs  Code Status: Full  Family Communication: None at bedside  Disposition Plan: Admitted, continue to monitor and stepdown.  Will likely  need SNF at discharge  Consultants PCCM Gastroenterology Interventional radiology  Procedures  Thoracentesis 05/03/2017, 1.2 L of fluid removed Echocardiogram Ultrasound-guided right thoracentesis yielding 1.4 L fluid removed on 05/06/2017 CT-guided cyst chest tube placed 05/07/2017 for pneumothorax  Antibiotics   Anti-infectives    Start     Dose/Rate Route Frequency Ordered Stop   05/12/17 1000  ceFEPIme (MAXIPIME) 2 g in dextrose 5 % 50 mL IVPB     2 g 100 mL/hr over 30 Minutes Intravenous Every 24 hours 05/12/17 0817     05/11/17 2200  ceFEPIme (MAXIPIME) 1 g in dextrose 5 % 50 mL IVPB  Status:  Discontinued     1 g 100 mL/hr over 30 Minutes Intravenous Every 12 hours 05/11/17 1101 05/12/17 0817   05/09/17 0800  ceFEPIme (MAXIPIME) 1 g in dextrose 5 % 50 mL IVPB  Status:  Discontinued     1 g 100 mL/hr over 30 Minutes Intravenous Every 12 hours 05/08/17 1543 05/11/17 1101   05/08/17 1500  rifaximin (XIFAXAN) tablet 550 mg     550 mg Oral 2 times daily 05/08/17 1417     05/06/17 2200  vancomycin (VANCOCIN) IVPB 1000 mg/200 mL premix  Status:  Discontinued     1,000 mg 200 mL/hr over 60 Minutes Intravenous Every 24 hours 05/06/17 1224 05/11/17 1101   04/08/2017 2200  ceFEPIme (MAXIPIME) 1 g in dextrose 5 % 50 mL IVPB  Status:  Discontinued     1 g 100 mL/hr over 30 Minutes Intravenous Every 8 hours 04/14/2017 2055 05/08/17 1543   04/18/2017 2130  vancomycin (VANCOCIN) IVPB 750 mg/150 ml premix  Status:  Discontinued     750 mg 150 mL/hr over 60 Minutes Intravenous Every 12 hours 04/18/2017 2102 05/06/17 1213   04/20/2017 1615  vancomycin  (VANCOCIN) IVPB 1000 mg/200 mL premix     1,000 mg 200 mL/hr over 60 Minutes Intravenous  Once 04/26/2017 1614 04/23/2017 1736   05/07/2017 1615  piperacillin-tazobactam (ZOSYN) IVPB 3.375 g     3.375 g 100 mL/hr over 30 Minutes Intravenous  Once 04/19/2017 1614 04/12/2017 1758      Subjective:   Kayliegh Boyers seen and examined today.  Patient has no complaints today. Denies chest pain, shortness of breath, abdominal pain, N/V/C. Still having several loose bowel movements.   Objective:   Vitals:   05/12/17 0000 05/12/17 0325 05/12/17 0400 05/12/17 0800  BP: (!) 86/44  (!) 89/49 (!) 90/49  Pulse: 89  90 86  Resp: (!) 31  (!) 26 (!) 23  Temp:   98.4 F (36.9 C) 97.9 F (36.6 C)  TempSrc:   Oral Oral  SpO2: 100%  97% 100%  Weight:  50.7 kg (111 lb 12.4 oz)    Height:        Intake/Output Summary (Last 24 hours) at 05/12/17 1050 Last data filed at 05/12/17 0800  Gross per 24 hour  Intake              460 ml  Output             1150 ml  Net             -690 ml   Filed Weights   05/10/17 0348 05/11/17 0500 05/12/17 0325  Weight: 50 kg (110 lb 3.7 oz) 52.5 kg (115 lb 11.9 oz) 50.7 kg (111 lb 12.4 oz)   Exam  General: Well developed, well nourished, NAD, appears stated age  HEENT: NCAT, mucous membranes moist.  Cardiovascular: S1 S2 auscultated, 3/6 SEM, RRR  Respiratory: Diminished breath sounds, right chest tube in place  Abdomen: Soft, nontender, nondistended, + bowel sounds  Extremities: warm dry without cyanosis clubbing or edema  Neuro: AAOx3, nonfocal   Psych: Appropriate  Data Reviewed: I have personally reviewed following labs and imaging studies  CBC:  Recent Labs Lab 05/06/17 0608 05/07/17 0924 05/08/17 1023 05/09/17 0218 05/10/17 0225 05/11/17 0241 05/12/17 0323  WBC 20.7* 14.1* 13.3* 17.0* 11.6* 14.8* 17.3*  NEUTROABS 18.3* 11.7* 10.5* 13.8* 9.2*  --   --   HGB 10.2* 11.4* 11.7* 9.2* 7.9* 8.8* 9.1*  HCT 29.2* 33.5* 34.3* 27.8* 23.2* 26.6* 27.5*  MCV  98.6 101.2* 101.5* 100.0 101.3* 100.8* 100.7*  PLT 112* 84* PLATELETS APPEAR DECREASED 135* 104* 122* 120*   Basic Metabolic Panel:  Recent Labs Lab 05/06/17 0608 05/08/17 1023 05/09/17 0218 05/10/17 0225 05/11/17 0241 05/12/17 0323  NA 126* 129* 124* 128* 123* 124*  K 4.9 5.8* 5.4* 3.8 3.9 5.0  CL 95* 100* 93* 93* 89* 88*  CO2 18* 18* GLUCOSE 127* 143* 114* 142* 103* 101*  BUN 35* 57* 53* 52* 56* 67*  CREATININE 0.95 1.15* 0.98 1.09* 1.25* 1.62*  CALCIUM 9.5 9.8 9.6 8.9 8.5* 8.6*  MG 2.2 2.6* 2.4 2.1  --   --   PHOS 3.8 6.0* 2.9 3.7  --   --    GFR: Estimated Creatinine Clearance: 32.1 mL/min (A) (by C-G formula based on SCr of 1.62 mg/dL (H)). Liver Function Tests:  Recent Labs Lab 05/06/17 0608 05/08/17 1023 05/09/17 0218 05/10/17 0225 05/12/17 0323  AST 69* 63* 58* 75* 82*  ALT 75* 74* 74* 82* 90*  ALKPHOS 114 106 109 101 107  BILITOT 7.1* 8.0* 6.0* 4.9* 5.7*  PROT 7.1 6.8 6.1* 5.5* 5.7*  ALBUMIN 2.0* 2.0* 1.8* 1.7* 1.8*   No results for input(s): LIPASE, AMYLASE in the last 168 hours.  Recent Labs Lab 05/09/17 0218  AMMONIA 49*   Coagulation Profile:  Recent Labs Lab 05/06/17 0608 05/12/17 0323  INR 1.55 1.52   Cardiac Enzymes: No results for input(s): CKTOTAL, CKMB, CKMBINDEX, TROPONINI in the last 168 hours. BNP (last 3 results) No results for input(s): PROBNP in the last 8760 hours. HbA1C: No results for input(s): HGBA1C in the last 72 hours. CBG: No results for input(s): GLUCAP in the last 168 hours. Lipid Profile: No results for input(s): CHOL, HDL, LDLCALC, TRIG, CHOLHDL, LDLDIRECT in the last 72 hours. Thyroid Function Tests: No results for input(s): TSH, T4TOTAL, FREET4, T3FREE, THYROIDAB in the last 72 hours. Anemia Panel: No results for input(s): VITAMINB12, FOLATE, FERRITIN, TIBC, IRON, RETICCTPCT in the last 72 hours. Urine analysis:    Component Value Date/Time   COLORURINE AMBER (A) 04/24/2017 1455    APPEARANCEUR HAZY (A) 05/07/2017 1455   LABSPEC 1.015 04/30/2017 1455   PHURINE 6.0 04/18/2017 1455   GLUCOSEU NEGATIVE 05/04/2017 1455   HGBUR SMALL (A) 04/14/2017 1455   BILIRUBINUR NEGATIVE 05/04/2017 1455   KETONESUR NEGATIVE 04/30/2017 1455   PROTEINUR NEGATIVE 04/15/2017 1455   NITRITE NEGATIVE 05/04/2017 1455   LEUKOCYTESUR TRACE (A) 04/22/2017 1455   Sepsis Labs: (procalcitonin:4,lacticidven:4)  ) Recent Results (from the past 240 hour(s))  Blood culture (routine x 2)     Status: None   Collection Time: 04/17/2017  2:20 PM  Result Value Ref Range Status   Specimen Description BLOOD LEFT ANTECUBITAL  Final   Special Requests   Final  BOTTLES DRAWN AEROBIC AND ANAEROBIC Blood Culture adequate volume   Culture NO GROWTH 5 DAYS  Final   Report Status 05/07/2017 FINAL  Final  Blood culture (routine x 2)     Status: None   Collection Time: 04/19/2017  2:25 PM  Result Value Ref Range Status   Specimen Description BLOOD LEFT HAND  Final   Special Requests IN PEDIATRIC BOTTLE Blood Culture adequate volume  Final   Culture NO GROWTH 5 DAYS  Final   Report Status 05/07/2017 FINAL  Final  Urine culture     Status: Abnormal   Collection Time: 04/11/2017  2:55 PM  Result Value Ref Range Status   Specimen Description URINE, CLEAN CATCH  Final   Special Requests NONE  Final   Culture MULTIPLE SPECIES PRESENT, SUGGEST RECOLLECTION (A)  Final   Report Status 05/04/2017 FINAL  Final  Respiratory Panel by PCR     Status: None   Collection Time: 05/01/2017 11:29 PM  Result Value Ref Range Status   Adenovirus NOT DETECTED NOT DETECTED Final   Coronavirus 229E NOT DETECTED NOT DETECTED Final   Coronavirus HKU1 NOT DETECTED NOT DETECTED Final   Coronavirus NL63 NOT DETECTED NOT DETECTED Final   Coronavirus OC43 NOT DETECTED NOT DETECTED Final   Metapneumovirus NOT DETECTED NOT DETECTED Final   Rhinovirus / Enterovirus NOT DETECTED NOT DETECTED Final   Influenza A NOT DETECTED NOT  DETECTED Final   Influenza B NOT DETECTED NOT DETECTED Final   Parainfluenza Virus 1 NOT DETECTED NOT DETECTED Final   Parainfluenza Virus 2 NOT DETECTED NOT DETECTED Final   Parainfluenza Virus 3 NOT DETECTED NOT DETECTED Final   Parainfluenza Virus 4 NOT DETECTED NOT DETECTED Final   Respiratory Syncytial Virus NOT DETECTED NOT DETECTED Final   Bordetella pertussis NOT DETECTED NOT DETECTED Final   Chlamydophila pneumoniae NOT DETECTED NOT DETECTED Final   Mycoplasma pneumoniae NOT DETECTED NOT DETECTED Final  Body fluid culture     Status: None   Collection Time: 05/03/17  2:10 AM  Result Value Ref Range Status   Specimen Description PLEURAL RIGHT  Final   Special Requests NONE  Final   Gram Stain NO WBC SEEN NO ORGANISMS SEEN   Final   Culture NO GROWTH 3 DAYS  Final   Report Status 05/06/2017 FINAL  Final  MRSA PCR Screening     Status: None   Collection Time: 05/09/17 11:57 AM  Result Value Ref Range Status   MRSA by PCR NEGATIVE NEGATIVE Final    Comment:        The GeneXpert MRSA Assay (FDA approved for NASAL specimens only), is one component of a comprehensive MRSA colonization surveillance program. It is not intended to diagnose MRSA infection nor to guide or monitor treatment for MRSA infections.       Radiology Studies: Dg Chest Port 1 View  Result Date: 05/12/2017 CLINICAL DATA:  Follow-up Right hydropneumothorax EXAM: PORTABLE CHEST 1 VIEW COMPARISON:  05/10/2017 FINDINGS: Cardiac shadow is stable. Right-sided chest tube is noted in satisfactory position. Persistent right pleural effusion is seen. A small right pneumothorax is again seen and stable. Lungs are well aerated. Patchy infiltrates remain in the left lung. IMPRESSION: Stable appearance of the chest when compared with the previous exam. Electronically Signed   By: Alcide Clever M.D.   On: 05/12/2017 07:21     Scheduled Meds: . escitalopram  20 mg Oral Daily  . feeding supplement (ENSURE ENLIVE)   237 mL Oral TID  BM  . ferrous sulfate  325 mg Oral BID WC  . folic acid  1 mg Oral Daily  . furosemide  60 mg Oral Daily  . lactulose  20 g Oral Daily  . mouth rinse  15 mL Mouth Rinse BID  . Melatonin  6 mg Oral QHS  . multivitamin with minerals  1 tablet Oral Daily  . pantoprazole  20 mg Oral QAC breakfast  . rifaximin  550 mg Oral BID  . sodium chloride flush  3 mL Intravenous Q12H  . spironolactone  125 mg Oral Daily  . thiamine injection  100 mg Intravenous Daily   Or  . thiamine  100 mg Oral Daily   Continuous Infusions: . ceFEPime (MAXIPIME) IV Stopped (05/12/17 1022)     LOS: 9 days   Time Spent in minutes   40 minutes  Kalis Friese D.O. on 05/12/2017 at 10:50 AM  Between 7am to 7pm - Pager - [phone]  After 7pm go to [email or web address] - password [email or web address]  And look for the night coverage person covering for me after hours  [facility] Office  [phone]

## 2017-05-12 NOTE — Progress Notes (Signed)
Referring Physician(s): Dr Nunzio Cory  Supervising Physician: Ruel Favors  Patient Status:  [facility] - In-pt  Chief Complaint:  Post 9/29 thoracentesis ptx Chest tube placed 9/30  Subjective:  Doing well Walking in halls with PT Chest tube water sealed yesterday at 12 noon No air leak noted  CXR FINDINGS: Cardiac shadow is stable. Right-sided chest tube is noted in satisfactory position. Persistent right pleural effusion is seen. A small right pneumothorax is again seen and stable. Lungs are well aerated. Patchy infiltrates remain in the left lung.  Discussed findings with Dr Miles Costain Advised to pull drain today  Allergies: Patient has no known allergies.  Medications: Prior to Admission medications   Medication Sig Start Date End Date Taking? Authorizing Provider  escitalopram (LEXAPRO) 20 MG tablet Take 20 mg by mouth daily. 01/18/17  Yes [provider]  feeding supplement, ENSURE ENLIVE, (ENSURE ENLIVE) LIQD Take 237 mLs by mouth 2 (two) times daily between meals. 01/21/17  Yes Ghimire, Werner Lean, MD  ferrous sulfate 325 (65 FE) MG tablet Take 1 tablet (325 mg total) by mouth 2 (two) times daily with a meal. 03/17/17  Yes Zehr, Shanda Bumps D, PA-C  furosemide (LASIX) 40 MG tablet Take 1.5 tablets (60 mg total) by mouth daily. 04/05/17  Yes Iva Boop, MD  Melatonin 10 MG TABS Take 10 mg by mouth at bedtime.   Yes [provider]  Multiple Vitamin (MULTIVITAMIN WITH MINERALS) TABS tablet Take 1 tablet by mouth daily. 03/21/17  Yes Calvert Cantor, MD  pantoprazole (PROTONIX) 20 MG tablet Take 1 tablet (20 mg total) by mouth daily before breakfast. 02/01/17  Yes Iva Boop, MD  potassium chloride SA (K-DUR,KLOR-CON) 20 MEQ tablet Take 1 tablet (20 mEq total) by mouth daily. 02/24/17 04/22/2017 Yes Beola Cord, MD  prednisoLONE (MILLIPRED) 5 MG TABS tablet PER LAST NOTE FROM 04/25/17 YOU SHOULD BE DOING ALREADY,Taper your prednisone as follows:   starting tomorrow for 4 Days then go to  for 4 days, then  for 4 days, then  for 4 days then STOP 04/24/17  Yes Iva Boop, MD  spironolactone (ALDACTONE) 100 MG tablet Take 2 tablets (200 mg total) by mouth daily. Patient taking differently: Take 250 mg by mouth daily.  04/20/17  Yes Iva Boop, MD  thiamine (VITAMIN B-1) 100 MG tablet Take 1 tablet (100 mg total) by mouth daily. 04/20/17  Yes Iva Boop, MD     Vital Signs: BP (!) 90/49 (BP Location: Left Arm)   Pulse 86   Temp 97.9 F (36.6 C) (Oral)   Resp (!) 23   Ht  (1.727 m)   Wt 111 lb 12.4 oz (50.7 kg)   SpO2 100%   BMI 17.00 kg/m   Physical Exam  Pulmonary/Chest: Effort normal and breath sounds normal.  Musculoskeletal: Normal range of motion.  Neurological: She is alert.  Skin: Skin is warm and dry.  Site of chest tube is clean and dry NT no bleeding No air leak at pleurvac OP significant daily CXR stable  Chest tube removal at bedside without complication vaseline dressing applied   Nursing note and vitals reviewed.   Imaging: Dg Chest 2 View  Result Date: 05/10/2017 CLINICAL DATA:  Shortness of breath. Cirrhosis. Ascites. Right chest tube. EXAM: CHEST  2 VIEW COMPARISON:  05/09/2017 FINDINGS: Right pigtail thoracostomy tube remains in place. Moderate right hydropneumothorax for cysts, possibly with somewhat less pleural fluid. Widespread airspace density remains, more extensive in  the left lung than the right. No pleural fluid seen on the left. IMPRESSION: Right thoracostomy tube remains in place. Slightly less right-sided pleural fluid. Persistent right hydropneumothorax. Persistent patchy airspace density bilaterally, more extensive on the left than the right. Electronically Signed   By: Paulina Fusi M.D.   On: 05/10/2017 08:14   Ct Chest Wo Contrast  Result Date: 05/10/2017 CLINICAL DATA:  Pleural effusion. Alcoholism and hepatic hydrothorax. Anemia. EXAM: CT CHEST WITHOUT  CONTRAST TECHNIQUE: Multidetector CT imaging of the chest was performed following the standard protocol without IV contrast. COMPARISON:  Chest x-ray 05/09/2017 FINDINGS: Cardiovascular: Heart is normal in size. Minimal calcified plaque of the thoracic aorta. Remaining vascular structures are unremarkable. Mediastinum/Nodes: Several small mediastinal lymph nodes likely reactive. No definite hilar adenopathy or axillary adenopathy. Remaining mediastinal structures are unremarkable. Lungs/Pleura: Lungs are adequately inflated and demonstrate a patchy bilateral airspace process left worse than right likely infection. Large right pleural effusion with associated basilar atelectasis. Small pneumothorax over the anterior mid to lower right thorax. Right-sided small caliber pigtail pleural drainage catheter in place. Airways are within normal. Upper Abdomen: Mild nodular contour to the left lobe of the liver. Musculoskeletal: Within normal. IMPRESSION: Bilateral patchy airspace process left worse than right likely infection. Large right pleural effusion with small anterior pneumothorax over the right mid to lower lung. Small caliber right-sided pigtail pleural drainage catheter in place. Mild right basilar atelectasis. Aortic Atherosclerosis (ICD10-I70.0). Electronically Signed   By: Elberta Fortis M.D.   On: 05/10/2017 00:47   Dg Chest Port 1 View  Result Date: 05/12/2017 CLINICAL DATA:  Follow-up Right hydropneumothorax EXAM: PORTABLE CHEST 1 VIEW COMPARISON:  05/10/2017 FINDINGS: Cardiac shadow is stable. Right-sided chest tube is noted in satisfactory position. Persistent right pleural effusion is seen. A small right pneumothorax is again seen and stable. Lungs are well aerated. Patchy infiltrates remain in the left lung. IMPRESSION: Stable appearance of the chest when compared with the previous exam. Electronically Signed   By: Alcide Clever M.D.   On: 05/12/2017 07:21   Dg Chest Port 1 View  Result Date:  05/09/2017 CLINICAL DATA:  Pleural effusion with chest tube in place EXAM: PORTABLE CHEST 1 VIEW COMPARISON:  May 08, 2017 FINDINGS: Chest tube remains on the right. There is a pneumothorax on the right which appears smaller compared to 1 day prior. There may be a slight tension component given slight shift of heart and mediastinum toward the left. This finding is stable. There is a fairly sizable partially loculated pleural effusion on the right. There is underlying interstitial and patchy alveolar edema bilaterally. No new opacity is evident. Heart is upper normal in size with a degree of pulmonary venous hypertension evident. No evident adenopathy. No bone lesions. IMPRESSION: Persistent chest tube on the right without change in position. The pneumothorax on the right appears smaller compared to 1 day prior. There may be mild tension component, stable. There remains sizable partially loculated pleural effusion on the right. There is a degree of underlying edema, likely due to underlying congestive heart failure. Patchy infiltrate on the left potentially could represent superimposed pneumonia. These changes are stable. The cardiac silhouette is stable. Electronically Signed   By: Bretta Bang III M.D.   On: 05/09/2017 07:46    Labs:  CBC:  Recent Labs  05/09/17 0218 05/10/17 0225 05/11/17 0241 05/12/17 0323  WBC 17.0* 11.6* 14.8* 17.3*  HGB 9.2* 7.9* 8.8* 9.1*  HCT 27.8* 23.2* 26.6* 27.5*  PLT 135* 104*  122* 120*    COAGS:  Recent Labs  05/03/17 0209 05/04/17 0811 05/06/17 0608 05/12/17 0323  INR 1.56 1.68 1.55 1.52  APTT 35  --   --   --     BMP:  Recent Labs  05/09/17 0218 05/10/17 0225 05/11/17 0241 05/12/17 0323  NA 124* 128* 123* 124*  K 5.4* 3.8 3.9 5.0  CL 93* 93* 89* 88*  CO2 GLUCOSE 114* 142* 103* 101*  BUN 53* 52* 56* 67*  CALCIUM 9.6 8.9 8.5* 8.6*  CREATININE 0.98 1.09* 1.25* 1.62*  GFRNONAA >60 57* 48* 35*  GFRAA >60 >60 56* 41*     LIVER FUNCTION TESTS:  Recent Labs  05/08/17 1023 05/09/17 0218 05/10/17 0225 05/12/17 0323  BILITOT 8.0* 6.0* 4.9* 5.7*  AST 63* 58* 75* 82*  ALT 74* 74* 82* 90*  ALKPHOS 106 109 101 107  PROT 6.8 6.1* 5.5* 5.7*  ALBUMIN 2.0* 1.8* 1.7* 1.8*    Assessment and Plan:  Chest tube placed 9/30 post ptx from thoracentesis 9/29 Doing well Water sealed CT yesterday Stable CXR Chest tube removal at bedside without complication CXR pending  Electronically Signed: Chancy Claros A, PA-C 05/12/2017, 11:53 AM   I spent a total of 25 Minutes at the the patient's bedside AND on the patient's hospital floor or unit, greater than 50% of which was counseling/coordinating care for chest tube removal

## 2017-05-12 NOTE — Progress Notes (Signed)
Physical Therapy Treatment Patient Details Name: Rhonda Haley MRN: [id number] DOB: 1966/06/04 Today's Date: 05/12/2017    History of Present Illness 51 y.o. female who presents with sepsis from HCAP in the setting of severe ETOH cirrhosis and recurrent pleural effusion    PT Comments    Pt admitted with above diagnosis. Pt currently with functional limitations due to balance and endurance deficits. Pt was able to ambulate with min assist with RW and incr distance today.  Pt very happy at how well she did.   Pt will benefit from skilled PT to increase their independence and safety with mobility to allow discharge to the venue listed below.     Follow Up Recommendations  Supervision/Assistance - 24 hour;SNF     Equipment Recommendations  None recommended by PT    Recommendations for Other Services OT consult     Precautions / Restrictions Precautions Precautions: Fall Restrictions Weight Bearing Restrictions: No    Mobility  Bed Mobility Overal bed mobility: Needs Assistance Bed Mobility: Supine to Sit     Supine to sit: Min guard     General bed mobility comments: pt moves very slowly.  Requires increased time and rest breaks to achieve EOB sitting   Transfers Overall transfer level: Needs assistance Equipment used: Rolling walker (2 wheeled) Transfers: Sit to/from [employer] Sit to Stand: Min assist         General transfer comment: assist for balance and mod cues for walker safety   Ambulation/Gait Ambulation/Gait assistance: Min assist;+2 safety/equipment Ambulation Distance (Feet): 150 Feet (75 x 2) Assistive device: Rolling walker (2 wheeled) Gait Pattern/deviations: Step-through pattern;Decreased stride length;Trunk flexed   Gait velocity interpretation: Below normal speed for age/gender General Gait Details: cues for position in RW and breathing technique. Assist to move RW due to heavy chest tube hanging on it.  Pt got better pushign RW  the longer she walked. Had one seated rest break.  Pt followed with chair.    Stairs            Wheelchair Mobility    Modified Rankin (Stroke Patients Only)       Balance Overall balance assessment: Needs assistance Sitting-balance support: No upper extremity supported;Feet supported;Bilateral upper extremity supported Sitting balance-Leahy Scale: Fair     Standing balance support: Bilateral upper extremity supported;During functional activity Standing balance-Leahy Scale: Poor Standing balance comment: requires min a and bil. UE support                             Cognition Arousal/Alertness: Awake/alert Behavior During Therapy: Flat affect;WFL for tasks assessed/performed Overall Cognitive Status: Impaired/Different from baseline Area of Impairment: Orientation;Attention;Memory;Following commands;Safety/judgement;Awareness;Problem solving                 Orientation Level: Disoriented to;Time Current Attention Level: Selective Memory: Decreased short-term memory Following Commands: Follows one step commands consistently;Follows multi-step commands inconsistently Safety/Judgement: Decreased awareness of safety;Decreased awareness of deficits Awareness: Intellectual          Exercises      General Comments        Pertinent Vitals/Pain Pain Assessment: No/denies pain    Home Living                      Prior Function            PT Goals (current goals can now be found in the care plan section) Progress towards PT goals:  Progressing toward goals    Frequency    Min 3X/week      PT Plan Current plan remains appropriate    Co-evaluation              AM-PAC PT "6 Clicks" Daily Activity  Outcome Measure  Difficulty turning over in bed (including adjusting bedclothes, sheets and blankets)?: A Lot Difficulty moving from lying on back to sitting on the side of the bed? : A Lot Difficulty sitting down on and  standing up from a chair with arms (e.g., wheelchair, bedside commode, etc,.)?: A Lot Help needed moving to and from a bed to chair (including a wheelchair)?: A Lot Help needed walking in hospital room?: A Little Help needed climbing 3-5 steps with a railing? : Total 6 Click Score: 12    End of Session Equipment Utilized During Treatment: Gait belt Activity Tolerance: Patient limited by fatigue Patient left: in chair;with call bell/phone within reach;with chair alarm set (PA came in to pull chest tube) Nurse Communication: Mobility status PT Visit Diagnosis: Other abnormalities of gait and mobility (R26.89);Difficulty in walking, not elsewhere classified (R26.2);Muscle weakness (generalized) (M62.81)     Time: 1610-9604 PT Time Calculation (min) (ACUTE ONLY): 26 min  Charges:  $Gait Training: 23-37 mins                    G Codes:       Rhonda Haley,PT Acute Rehabilitation [phone] (pager)    Rhonda Haley 05/12/2017, 12:53 PM

## 2017-05-12 NOTE — Clinical Social Work Placement (Signed)
   CLINICAL SOCIAL WORK PLACEMENT  NOTE  Date:  05/12/2017  Patient Details  Name: Marguita Venning MRN: [id number] Date of Birth: Jul 18, 1966  Clinical Social Work is seeking post-discharge placement for this patient at the Skilled  Nursing Facility level of care (*CSW will initial, date and re-position this form in  chart as items are completed):  Yes   Patient/family provided with [place] Clinical Social Work Department's list of facilities offering this level of care within the geographic area requested by the patient (or if unable, by the patient's family).  Yes   Patient/family informed of their freedom to choose among providers that offer the needed level of care, that participate in Medicare, Medicaid or managed care program needed by the patient, have an available bed and are willing to accept the patient.  Yes   Patient/family informed of [place]'s ownership interest in [facility] and [facility], as well as of the fact that they are under no obligation to receive care at these facilities.  PASRR submitted to EDS on 05/12/17     PASRR number received on 05/12/17     Existing PASRR number confirmed on       FL2 transmitted to all facilities in geographic area requested by pt/family on 05/12/17     FL2 transmitted to all facilities within larger geographic area on       Patient informed that his/her managed care company has contracts with or will negotiate with certain facilities, including the following:            Patient/family informed of bed offers received.  Patient chooses bed at       Physician recommends and patient chooses bed at      Patient to be transferred to   on  .  Patient to be transferred to facility by       Patient family notified on   of transfer.  Name of family member notified:        PHYSICIAN Please sign FL2     Additional Comment:    _______________________________________________ Margarito Liner, LCSW 05/12/2017, 4:02  PM

## 2017-05-12 NOTE — Progress Notes (Signed)
Patient ID: Rhonda Haley, female   DOB: [birth year], 51 y.o.   MRN: [id number]   CXR post removal of chest tube drain: IMPRESSION: Interval removal of right pigtail chest tube.  Right hydropneumothorax with small to moderate pneumothorax component, slightly increased. Attention on follow-up is suggested.  Otherwise unchanged.  Pt is asymptomatic Up in chair  02 sat 100 % RA Denies pain or SOB  Discussed cxr findings with Dr Loreta Ave Will order cxr for am If pt were to get SOB or develop CP Please get stat CXR

## 2017-05-12 NOTE — Consult Note (Signed)
PULMONARY / CRITICAL CARE MEDICINE   Name: Carlita Whitcomb MRN: [id number] DOB: 07/30/1966    ADMISSION DATE:  05/03/2017 CONSULTATION DATE:05/09/2017  REFERRING MD:  Russella Dar  CHIEF COMPLAINT:  Fever, Chills  HISTORY OF PRESENT ILLNESS:   51 year old with chronic liver disease who presented with fevers chills and hypotension. She had a significant white count with a left shift and she was started on empiric therapy for healthcare associated pneumonia with a combination of vancomycin and cefepime. She has a known history of prior hepatic pleural effusion. She had a very large right pleural effusion on this admission which was drained with a pigtail catheter on September 30. That fluid has shown no growth and had a LDH of only 34 a pH of 8.1 and only 24 white blood cells. The pigtail catheter was removed today. CT with the pigtail in place showed a right hydropneumothorax and no loculation along with diffuse pulmonary infiltrates. Today she has no complaint of dyspnea. She has a minimally productive cough. SpO2 is 100% on room air.  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  PAST SURGICAL HISTORY: She  has a past surgical history that includes Fertility Surgery (2003); Wisdom tooth extraction; IR THORACENTESIS ASP PLEURAL SPACE W/IMG GUIDE (02/21/2017); Esophagogastroduodenoscopy (01/2017); Colonoscopy (01/2017); Refractive surgery (Bilateral, ~ 2012); and Eye surgery.  No Known Allergies  No current facility-administered medications on file prior to encounter.    Current Outpatient Prescriptions on File Prior to Encounter  Medication Sig  . escitalopram (LEXAPRO) 20 MG tablet Take 20 mg by mouth daily.  . feeding  supplement, ENSURE ENLIVE, (ENSURE ENLIVE) LIQD Take 237 mLs by mouth 2 (two) times daily between meals.  . ferrous sulfate 325 (65 FE) MG tablet Take 1 tablet (325 mg total) by mouth 2 (two) times daily with a meal.  . furosemide (LASIX) 40 MG tablet Take 1.5 tablets (60 mg total) by mouth daily.  . Melatonin 10 MG TABS Take 10 mg by mouth at bedtime.  . Multiple Vitamin (MULTIVITAMIN WITH MINERALS) TABS tablet Take 1 tablet by mouth daily.  . pantoprazole (PROTONIX) 20 MG tablet Take 1 tablet (20 mg total) by mouth daily before breakfast.  . potassium chloride SA (K-DUR,KLOR-CON) 20 MEQ tablet Take 1 tablet (20 mEq total) by mouth daily.  . prednisoLONE (MILLIPRED) 5 MG TABS tablet PER LAST NOTE FROM 04/25/17 YOU SHOULD BE DOING ALREADY,Taper your prednisone as follows:  starting tomorrow for 4 Days then go to  for 4 days, then  for 4 days, then  for 4 days then STOP  . spironolactone (ALDACTONE) 100 MG tablet Take 2 tablets (200 mg total) by mouth daily. (Patient taking differently: Take 250 mg by mouth daily. )  . thiamine (VITAMIN B-1) 100 MG tablet Take 1 tablet (100 mg total) by mouth daily.    FAMILY HISTORY:  Her indicated that the status of her mother is unknown.    SOCIAL HISTORY: She  reports that she has been smoking Cigarettes.  She has a 8.25 pack-year smoking history. She has never used smokeless tobacco. She reports that she drinks alcohol. She reports that she does not use drugs.  REVIEW OF SYSTEMS:   Other than as noted is noncontributory  VITAL SIGNS: BP (!) 90/49 (BP Location: Left Arm)  Pulse 86   Temp (!) 97.1 F (36.2 C) (Oral)   Resp (!) 23   Ht  (1.727 m)   Wt 111 lb 12.4 oz (50.7 kg)   SpO2 100%   BMI 17.00 kg/m       INTAKE / OUTPUT: I/O last 3 completed shifts: In: 710 [P.O.:360; IV Piggyback:350] Out: 1140 [Urine:250; Chest Tube:890]  PHYSICAL EXAMINATION: General: This is a thin not quite cachectic female in no acute  distress. She is entirely appropriately conversant. Cardiovascular: The PMI is impressively hyperdynamic. S1 and S2 are regular with a 3/6 systolic ejection murmur. Lungs:  Respirations are unlabored. There is symmetric air movement and percussion. Despite the CXR appearance, there are only some scattered rhonchi, and no wheezes. Abdomen:  Slightly distended and tympanic. I don't appreciate any fluid wave. I cannot appreciate the liver edge or spleen. She is slightly icteric and has multiple spiders, as well as liver palms without asterixis.   LABS:  BMET  Recent Labs Lab 05/10/17 0225 05/11/17 0241 05/12/17 0323  NA 128* 123* 124*  K 3.8 3.9 5.0  CL 93* 89* 88*  CO2 BUN 52* 56* 67*  CREATININE 1.09* 1.25* 1.62*  GLUCOSE 142* 103* 101*    Electrolytes  Recent Labs Lab 05/08/17 1023 05/09/17 0218 05/10/17 0225 05/11/17 0241 05/12/17 0323  CALCIUM 9.8 9.6 8.9 8.5* 8.6*  MG 2.6* 2.4 2.1  --   --   PHOS 6.0* 2.9 3.7  --   --     CBC  Recent Labs Lab 05/10/17 0225 05/11/17 0241 05/12/17 0323  WBC 11.6* 14.8* 17.3*  HGB 7.9* 8.8* 9.1*  HCT 23.2* 26.6* 27.5*  PLT 104* 122* 120*    Coag's  Recent Labs Lab 05/06/17 0608 05/12/17 0323  INR 1.55 1.52    Sepsis Markers  Recent Labs Lab 05/06/17 0608  PROCALCITON 0.75    ABG No results for input(s): PHART, PCO2ART, PO2ART in the last 168 hours.  Liver Enzymes  Recent Labs Lab 05/09/17 0218 05/10/17 0225 05/12/17 0323  AST 58* 75* 82*  ALT 74* 82* 90*  ALKPHOS 109 101 107  BILITOT 6.0* 4.9* 5.7*  ALBUMIN 1.8* 1.7* 1.8*    Cardiac Enzymes No results for input(s): TROPONINI, PROBNP in the last 168 hours.  Glucose No results for input(s): GLUCAP in the last 168 hours.  Imaging Dg Chest Port 1 View  Result Date: 05/12/2017 CLINICAL DATA:  Pleural effusion, right chest tube removal EXAM: PORTABLE CHEST 1 VIEW COMPARISON:  05/12/2017 at 0612 hours FINDINGS: Interval removal of right  pigtail chest tube. Right hydropneumothorax with small to moderate right pneumothorax component and moderate layering pleural effusion. When compared to the prior, the pneumothorax component has slightly increased. Multifocal patchy opacities in the left lung. Leftward cardiomediastinal shift, unchanged. IMPRESSION: Interval removal of right pigtail chest tube. Right hydropneumothorax with small to moderate pneumothorax component, slightly increased. Attention on follow-up is suggested. Otherwise unchanged. These results will be called to the ordering clinician or representative by the Radiologist Assistant, and communication documented in the PACS or zVision Dashboard. Electronically Signed   By: Charline Bills M.D.   On: 05/12/2017 12:31   Dg Chest Port 1 View  Result Date: 05/12/2017 CLINICAL DATA:  Follow-up Right hydropneumothorax EXAM: PORTABLE CHEST 1 VIEW COMPARISON:  05/10/2017 FINDINGS: Cardiac shadow is stable. Right-sided chest tube is noted in satisfactory position. Persistent right pleural effusion is seen. A small right pneumothorax is again seen and stable. Lungs  are well aerated. Patchy infiltrates remain in the left lung. IMPRESSION: Stable appearance of the chest when compared with the previous exam. Electronically Signed   By: Alcide Clever M.D.   On: 05/12/2017 07:21       CULTURES: No positive cultures  ANTIBIOTICS: Vanco and Cefepime  SIGNIFICANT EVENTS: Pigtail placement 9/30   DISCUSSION: This is a 51 year old with a very large chronic right sided pleural effusion which on this admission proves to be a transudate. I suspect that this is a hepatic effusion and that mechanical efforts to control it more likely to be fraught with complications than success. In this case, drainage is much like a large volume paracentesis with a subsequent pre renal state. I have ordered some albumin to see if her creatinine will stabilize. Her HCAP appears to have responded to emperic  antibiotics. We will not routinely follow, please reconsult [place] as needed.  Penny Pia, MD [facility] Pager: [phone]  05/12/2017, 12:46 PM

## 2017-05-13 ENCOUNTER — Inpatient Hospital Stay (HOSPITAL_COMMUNITY): Payer: 59

## 2017-05-13 LAB — BASIC METABOLIC PANEL
Anion gap: 12 (ref 5–15)
BUN: 64 mg/dL — AB (ref 6–20)
CALCIUM: 8.6 mg/dL — AB (ref 8.9–10.3)
CO2: 21 mmol/L — AB (ref 22–32)
Chloride: 90 mmol/L — ABNORMAL LOW (ref 101–111)
Creatinine, Ser: 1.44 mg/dL — ABNORMAL HIGH (ref 0.44–1.00)
GFR calc Af Amer: 47 mL/min — ABNORMAL LOW (ref >60)
GFR, EST NON AFRICAN AMERICAN: 41 mL/min — AB (ref >60)
Glucose, Bld: 79 mg/dL (ref 65–99)
Potassium: 4.2 mmol/L (ref 3.5–5.1)
Sodium: 123 mmol/L — ABNORMAL LOW (ref 135–145)

## 2017-05-13 LAB — URINALYSIS, ROUTINE W REFLEX MICROSCOPIC
BILIRUBIN URINE: NEGATIVE
GLUCOSE, UA: NEGATIVE mg/dL
HGB URINE DIPSTICK: NEGATIVE
Ketones, ur: NEGATIVE mg/dL
Leukocytes, UA: NEGATIVE
Nitrite: NEGATIVE
PH: 5 (ref 5.0–8.0)
Protein, ur: NEGATIVE mg/dL
SPECIFIC GRAVITY, URINE: 1.011 (ref 1.005–1.030)

## 2017-05-13 LAB — CBC
HEMATOCRIT: 24.5 % — AB (ref 36.0–46.0)
Hemoglobin: 8.2 g/dL — ABNORMAL LOW (ref 12.0–15.0)
MCH: 33.6 pg (ref 26.0–34.0)
MCHC: 33.5 g/dL (ref 30.0–36.0)
MCV: 100.4 fL — ABNORMAL HIGH (ref 78.0–100.0)
PLATELETS: 125 10*3/uL — AB (ref 150–400)
RBC: 2.44 MIL/uL — ABNORMAL LOW (ref 3.87–5.11)
RDW: 14.9 % (ref 11.5–15.5)
WBC: 16.7 10*3/uL — ABNORMAL HIGH (ref 4.0–10.5)

## 2017-05-13 LAB — PROCALCITONIN: Procalcitonin: 0.56 ng/mL

## 2017-05-13 LAB — C DIFFICILE QUICK SCREEN W PCR REFLEX
C Diff antigen: NEGATIVE
C Diff interpretation: NOT DETECTED
C Diff toxin: NEGATIVE

## 2017-05-13 MED ORDER — LORAZEPAM 0.5 MG PO TABS
0.5000 mg | ORAL_TABLET | Freq: Four times a day (QID) | ORAL | Status: DC | PRN
  Administered 2017-05-13 – 2017-05-14 (×3): 0.5 mg via ORAL
  Filled 2017-05-13 (×3): qty 1

## 2017-05-13 NOTE — Progress Notes (Signed)
Patient ID: Rhonda Haley, female   DOB: 1966/07/14, 51 y.o.   MRN: [id number]    Referring Physician(s): Nunzio Cory  Supervising Physician: Malachy Moan  Patient Status:  [facility] - In-pt  Chief Complaint:  Post 9/29 thoracentesis ptx Chest tube placed 9/30  Subjective: Pt doing ok; does have some dyspnea with exertion, chest discomfort with cough; no worsening per pt   Allergies: Patient has no known allergies.  Medications: Prior to Admission medications   Medication Sig Start Date End Date Taking? Authorizing Provider  escitalopram (LEXAPRO) 20 MG tablet Take 20 mg by mouth daily. 01/18/17  Yes [provider]  feeding supplement, ENSURE ENLIVE, (ENSURE ENLIVE) LIQD Take 237 mLs by mouth 2 (two) times daily between meals. 01/21/17  Yes Ghimire, Werner Lean, MD  ferrous sulfate 325 (65 FE) MG tablet Take 1 tablet (325 mg total) by mouth 2 (two) times daily with a meal. 03/17/17  Yes Zehr, Shanda Bumps D, PA-C  furosemide (LASIX) 40 MG tablet Take 1.5 tablets (60 mg total) by mouth daily. 04/05/17  Yes Iva Boop, MD  Melatonin 10 MG TABS Take 10 mg by mouth at bedtime.   Yes [provider]  Multiple Vitamin (MULTIVITAMIN WITH MINERALS) TABS tablet Take 1 tablet by mouth daily. 03/21/17  Yes Calvert Cantor, MD  pantoprazole (PROTONIX) 20 MG tablet Take 1 tablet (20 mg total) by mouth daily before breakfast. 02/01/17  Yes Iva Boop, MD  potassium chloride SA (K-DUR,KLOR-CON) 20 MEQ tablet Take 1 tablet (20 mEq total) by mouth daily. 02/24/17 04/22/2017 Yes Beola Cord, MD  prednisoLONE (MILLIPRED) 5 MG TABS tablet PER LAST NOTE FROM 04/25/17 YOU SHOULD BE DOING ALREADY,Taper your prednisone as follows:  starting tomorrow for 4 Days then go to  for 4 days, then  for 4 days, then  for 4 days then STOP 04/24/17  Yes Iva Boop, MD  spironolactone (ALDACTONE) 100 MG tablet Take 2 tablets (200 mg total) by mouth daily. Patient taking differently: Take 250  mg by mouth daily.  04/20/17  Yes Iva Boop, MD  thiamine (VITAMIN B-1) 100 MG tablet Take 1 tablet (100 mg total) by mouth daily. 04/20/17  Yes Iva Boop, MD     Vital Signs: BP (!) 89/45 (BP Location: Left Arm)   Pulse 98   Temp 97.7 F (36.5 C) (Oral)   Resp (!) 24   Ht  (1.727 m)   Wt 111 lb 12.4 oz (50.7 kg)   SpO2 98%   BMI 17.00 kg/m   Physical Exam awake/alert; previous rt chest tube site ok, mildly tender  Imaging: Dg Chest 2 View  Result Date: 05/10/2017 CLINICAL DATA:  Shortness of breath. Cirrhosis. Ascites. Right chest tube. EXAM: CHEST  2 VIEW COMPARISON:  05/09/2017 FINDINGS: Right pigtail thoracostomy tube remains in place. Moderate right hydropneumothorax for cysts, possibly with somewhat less pleural fluid. Widespread airspace density remains, more extensive in the left lung than the right. No pleural fluid seen on the left. IMPRESSION: Right thoracostomy tube remains in place. Slightly less right-sided pleural fluid. Persistent right hydropneumothorax. Persistent patchy airspace density bilaterally, more extensive on the left than the right. Electronically Signed   By: Paulina Fusi M.D.   On: 05/10/2017 08:14   Ct Chest Wo Contrast  Result Date: 05/10/2017 CLINICAL DATA:  Pleural effusion. Alcoholism and hepatic hydrothorax. Anemia. EXAM: CT CHEST WITHOUT CONTRAST TECHNIQUE: Multidetector CT imaging of the chest was performed following the standard protocol without IV contrast. COMPARISON:  Chest x-ray 05/09/2017 FINDINGS: Cardiovascular: Heart is normal in size. Minimal calcified plaque of the thoracic aorta. Remaining vascular structures are unremarkable. Mediastinum/Nodes: Several small mediastinal lymph nodes likely reactive. No definite hilar adenopathy or axillary adenopathy. Remaining mediastinal structures are unremarkable. Lungs/Pleura: Lungs are adequately inflated and demonstrate a patchy bilateral airspace process left worse than right likely  infection. Large right pleural effusion with associated basilar atelectasis. Small pneumothorax over the anterior mid to lower right thorax. Right-sided small caliber pigtail pleural drainage catheter in place. Airways are within normal. Upper Abdomen: Mild nodular contour to the left lobe of the liver. Musculoskeletal: Within normal. IMPRESSION: Bilateral patchy airspace process left worse than right likely infection. Large right pleural effusion with small anterior pneumothorax over the right mid to lower lung. Small caliber right-sided pigtail pleural drainage catheter in place. Mild right basilar atelectasis. Aortic Atherosclerosis (ICD10-I70.0). Electronically Signed   By: Elberta Fortis M.D.   On: 05/10/2017 00:47   Dg Chest Port 1 View  Result Date: 05/13/2017 CLINICAL DATA:  Evaluate pneumothorax. EXAM: PORTABLE CHEST 1 VIEW COMPARISON:  05/12/2017 FINDINGS: The fluid and gas component of the right hydropneumothorax have not significantly changed in size. However, there is markedly increased subcutaneous gas in the right chest. In addition, there are increased at densities in the right hilum and central aspect of the right lung. Persistent parenchymal densities throughout the left lung are unchanged. Heart size is within normal limits and stable. The trachea is midline. IMPRESSION: Increased densities in the right hilum and central aspect of the right lung. Findings could represent worsening atelectasis or edema. No significant change in the size of the right hydropneumothorax. However, there is now a large amount of right chest subcutaneous gas. Persistent parenchymal disease throughout the left lung with minimal change. Electronically Signed   By: Richarda Overlie M.D.   On: 05/13/2017 08:12   Dg Chest Port 1 View  Result Date: 05/12/2017 CLINICAL DATA:  Pleural effusion, right chest tube removal EXAM: PORTABLE CHEST 1 VIEW COMPARISON:  05/12/2017 at 0612 hours FINDINGS: Interval removal of right pigtail  chest tube. Right hydropneumothorax with small to moderate right pneumothorax component and moderate layering pleural effusion. When compared to the prior, the pneumothorax component has slightly increased. Multifocal patchy opacities in the left lung. Leftward cardiomediastinal shift, unchanged. IMPRESSION: Interval removal of right pigtail chest tube. Right hydropneumothorax with small to moderate pneumothorax component, slightly increased. Attention on follow-up is suggested. Otherwise unchanged. These results will be called to the ordering clinician or representative by the Radiologist Assistant, and communication documented in the PACS or zVision Dashboard. Electronically Signed   By: Charline Bills M.D.   On: 05/12/2017 12:31   Dg Chest Port 1 View  Result Date: 05/12/2017 CLINICAL DATA:  Follow-up Right hydropneumothorax EXAM: PORTABLE CHEST 1 VIEW COMPARISON:  05/10/2017 FINDINGS: Cardiac shadow is stable. Right-sided chest tube is noted in satisfactory position. Persistent right pleural effusion is seen. A small right pneumothorax is again seen and stable. Lungs are well aerated. Patchy infiltrates remain in the left lung. IMPRESSION: Stable appearance of the chest when compared with the previous exam. Electronically Signed   By: Alcide Clever M.D.   On: 05/12/2017 07:21    Labs:  CBC:  Recent Labs  05/10/17 0225 05/11/17 0241 05/12/17 0323 05/13/17 0154  WBC 11.6* 14.8* 17.3* 16.7*  HGB 7.9* 8.8* 9.1* 8.2*  HCT 23.2* 26.6* 27.5* 24.5*  PLT 104* 122* 120* 125*    COAGS:  Recent Labs  05/03/17  1610 05/04/17 0811 05/06/17 0608 05/12/17 0323  INR 1.56 1.68 1.55 1.52  APTT 35  --   --   --     BMP:  Recent Labs  05/10/17 0225 05/11/17 0241 05/12/17 0323 05/13/17 0154  NA 128* 123* 124* 123*  K 3.8 3.9 5.0 4.2  CL 93* 89* 88* 90*  CO2 21*  GLUCOSE 142* 103* 101* 79  BUN 52* 56* 67* 64*  CALCIUM 8.9 8.5* 8.6* 8.6*  CREATININE 1.09* 1.25* 1.62* 1.44*    GFRNONAA 57* 48* 35* 41*  GFRAA >60 56* 41* 47*    LIVER FUNCTION TESTS:  Recent Labs  05/08/17 1023 05/09/17 0218 05/10/17 0225 05/12/17 0323  BILITOT 8.0* 6.0* 4.9* 5.7*  AST 63* 58* 75* 82*  ALT 74* 74* 82* 90*  ALKPHOS 106 109 101 107  PROT 6.8 6.1* 5.5* 5.7*  ALBUMIN 2.0* 1.8* 1.7* 1.8*    Assessment and Plan: Pt with hx right hepatic hydrothorax, rt thoracentesis 9/29 with ptx/chest tube placement 9/30: tube removed 10/5; CXR today with no sig change in rt hydroptx but does have some SQ air; c diff neg; creat 1.44(1.62), consider referral to IR clinic for TIPS discussion following discharge; check CXR in AM or sooner if pt's resp status worsens   Electronically Signed: D. Jeananne Rama, PA-C 05/13/2017, 9:53 AM   I spent a total of 15 minutes at the the patient's bedside AND on the patient's hospital floor or unit, greater than 50% of which was counseling/coordinating care for right pneumothorax/chest tube

## 2017-05-13 NOTE — Progress Notes (Signed)
Patient complaining of increased work of breathing with RR in the 30's-40's.  Pt. VSS 02 saturation at 100 percent.  Placed patient on 2L 02 to help decrease work of breathing.  PA Allred and Dr. Catha Gosselin updated on patients condition.

## 2017-05-13 NOTE — Progress Notes (Signed)
[facility] Progress Note  Chief Complaint:    Cirrhosis  Subjective: Feels okay, says she felt a little anxious earlier and became SOB. Feels better with 02.   Objective:  Vital signs in last 24 hours: Temp:  [97.7 F (36.5 C)-98.4 F (36.9 C)] 98.4 F (36.9 C) (10/06 1227) Pulse Rate:  [88-98] 98 (10/06 0351) Resp:  [24-28] 24 (10/06 0351) BP: (81-93)/(44-51) 89/45 (10/06 0351) SpO2:  [98 %-100 %] 98 % (10/06 0351) Weight:  [111 lb 12.4 oz (50.7 kg)] 111 lb 12.4 oz (50.7 kg) (10/06 6578) Last BM Date: 05/12/17 General:   Alert, thin white female in NAD EENT:  Normal hearing,  icteric sclera  Heart:  Regular rate and rhythm; + murmur, no lower extremity edema Pulm: Normal respiratory effort on 02 per [place]. Significantly diminished breath sounds RLL.  Abdomen:  Soft, moderately distended, nontender.  Normal bowel sounds Neurologic:  Alert and  oriented x4;  grossly normal neurologically. Psych:  Pleasant, cooperative.  Normal mood and affect.   Intake/Output from previous day: 10/05 0701 - 10/06 0700 In: 440 [P.O.:240; IV Piggyback:200] Out: 885 [Urine:225; Chest Tube:660] Intake/Output this shift: No intake/output data recorded.  Lab Results:  Recent Labs  05/11/17 0241 05/12/17 0323 05/13/17 0154  WBC 14.8* 17.3* 16.7*  HGB 8.8* 9.1* 8.2*  HCT 26.6* 27.5* 24.5*  PLT 122* 120* 125*   BMET  Recent Labs  05/11/17 0241 05/12/17 0323 05/13/17 0154  NA 123* 124* 123*  K 3.9 5.0 4.2  CL 89* 88* 90*  CO2 26 26 21*  GLUCOSE 103* 101* 79  BUN 56* 67* 64*  CREATININE 1.25* 1.62* 1.44*  CALCIUM 8.5* 8.6* 8.6*   LFT  Recent Labs  05/12/17 0323  PROT 5.7*  ALBUMIN 1.8*  AST 82*  ALT 90*  ALKPHOS 107  BILITOT 5.7*  BILIDIR 2.6*  IBILI 3.1*   PT/INR  Recent Labs  05/12/17 0323  LABPROT 18.2*  INR 1.52    Dg Chest Port 1 View  Result Date: 05/13/2017 CLINICAL DATA:  Evaluate pneumothorax. EXAM: PORTABLE CHEST 1 VIEW  COMPARISON:  05/12/2017 FINDINGS: The fluid and gas component of the right hydropneumothorax have not significantly changed in size. However, there is markedly increased subcutaneous gas in the right chest. In addition, there are increased at densities in the right hilum and central aspect of the right lung. Persistent parenchymal densities throughout the left lung are unchanged. Heart size is within normal limits and stable. The trachea is midline. IMPRESSION: Increased densities in the right hilum and central aspect of the right lung. Findings could represent worsening atelectasis or edema. No significant change in the size of the right hydropneumothorax. However, there is now a large amount of right chest subcutaneous gas. Persistent parenchymal disease throughout the left lung with minimal change. Electronically Signed   By: Richarda Overlie M.D.   On: 05/13/2017 08:12   ASSESSMENT / PLAN:   1. 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 Recently completed prednisolone for superimposed ETOH hepatitis.  Bili still lingering 5-6 range. She had a pneumothorax following thoracentesis this admission.  Chest tube removed yesterday. She iniitally did okay but this am had some dyspnea which has resolved with supplemental 02.  CXR this am shows increased densities in right hilum and central aspect of right lung. No significant change in size of right hydropneumothorax.   -continue lactulose and xifaxan for PSE, see #4.  -continue current dose of diuretics.  2. AKI, improving. Cr 1.44 <<< 1.62. On lasix 40 / aldactone 100  3. Macrocytic anemia, probably multifactorial (BM suppression, nutritional deficiencies with ETOH). Heme negative.   4. Loose stool. She describes 3-4 loose stools a day. On very low dose of lactulose.  -May need to stop it totally if having too many loose stools. Goal is 2-3 loose BMs a day. C-diff is negative. Could also have diarrhea from  antibiotics.   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 I agree with the Advanced Practitioner's note, impression and recommendations. Worsening dyspnea today, O2 resumed. Renal function improved-continue diuretics at current dosages and closely follow volume status, BMET. Consider adding midodrine to help improve renal perfusion, diuresis and ascites.   Claudette Head, MD [facility] [phone] Mon-Fri 8a-5p [phone] after 5p, weekends, holidays

## 2017-05-13 NOTE — Progress Notes (Signed)
PROGRESS NOTE    Rhonda Haley  ZOX:[id number] DOB: [birth year] DOA: 04/15/2017 PCP: Lewis Moccasin, MD   Chief Complaint  Patient presents with  . Fever  . Shortness of Breath    Brief Narrative:  HPI On 04/12/2017 by Ms. Junious Silk, NP Rhonda Haley is a 51 y.o. female with medical history significant for alcoholic cirrhosis with associated ascites, recurrent right hepatic hydrothorax, chronic hyponatremia, protein calorie malnutrition and anemia. Patient had been started on low-dose prednisone (03/21/17) for acute alcoholic hepatitis in the setting of weakly positive autoimmune markers. Over the past 2 weeks prednisone has been tapered to off with last dosage on 9/24. Patient has had asymptomatic leukocytosis dating back to 8/21 where her white count was documented at 23,300 with repeat CBC on 9/12 white count 29,900. This morning patient awakened with generalized malaise, weakness, chills and subjective fevers. She is not having any abdominal pain, nausea vomiting or diarrhea. She is not had any coughing or sick contacts. She followed up with the GI office she was noted to have a low-grade temperature of 100.4, heart rate was 140, BP was 90/56. Because of the symptoms she was sent to the ER for further evaluation. In the ER she continued with low-grade temperature and tachycardia as well as tachypnea with a low but normal blood pressure readings. Her white count was 20,700 with left shift as previous. Her lactic acid was 2.15. Her sodium was 122. Abdominal exam was benign. Chest x-ray revealed significant right pleural effusion and right basilar opacity. EDP is concerned over possible HCAP and requests evaluation for observation admission. She is not hypoxic at rest. She does report dyspnea on exertion which is chronic.  Interim history Admitted for sepsis secondary to HCAP. She subsequently underwent emergent Thoracentesis and was transferred to SDU. She is improved and GI was consulted for  further evaluation and Recc's. PCCM following for her Respiratory Failure. Patient was started back on Diuretics last night. Felt a little SOB this AM and repeat CXR did not show any worsening of Pleural Effusion. Started Patient on IV Steroids with Solumedrol 40 mg q12h on 05/04/17 and patient improving. Repeat Thoracentesis done by Dr. Grace Isaac and patient had an ex-vacuo pneumothorax after but remained stable.   Overnight the patient appeared to be withdrawing from EtOH so she was placed on CIWA protocol and received Ativan early this AM. The Pneumothorax appeared to get larger today on repeat X-Ray so Dr. Grace Isaac of IR placed a CT Guided 10 French Drainage catheter into the Right Pleural Space yielding 550 cc of additional serous pleural fluid.   PT evaluated and feel as if she needs SNF. Gastroenterology adding Rifaximin for Encephalopathy and Current dose of Lactulose increased. GI also decreased Aldactone to 100 mg Daily ad Lasix to 40 mg po Daily.  Assessment & Plan   Sepsis secondary to presumed healthcare associated pneumonia -Patient presented with fevers, chills, tachycardia, tachypnea and relative hypotension with leukocytosis -Patient has had persistent leukocytosis however likely also affected by steroids -Lactic acid mildly elevated -Chest x-ray showed large right pleural effusion, diffuse alveolar opacities in left lung worrisome for pneumonia or pulmonary edema -Blood cultures show no growth to date -Urine culture showed multiple species -Status post thoracentesis -Fluid culture showed no growth to date -Urine Legionella and strep pneumonia antigens negative -Respiratory viral panel influenza PCR unremarkable -Continue cefepime, discontinued vancomycin on 08/11/2016 -repeat CXR 10/5: persistent patchy infiltrates in the left lung -repeat UA unremarkable for infeciton -CDiff negative -Leukocytosis mildly improved  16.7  Acute hypoxic respiratory failure secondary to pneumonia,  pleural effusion, hepatic hydrothorax -Weaned off of oxygen, currently on room air and maintaining O2 sats in the high 90s -PCCM consulted and appreciated -Continue treatment of underlying conditions  Right Pleural effusion secondary to hepatic hydrothorax -Status post right thoracentesis on 03/19/2017, yielding 2.6 L of clear yellow fluid -Status post thoracentesis on 05/03/2017, yielding 1.2 L of fluid removed -IV fluid discontinued, patient was given 1 dose of IV Lasix -Patient had reported chronic dyspnea on exertion -PCCM consulted and appreciated -Pleural effusion noted to be transudate in -Status post right thoracentesis 05/06/2017 by interventional radiology, Dr. Grace Isaac, 1.4 L removed -Postthoracentesis chest x-ray showed interval development of small presumably ex vacuo pneumothorax -Repeat chest x-ray 05/07/2017 showed continued increase in size of moderate sized right-sided pneumothorax, minimal amount of right to left mediastinal shift. -CT chest tube placement by IR, yielding 550 mL of serous fluid -Chest x-ray 05/08/2017 showed decreased size of a hydropneumothorax, evidence of mild right to left midline shift suggesting some tension -Chest x-ray 05/10/2017, shows right thoracostomy tubes, slightly less right-sided pleural effusion. Persistent right hydropneumothorax. Persistent patchy airspace density bilaterally, more extensive on the left than the right. -chest tube removed on 05/12/2017, IR recommended outpatient follow up to discuss possible TIPS  Decompensated alcoholic services of liver with ascites -Gastroenterology consulted and appreciated -Mildly elevated LFTs Patient recently completed prednisone taper on 05/01/2017, per gastroenterology will not need further taper -Patient with minimal ascites and no abdominal pain-likely not SBP, paracentesis was canceled -Gastroenterology recommended continuing antibiotic therapy with protein supplements -Continue lactulose,  rifaximin -lactulose decreased to  daily  -improvement in the number of bowel movements, only one noted on 10/5 -Spironolactone reduced to  daily, lasix  daily   Alcohol abuse/withdrawal -Continue multivitamin, folic acid, thiamine, CIWA protocol   Chronic hyponatremia -Baseline sodium appears to be 124-125 -Diuretics adjusted as above -Sodium currently 123 -Continue to monitor CMP  Murmur -Patient with loud systolic murmur -Echocardiogram July 2018 shows normal systolic function, normal diastolic function without valvular maladies -As above, blood cultures show no growth to date -Patient will need follow-up with cardiology as an outpatient -Echocardiogram EF 70-75%, grade 1 diastolic dysfunction. Aortic valve seems thickened, mild outflow obstruction, no AI.  Anemia of chronic disease/IDA -Hemoglobin currently 8.2, baseline hemoglobin 9 -Continue iron supplementation -Monitor CBC  Severe Protein calorie malnutrition -Nutrition consult and appreciated, continue supplements  Thrombocytopenia, chronic  -Likely secondary to alcohol use -Platelets currently 125 -Continue to monitor CBC  Hyperbilirubinemia -Likely secondary to alcoholism and decompensated liver cirrhosis with ascites -Continue to monitor CMP  Acute kidney injury -Possibly secondary to hepatorenal syndrome or worsening of creatinine secondary to diuretics -Creatinine currently 1.44 with dose reduction of diuretics  -Continue to monitor CMP  Hyperkalemia -Resolved -Continue to monitor CMP  Deconditioning -PT, OT recommended SNF  DVT Prophylaxis  SCDs  Code Status: Full  Family Communication: None at bedside  Disposition Plan: Admitted, continue to monitor and stepdown.  Will likely need SNF at discharge- suspect in 1-2 days  Consultants PCCM Gastroenterology Interventional radiology  Procedures  Thoracentesis 05/03/2017, 1.2 L of fluid removed Echocardiogram Ultrasound-guided  right thoracentesis yielding 1.4 L fluid removed on 05/06/2017 CT-guided cyst chest tube placed 05/07/2017 for pneumothorax  Antibiotics   Anti-infectives    Start     Dose/Rate Route Frequency Ordered Stop   05/12/17 1000  ceFEPIme (MAXIPIME) 2 g in dextrose 5 % 50 mL IVPB     2 g 100  mL/hr over 30 Minutes Intravenous Every 24 hours 05/12/17 0817     05/11/17 2200  ceFEPIme (MAXIPIME) 1 g in dextrose 5 % 50 mL IVPB  Status:  Discontinued     1 g 100 mL/hr over 30 Minutes Intravenous Every 12 hours 05/11/17 1101 05/12/17 0817   05/09/17 0800  ceFEPIme (MAXIPIME) 1 g in dextrose 5 % 50 mL IVPB  Status:  Discontinued     1 g 100 mL/hr over 30 Minutes Intravenous Every 12 hours 05/08/17 1543 05/11/17 1101   05/08/17 1500  rifaximin (XIFAXAN) tablet 550 mg     550 mg Oral 2 times daily 05/08/17 1417     05/06/17 2200  vancomycin (VANCOCIN) IVPB 1000 mg/200 mL premix  Status:  Discontinued     1,000 mg 200 mL/hr over 60 Minutes Intravenous Every 24 hours 05/06/17 1224 05/11/17 1101   04/08/2017 2200  ceFEPIme (MAXIPIME) 1 g in dextrose 5 % 50 mL IVPB  Status:  Discontinued     1 g 100 mL/hr over 30 Minutes Intravenous Every 8 hours 05/06/2017 2055 05/08/17 1543   05/07/2017 2130  vancomycin (VANCOCIN) IVPB 750 mg/150 ml premix  Status:  Discontinued     750 mg 150 mL/hr over 60 Minutes Intravenous Every 12 hours 04/20/2017 2102 05/06/17 1213   04/10/2017 1615  vancomycin (VANCOCIN) IVPB 1000 mg/200 mL premix     1,000 mg 200 mL/hr over 60 Minutes Intravenous  Once 04/19/2017 1614 05/07/2017 1736   04/23/2017 1615  piperacillin-tazobactam (ZOSYN) IVPB 3.375 g     3.375 g 100 mL/hr over 30 Minutes Intravenous  Once 04/22/2017 1614 04/30/2017 1758      Subjective:   Rhonda Haley seen and examined today.  Patient has no complaints today. Denies chest pain, shortness of breath, abdominal pain, N/V/C. Feels the number of bowel movements have decreased.    Objective:   Vitals:   05/13/17 0000 05/13/17 0351  05/13/17 0611 05/13/17 0800  BP: (!) 91/47 (!) 89/45    Pulse: 92 98    Resp: (!) 27 (!) 24    Temp:  98 F (36.7 C)  97.7 F (36.5 C)  TempSrc:  Oral  Oral  SpO2: 99% 98%    Weight:   50.7 kg (111 lb 12.4 oz)   Height:        Intake/Output Summary (Last 24 hours) at 05/13/17 1135 Last data filed at 05/13/17 0611  Gross per 24 hour  Intake              390 ml  Output              225 ml  Net              165 ml   Filed Weights   05/11/17 0500 05/12/17 0325 05/13/17 0611  Weight: 52.5 kg (115 lb 11.9 oz) 50.7 kg (111 lb 12.4 oz) 50.7 kg (111 lb 12.4 oz)   Exam  General: Well developed, well nourished, NAD, appears stated age  HEENT: NCAT, mucous membranes moist.   Cardiovascular: S1 S2 auscultated, RRR, 3/6SEM.  Respiratory: Diminished but clear.   Abdomen: Soft, nontender, nondistended, + bowel sounds  Extremities: warm dry without cyanosis clubbing or edema  Neuro: AAOx3, nonfocal  Data Reviewed: I have personally reviewed following labs and imaging studies  CBC:  Recent Labs Lab 05/07/17 0924 05/08/17 1023 05/09/17 0218 05/10/17 0225 05/11/17 0241 05/12/17 0323 05/13/17 0154  WBC 14.1* 13.3* 17.0* 11.6* 14.8* 17.3* 16.7*  NEUTROABS 11.7* 10.5* 13.8* 9.2*  --   --   --   HGB 11.4* 11.7* 9.2* 7.9* 8.8* 9.1* 8.2*  HCT 33.5* 34.3* 27.8* 23.2* 26.6* 27.5* 24.5*  MCV 101.2* 101.5* 100.0 101.3* 100.8* 100.7* 100.4*  PLT 84* PLATELETS APPEAR DECREASED 135* 104* 122* 120* 125*   Basic Metabolic Panel:  Recent Labs Lab 05/08/17 1023 05/09/17 0218 05/10/17 0225 05/11/17 0241 05/12/17 0323 05/13/17 0154  NA 129* 124* 128* 123* 124* 123*  K 5.8* 5.4* 3.8 3.9 5.0 4.2  CL 100* 93* 93* 89* 88* 90*  CO2 18* 21*  GLUCOSE 143* 114* 142* 103* 101* 79  BUN 57* 53* 52* 56* 67* 64*  CREATININE 1.15* 0.98 1.09* 1.25* 1.62* 1.44*  CALCIUM 9.8 9.6 8.9 8.5* 8.6* 8.6*  MG 2.6* 2.4 2.1  --   --   --   PHOS 6.0* 2.9 3.7  --   --   --    GFR: Estimated  Creatinine Clearance: 36.2 mL/min (A) (by C-G formula based on SCr of 1.44 mg/dL (H)). Liver Function Tests:  Recent Labs Lab 05/08/17 1023 05/09/17 0218 05/10/17 0225 05/12/17 0323  AST 63* 58* 75* 82*  ALT 74* 74* 82* 90*  ALKPHOS 106 109 101 107  BILITOT 8.0* 6.0* 4.9* 5.7*  PROT 6.8 6.1* 5.5* 5.7*  ALBUMIN 2.0* 1.8* 1.7* 1.8*   No results for input(s): LIPASE, AMYLASE in the last 168 hours.  Recent Labs Lab 05/09/17 0218  AMMONIA 49*   Coagulation Profile:  Recent Labs Lab 05/12/17 0323  INR 1.52   Cardiac Enzymes: No results for input(s): CKTOTAL, CKMB, CKMBINDEX, TROPONINI in the last 168 hours. BNP (last 3 results) No results for input(s): PROBNP in the last 8760 hours. HbA1C: No results for input(s): HGBA1C in the last 72 hours. CBG: No results for input(s): GLUCAP in the last 168 hours. Lipid Profile: No results for input(s): CHOL, HDL, LDLCALC, TRIG, CHOLHDL, LDLDIRECT in the last 72 hours. Thyroid Function Tests: No results for input(s): TSH, T4TOTAL, FREET4, T3FREE, THYROIDAB in the last 72 hours. Anemia Panel: No results for input(s): VITAMINB12, FOLATE, FERRITIN, TIBC, IRON, RETICCTPCT in the last 72 hours. Urine analysis:    Component Value Date/Time   COLORURINE YELLOW 05/13/2017 0650   APPEARANCEUR CLEAR 05/13/2017 0650   LABSPEC 1.011 05/13/2017 0650   PHURINE 5.0 05/13/2017 0650   GLUCOSEU NEGATIVE 05/13/2017 0650   HGBUR NEGATIVE 05/13/2017 0650   BILIRUBINUR NEGATIVE 05/13/2017 0650   KETONESUR NEGATIVE 05/13/2017 0650   PROTEINUR NEGATIVE 05/13/2017 0650   NITRITE NEGATIVE 05/13/2017 0650   LEUKOCYTESUR NEGATIVE 05/13/2017 0650   Sepsis Labs: (procalcitonin:4,lacticidven:4)  ) Recent Results (from the past 240 hour(s))  MRSA PCR Screening     Status: None   Collection Time: 05/09/17 11:57 AM  Result Value Ref Range Status   MRSA by PCR NEGATIVE NEGATIVE Final    Comment:        The GeneXpert MRSA Assay (FDA approved  for NASAL specimens only), is one component of a comprehensive MRSA colonization surveillance program. It is not intended to diagnose MRSA infection nor to guide or monitor treatment for MRSA infections.   C difficile quick scan w PCR reflex     Status: None   Collection Time: 05/12/17  8:58 PM  Result Value Ref Range Status   C Diff antigen NEGATIVE NEGATIVE Final   C Diff toxin NEGATIVE NEGATIVE Final   C Diff interpretation No C. difficile detected.  Final  Radiology Studies: Dg Chest Port 1 View  Result Date: 05/13/2017 CLINICAL DATA:  Evaluate pneumothorax. EXAM: PORTABLE CHEST 1 VIEW COMPARISON:  05/12/2017 FINDINGS: The fluid and gas component of the right hydropneumothorax have not significantly changed in size. However, there is markedly increased subcutaneous gas in the right chest. In addition, there are increased at densities in the right hilum and central aspect of the right lung. Persistent parenchymal densities throughout the left lung are unchanged. Heart size is within normal limits and stable. The trachea is midline. IMPRESSION: Increased densities in the right hilum and central aspect of the right lung. Findings could represent worsening atelectasis or edema. No significant change in the size of the right hydropneumothorax. However, there is now a large amount of right chest subcutaneous gas. Persistent parenchymal disease throughout the left lung with minimal change. Electronically Signed   By: Richarda Overlie M.D.   On: 05/13/2017 08:12   Dg Chest Port 1 View  Result Date: 05/12/2017 CLINICAL DATA:  Pleural effusion, right chest tube removal EXAM: PORTABLE CHEST 1 VIEW COMPARISON:  05/12/2017 at 0612 hours FINDINGS: Interval removal of right pigtail chest tube. Right hydropneumothorax with small to moderate right pneumothorax component and moderate layering pleural effusion. When compared to the prior, the pneumothorax component has slightly increased. Multifocal patchy  opacities in the left lung. Leftward cardiomediastinal shift, unchanged. IMPRESSION: Interval removal of right pigtail chest tube. Right hydropneumothorax with small to moderate pneumothorax component, slightly increased. Attention on follow-up is suggested. Otherwise unchanged. These results will be called to the ordering clinician or representative by the Radiologist Assistant, and communication documented in the PACS or zVision Dashboard. Electronically Signed   By: Charline Bills M.D.   On: 05/12/2017 12:31   Dg Chest Port 1 View  Result Date: 05/12/2017 CLINICAL DATA:  Follow-up Right hydropneumothorax EXAM: PORTABLE CHEST 1 VIEW COMPARISON:  05/10/2017 FINDINGS: Cardiac shadow is stable. Right-sided chest tube is noted in satisfactory position. Persistent right pleural effusion is seen. A small right pneumothorax is again seen and stable. Lungs are well aerated. Patchy infiltrates remain in the left lung. IMPRESSION: Stable appearance of the chest when compared with the previous exam. Electronically Signed   By: Alcide Clever M.D.   On: 05/12/2017 07:21     Scheduled Meds: . escitalopram  20 mg Oral Daily  . feeding supplement (ENSURE ENLIVE)  237 mL Oral TID BM  . ferrous sulfate  325 mg Oral BID WC  . folic acid  1 mg Oral Daily  . furosemide  40 mg Oral Daily  . lactulose  10 g Oral Daily  . mouth rinse  15 mL Mouth Rinse BID  . Melatonin  6 mg Oral QHS  . multivitamin with minerals  1 tablet Oral Daily  . pantoprazole  20 mg Oral QAC breakfast  . rifaximin  550 mg Oral BID  . sodium chloride flush  3 mL Intravenous Q12H  . spironolactone  100 mg Oral Daily  . thiamine injection  100 mg Intravenous Daily   Or  . thiamine  100 mg Oral Daily   Continuous Infusions: . ceFEPime (MAXIPIME) IV Stopped (05/13/17 0946)     LOS: 10 days   Time Spent in minutes   40 minutes  Arlesia Kiel D.O. on 05/13/2017 at 11:35 AM  Between 7am to 7pm - Pager - [phone]  After 7pm go  to [email or web address] - password [email or web address]  And look for the night coverage person covering for me after hours  [facility] Office  [phone]

## 2017-05-14 ENCOUNTER — Inpatient Hospital Stay (HOSPITAL_COMMUNITY): Payer: 59

## 2017-05-14 DIAGNOSIS — R0600 Dyspnea, unspecified: Secondary | ICD-10-CM

## 2017-05-14 LAB — BASIC METABOLIC PANEL
ANION GAP: 12 (ref 5–15)
ANION GAP: 12 (ref 5–15)
BUN: 68 mg/dL — ABNORMAL HIGH (ref 6–20)
BUN: 74 mg/dL — AB (ref 6–20)
CHLORIDE: 90 mmol/L — AB (ref 101–111)
CHLORIDE: 92 mmol/L — AB (ref 101–111)
CO2: 23 mmol/L (ref 22–32)
CO2: 21 mmol/L — ABNORMAL LOW (ref 22–32)
Calcium: 8.7 mg/dL — ABNORMAL LOW (ref 8.9–10.3)
Calcium: 8.8 mg/dL — ABNORMAL LOW (ref 8.9–10.3)
Creatinine, Ser: 1.72 mg/dL — ABNORMAL HIGH (ref 0.44–1.00)
Creatinine, Ser: 1.85 mg/dL — ABNORMAL HIGH (ref 0.44–1.00)
GFR calc Af Amer: 35 mL/min — ABNORMAL LOW (ref >60)
GFR, EST AFRICAN AMERICAN: 38 mL/min — AB (ref >60)
GFR, EST NON AFRICAN AMERICAN: 33 mL/min — AB (ref >60)
GFR, EST NON AFRICAN AMERICAN: 30 mL/min — AB (ref >60)
GLUCOSE: 98 mg/dL (ref 65–99)
Glucose, Bld: 122 mg/dL — ABNORMAL HIGH (ref 65–99)
POTASSIUM: 4.7 mmol/L (ref 3.5–5.1)
POTASSIUM: 5.3 mmol/L — AB (ref 3.5–5.1)
SODIUM: 125 mmol/L — AB (ref 135–145)
Sodium: 125 mmol/L — ABNORMAL LOW (ref 135–145)

## 2017-05-14 LAB — CBC
HCT: 27.2 % — ABNORMAL LOW (ref 36.0–46.0)
HEMATOCRIT: 25.8 % — AB (ref 36.0–46.0)
HEMOGLOBIN: 8.8 g/dL — AB (ref 12.0–15.0)
HEMOGLOBIN: 9 g/dL — AB (ref 12.0–15.0)
MCH: 34.4 pg — ABNORMAL HIGH (ref 26.0–34.0)
MCH: 33.6 pg (ref 26.0–34.0)
MCHC: 34.1 g/dL (ref 30.0–36.0)
MCHC: 33.1 g/dL (ref 30.0–36.0)
MCV: 100.8 fL — ABNORMAL HIGH (ref 78.0–100.0)
MCV: 101.5 fL — AB (ref 78.0–100.0)
Platelets: 157 10*3/uL (ref 150–400)
Platelets: 155 10*3/uL (ref 150–400)
RBC: 2.56 MIL/uL — AB (ref 3.87–5.11)
RBC: 2.68 MIL/uL — AB (ref 3.87–5.11)
RDW: 15 % (ref 11.5–15.5)
RDW: 15.3 % (ref 11.5–15.5)
WBC: 25.5 10*3/uL — AB (ref 4.0–10.5)
WBC: 29.7 10*3/uL — AB (ref 4.0–10.5)

## 2017-05-14 LAB — CBC WITH DIFFERENTIAL/PLATELET
BASOS ABS: 0 10*3/uL (ref 0.0–0.1)
Basophils Relative: 0 %
EOS PCT: 0 %
Eosinophils Absolute: 0 10*3/uL (ref 0.0–0.7)
HEMATOCRIT: 25.3 % — AB (ref 36.0–46.0)
HEMOGLOBIN: 8.4 g/dL — AB (ref 12.0–15.0)
LYMPHS ABS: 1.3 10*3/uL (ref 0.7–4.0)
LYMPHS PCT: 5 %
MCH: 33.5 pg (ref 26.0–34.0)
MCHC: 33.2 g/dL (ref 30.0–36.0)
MCV: 100.8 fL — AB (ref 78.0–100.0)
MONOS PCT: 12 %
Monocytes Absolute: 3.2 10*3/uL — ABNORMAL HIGH (ref 0.1–1.0)
Neutro Abs: 21.8 10*3/uL — ABNORMAL HIGH (ref 1.7–7.7)
Neutrophils Relative %: 83 %
Platelets: 145 10*3/uL — ABNORMAL LOW (ref 150–400)
RBC: 2.51 MIL/uL — ABNORMAL LOW (ref 3.87–5.11)
RDW: 15.3 % (ref 11.5–15.5)
WBC: 26.3 10*3/uL — ABNORMAL HIGH (ref 4.0–10.5)

## 2017-05-14 LAB — BLOOD GAS, ARTERIAL
Acid-base deficit: 1.1 mmol/L (ref 0.0–2.0)
Bicarbonate: 21.6 mmol/L (ref 20.0–28.0)
Drawn by: 511471
O2 CONTENT: 4 L/min
O2 SAT: 85.5 %
PCO2 ART: 27.8 mmHg — AB (ref 32.0–48.0)
PH ART: 7.503 — AB (ref 7.350–7.450)
PO2 ART: 47.7 mmHg — AB (ref 83.0–108.0)
Patient temperature: 98.6

## 2017-05-14 LAB — PROCALCITONIN: PROCALCITONIN: 0.58 ng/mL

## 2017-05-14 MED ORDER — AZITHROMYCIN 500 MG IV SOLR
500.0000 mg | INTRAVENOUS | Status: DC
  Administered 2017-05-14 – 2017-05-18 (×5): 500 mg via INTRAVENOUS
  Filled 2017-05-14 (×6): qty 500

## 2017-05-14 MED ORDER — DEXTROSE 5 % IV SOLN
1.0000 g | INTRAVENOUS | Status: DC
  Filled 2017-05-14: qty 1

## 2017-05-14 MED ORDER — FUROSEMIDE 10 MG/ML IJ SOLN
60.0000 mg | Freq: Once | INTRAMUSCULAR | Status: AC
  Administered 2017-05-14: 60 mg via INTRAVENOUS
  Filled 2017-05-14: qty 6

## 2017-05-14 NOTE — Progress Notes (Addendum)
Called to the bedside by RN at approximately 2016 regarding decline in patient pt status. RN states that she gave ativan around 1930 after noticing patient becoming anxious. On arrival, patient unresponsive to verbal stimuli, tachypneic, and tachycardiac. Assessment revealed diminished LS in in bilateral bases. Patient hypotensive with SBP running in the 80's. SATs in low 90's on 4L [place]. ABGs, chest x ray and Bipap ordered.  RN advised to page NP on-call with results.   Paged at 2200 with ABG and chest xray results:  ABG (on 4L [place]): pH= 7.5          pCO2 = 27          pO2 = 47.7  Chest xray shows worsening hydropneumothorax and bilateral interstitial airspace opacity. Concerning for combination of pneumonia and pulmonary edema.  RN states patient status unchanged from earlier in shift. Continues to be hypotensive, tachycardiac and tachypneic. CCM notified of patient status.    Audrea Muscat, NP  [facility]  [phone]

## 2017-05-14 NOTE — Progress Notes (Addendum)
I came to assess the patient due to increased work of breathing. Patient is now on BIPAP. arousable but not communicating. Labs and radiology reviewed. On cefepime and azithro. CXr worsening effusion and pulm infiltrates. Saturating 100% on BIPAP Coarse crackles bilaterally. Loud systolic murmurs   - stat IV lasix - continue BIPAP  - reassess. Intubation if worsening respiratory status.  - foley cath

## 2017-05-14 NOTE — Progress Notes (Signed)
PROGRESS NOTE    Rhonda Haley  ZOX:[id number] DOB: 02/01/1966 DOA: 14-May-2017 PCP: Lewis Moccasin, MD   Chief Complaint  Patient presents with  . Fever  . Shortness of Breath    Brief Narrative:  HPI On 05/14/17 by Ms. Junious Silk, NP Rhonda Haley is a 51 y.o. female with medical history significant for alcoholic cirrhosis with associated ascites, recurrent right hepatic hydrothorax, chronic hyponatremia, protein calorie malnutrition and anemia. Patient had been started on low-dose prednisone (03/21/17) for acute alcoholic hepatitis in the setting of weakly positive autoimmune markers. Over the past 2 weeks prednisone has been tapered to off with last dosage on 9/24. Patient has had asymptomatic leukocytosis dating back to 8/21 where her white count was documented at 23,300 with repeat CBC on 9/12 white count 29,900. This morning patient awakened with generalized malaise, weakness, chills and subjective fevers. She is not having any abdominal pain, nausea vomiting or diarrhea. She is not had any coughing or sick contacts. She followed up with the GI office she was noted to have a low-grade temperature of 100.4, heart rate was 140, BP was 90/56. Because of the symptoms she was sent to the ER for further evaluation. In the ER she continued with low-grade temperature and tachycardia as well as tachypnea with a low but normal blood pressure readings. Her white count was 20,700 with left shift as previous. Her lactic acid was 2.15. Her sodium was 122. Abdominal exam was benign. Chest x-ray revealed significant right pleural effusion and right basilar opacity. EDP is concerned over possible HCAP and requests evaluation for observation admission. She is not hypoxic at rest. She does report dyspnea on exertion which is chronic.  Interim history Admitted for sepsis secondary to HCAP. She subsequently underwent emergent Thoracentesis and was transferred to SDU. She is improved and GI was consulted for  further evaluation and Recc's. PCCM following for her Respiratory Failure. Patient was started back on Diuretics last night. Felt a little SOB this AM and repeat CXR did not show any worsening of Pleural Effusion. Started Patient on IV Steroids with Solumedrol 40 mg q12h on 05/04/17 and patient improving. Repeat Thoracentesis done by Dr. Grace Isaac and patient had an ex-vacuo pneumothorax after but remained stable.  Overnight the patient appeared to be withdrawing from EtOH so she was placed on CIWA protocol and received Ativan early this AM. The Pneumothorax appeared to get larger today on repeat X-Ray so Dr. Grace Isaac of IR placed a CT Guided 10 French Drainage catheter into the Right Pleural Space yielding 550 cc of additional serous pleural fluid.  PT evaluated and feel as if she needs SNF. Gastroenterology adding Rifaximin for Encephalopathy and Current dose of Lactulose increased. GI also decreased Aldactone to 100 mg Daily ad Lasix to 40 mg po Daily.   Assessment & Plan   Sepsis secondary to presumed healthcare associated pneumonia -Patient presented with fevers, chills, tachycardia, tachypnea and relative hypotension with leukocytosis -Patient has had persistent leukocytosis however likely also affected by steroids -Lactic acid mildly elevated -Chest x-ray showed large right pleural effusion, diffuse alveolar opacities in left lung worrisome for pneumonia or pulmonary edema -Blood cultures show no growth to date -Urine culture showed multiple species -Status post thoracentesis -Fluid culture showed no growth to date -Urine Legionella and strep pneumonia antigens negative -Respiratory viral panel influenza PCR unremarkable -Continue cefepime, discontinued vancomycin on 08/11/2016 -repeat CXR 10/5: persistent patchy infiltrates in the left lung -repeat UA unremarkable for infeciton -CDiff negative -Leukocytosis worsening 25-29, pending  differential -will add on azithromycin for atypical  coverage -not sure where this leukocytosis is coming from  Acute hypoxic respiratory failure secondary to pneumonia, pleural effusion, hepatic hydrothorax -Weaned off of oxygen, currently on room air and maintaining O2 sats in the high 90s -PCCM consulted and appreciated -Continue treatment of underlying conditions  Right Pleural effusion secondary to hepatic hydrothorax -Status post right thoracentesis on 03/19/2017, yielding 2.6 L of clear yellow fluid -Status post thoracentesis on 05/03/2017, yielding 1.2 L of fluid removed -IV fluid discontinued, patient was given 1 dose of IV Lasix -Patient had reported chronic dyspnea on exertion -PCCM consulted and appreciated -Pleural effusion noted to be transudate in -Status post right thoracentesis 05/06/2017 by interventional radiology, Dr. Grace Isaac, 1.4 L removed -Postthoracentesis chest x-ray showed interval development of small presumably ex vacuo pneumothorax -Repeat chest x-ray 05/07/2017 showed continued increase in size of moderate sized right-sided pneumothorax, minimal amount of right to left mediastinal shift. -CT chest tube placement by IR, yielding 550 mL of serous fluid -Chest x-ray 05/08/2017 showed decreased size of a hydropneumothorax, evidence of mild right to left midline shift suggesting some tension -Chest x-ray 05/10/2017, shows right thoracostomy tubes, slightly less right-sided pleural effusion. Persistent right hydropneumothorax. Persistent patchy airspace density bilaterally, more extensive on the left than the right. -chest tube removed on 05/12/2017, IR recommended outpatient follow up to discuss possible TIPS  Decompensated alcoholic services of liver with ascites -Gastroenterology consulted and appreciated -Mildly elevated LFTs Patient recently completed prednisone taper on 05/01/2017, per gastroenterology will not need further taper -Patient with minimal ascites and no abdominal pain-likely not SBP, paracentesis was  canceled -Gastroenterology recommended continuing antibiotic therapy with protein supplements -Continue lactulose, rifaximin -lactulose decreased to  daily  -improvement in the number of bowel movements, only one noted on 10/5 -Spironolactone reduced to  daily, lasix  daily - will hold today due to worsening creatinine  Alcohol abuse/withdrawal -Continue multivitamin, folic acid, thiamine, CIWA protocol   Chronic hyponatremia -Baseline sodium appears to be 124-125 -Diuretics adjusted as above -Sodium currently 125 -Continue to monitor CMP  Murmur -Patient with loud systolic murmur -Echocardiogram July 2018 shows normal systolic function, normal diastolic function without valvular maladies -As above, blood cultures show no growth to date -Patient will need follow-up with cardiology as an outpatient -Echocardiogram EF 70-75%, grade 1 diastolic dysfunction. Aortic valve seems thickened, mild outflow obstruction, no AI.  Anemia of chronic disease/IDA -Hemoglobin currently 8.4, baseline hemoglobin 9 -Continue iron supplementation -Monitor CBC  Severe Protein calorie malnutrition -Nutrition consult and appreciated, continue supplements  Thrombocytopenia, chronic  -Likely secondary to alcohol use -Platelets currently 145-157 today -Continue to monitor CBC  Hyperbilirubinemia -Likely secondary to alcoholism and decompensated liver cirrhosis with ascites -Continue to monitor CMP  Acute kidney injury -Possibly secondary to hepatorenal syndrome or worsening of creatinine secondary to diuretics -Creatinine currently 1.72  -Continue to monitor CMP -obtained Renal ultrasound: normal  Hyperkalemia -K 5.3, would hold spironolactone -Continue to monitor CMP  Deconditioning -PT, OT recommended SNF  DVT Prophylaxis  SCDs  Code Status: Full  Family Communication: None at bedside  Disposition Plan: Admitted.  SNF at discharge when medically  stable  Consultants PCCM Gastroenterology Interventional radiology  Procedures  Thoracentesis 05/03/2017, 1.2 L of fluid removed Echocardiogram Ultrasound-guided right thoracentesis yielding 1.4 L fluid removed on 05/06/2017 CT-guided cyst chest tube placed 05/07/2017 for pneumothorax  Antibiotics   Anti-infectives    Start     Dose/Rate Route Frequency Ordered Stop   05/15/17 1000  ceFEPIme (  MAXIPIME) 1 g in dextrose 5 % 50 mL IVPB     1 g 100 mL/hr over 30 Minutes Intravenous Every 24 hours 05/14/17 1131     05/14/17 1200  azithromycin (ZITHROMAX) 500 mg in dextrose 5 % 250 mL IVPB     500 mg 250 mL/hr over 60 Minutes Intravenous Every 24 hours 05/14/17 1029     05/12/17 1000  ceFEPIme (MAXIPIME) 2 g in dextrose 5 % 50 mL IVPB  Status:  Discontinued     2 g 100 mL/hr over 30 Minutes Intravenous Every 24 hours 05/12/17 0817 05/14/17 1131   05/11/17 2200  ceFEPIme (MAXIPIME) 1 g in dextrose 5 % 50 mL IVPB  Status:  Discontinued     1 g 100 mL/hr over 30 Minutes Intravenous Every 12 hours 05/11/17 1101 05/12/17 0817   05/09/17 0800  ceFEPIme (MAXIPIME) 1 g in dextrose 5 % 50 mL IVPB  Status:  Discontinued     1 g 100 mL/hr over 30 Minutes Intravenous Every 12 hours 05/08/17 1543 05/11/17 1101   05/08/17 1500  rifaximin (XIFAXAN) tablet 550 mg     550 mg Oral 2 times daily 05/08/17 1417     05/06/17 2200  vancomycin (VANCOCIN) IVPB 1000 mg/200 mL premix  Status:  Discontinued     1,000 mg 200 mL/hr over 60 Minutes Intravenous Every 24 hours 05/06/17 1224 05/11/17 1101   04/25/2017 2200  ceFEPIme (MAXIPIME) 1 g in dextrose 5 % 50 mL IVPB  Status:  Discontinued     1 g 100 mL/hr over 30 Minutes Intravenous Every 8 hours 05/07/2017 2055 05/08/17 1543   04/09/2017 2130  vancomycin (VANCOCIN) IVPB 750 mg/150 ml premix  Status:  Discontinued     750 mg 150 mL/hr over 60 Minutes Intravenous Every 12 hours 04/14/2017 2102 05/06/17 1213   05/05/2017 1615  vancomycin (VANCOCIN) IVPB 1000 mg/200  mL premix     1,000 mg 200 mL/hr over 60 Minutes Intravenous  Once 05/03/2017 1614 04/09/2017 1736   04/16/2017 1615  piperacillin-tazobactam (ZOSYN) IVPB 3.375 g     3.375 g 100 mL/hr over 30 Minutes Intravenous  Once 05/04/2017 1614 05/03/2017 1758      Subjective:   Alacia Rehmann seen and examined today.  Patient very anxious today regarding rehab. Denies chest pain, shortness of breath, abdominal pain, N/V/C.  Feels diarrhea has improved.   Objective:   Vitals:   05/14/17 0337 05/14/17 0420 05/14/17 0828 05/14/17 1147  BP: (!) 96/51  (!) 88/45 (!) 87/50  Pulse: (!) 114  (!) 108 (!) 101  Resp: (!) 46  (!) 38 (!) 33  Temp: 98.4 F (36.9 C)  (!) 97.5 F (36.4 C) 98.4 F (36.9 C)  TempSrc: Oral  Oral Oral  SpO2: 100%  100% 100%  Weight:  51.1 kg (112 lb 11.2 oz)    Height:        Intake/Output Summary (Last 24 hours) at 05/14/17 1327 Last data filed at 05/14/17 1151  Gross per 24 hour  Intake              470 ml  Output                0 ml  Net              470 ml   Filed Weights   05/12/17 0325 05/13/17 0611 05/14/17 0420  Weight: 50.7 kg (111 lb 12.4 oz) 50.7 kg (111 lb 12.4 oz) 51.1 kg (112  lb 11.2 oz)   Exam  General: Well developed, well nourished, NAD, appears stated age  HEENT: NCAT, mucous membranes moist.  Cardiovascular: S1 S2 auscultated, 3/6 SEM, RRR  Respiratory: Diminished on the right, clear on the left  Abdomen: Soft, nontender, nondistended, + bowel sounds  Extremities: warm dry without cyanosis clubbing or edema  Neuro: AAOx3, nonfocal  Psych: Anxious  Data Reviewed: I have personally reviewed following labs and imaging studies  CBC:  Recent Labs Lab 05/08/17 1023 05/09/17 0218 05/10/17 0225  05/12/17 0323 05/13/17 0154 05/14/17 0236 05/14/17 0922 05/14/17 1137  WBC 13.3* 17.0* 11.6*  < > 17.3* 16.7* 25.5* 29.7* 26.3*  NEUTROABS 10.5* 13.8* 9.2*  --   --   --   --   --  21.8*  HGB 11.7* 9.2* 7.9*  < > 9.1* 8.2* 8.8* 9.0* 8.4*  HCT 34.3*  27.8* 23.2*  < > 27.5* 24.5* 25.8* 27.2* 25.3*  MCV 101.5* 100.0 101.3*  < > 100.7* 100.4* 100.8* 101.5* 100.8*  PLT PLATELETS APPEAR DECREASED 135* 104*  < > 120* 125* 157 155 145*  < > = values in this interval not displayed. Basic Metabolic Panel:  Recent Labs Lab 05/08/17 1023 05/09/17 0218 05/10/17 0225 05/11/17 0241 05/12/17 0323 05/13/17 0154 05/14/17 0236 05/14/17 0922  NA 129* 124* 128* 123* 124* 123* 125* 125*  K 5.8* 5.4* 3.8 3.9 5.0 4.2 4.7 5.3*  CL 100* 93* 93* 89* 88* 90* 90* 92*  CO2 18* 23 27 26 26  21* 23 21*  GLUCOSE 143* 114* 142* 103* 101* 79 122* 98  BUN 57* 53* 52* 56* 67* 64* 68* 74*  CREATININE 1.15* 0.98 1.09* 1.25* 1.62* 1.44* 1.72* 1.85*  CALCIUM 9.8 9.6 8.9 8.5* 8.6* 8.6* 8.7* 8.8*  MG 2.6* 2.4 2.1  --   --   --   --   --   PHOS 6.0* 2.9 3.7  --   --   --   --   --    GFR: Estimated Creatinine Clearance: 28.4 mL/min (A) (by C-G formula based on SCr of 1.85 mg/dL (H)). Liver Function Tests:  Recent Labs Lab 05/08/17 1023 05/09/17 0218 05/10/17 0225 05/12/17 0323  AST 63* 58* 75* 82*  ALT 74* 74* 82* 90*  ALKPHOS 106 109 101 107  BILITOT 8.0* 6.0* 4.9* 5.7*  PROT 6.8 6.1* 5.5* 5.7*  ALBUMIN 2.0* 1.8* 1.7* 1.8*   No results for input(s): LIPASE, AMYLASE in the last 168 hours.  Recent Labs Lab 05/09/17 0218  AMMONIA 49*   Coagulation Profile:  Recent Labs Lab 05/12/17 0323  INR 1.52   Cardiac Enzymes: No results for input(s): CKTOTAL, CKMB, CKMBINDEX, TROPONINI in the last 168 hours. BNP (last 3 results) No results for input(s): PROBNP in the last 8760 hours. HbA1C: No results for input(s): HGBA1C in the last 72 hours. CBG: No results for input(s): GLUCAP in the last 168 hours. Lipid Profile: No results for input(s): CHOL, HDL, LDLCALC, TRIG, CHOLHDL, LDLDIRECT in the last 72 hours. Thyroid Function Tests: No results for input(s): TSH, T4TOTAL, FREET4, T3FREE, THYROIDAB in the last 72 hours. Anemia Panel: No results for  input(s): VITAMINB12, FOLATE, FERRITIN, TIBC, IRON, RETICCTPCT in the last 72 hours. Urine analysis:    Component Value Date/Time   COLORURINE YELLOW 05/13/2017 0650   APPEARANCEUR CLEAR 05/13/2017 0650   LABSPEC 1.011 05/13/2017 0650   PHURINE 5.0 05/13/2017 0650   GLUCOSEU NEGATIVE 05/13/2017 0650   HGBUR NEGATIVE 05/13/2017 0650   BILIRUBINUR NEGATIVE 05/13/2017  0650   KETONESUR NEGATIVE 05/13/2017 0650   PROTEINUR NEGATIVE 05/13/2017 0650   NITRITE NEGATIVE 05/13/2017 0650   LEUKOCYTESUR NEGATIVE 05/13/2017 0650   Sepsis Labs: @LABRCNTIP (procalcitonin:4,lacticidven:4)  ) Recent Results (from the past 240 hour(s))  MRSA PCR Screening     Status: None   Collection Time: 05/09/17 11:57 AM  Result Value Ref Range Status   MRSA by PCR NEGATIVE NEGATIVE Final    Comment:        The GeneXpert MRSA Assay (FDA approved for NASAL specimens only), is one component of a comprehensive MRSA colonization surveillance program. It is not intended to diagnose MRSA infection nor to guide or monitor treatment for MRSA infections.   C difficile quick scan w PCR reflex     Status: None   Collection Time: 05/12/17  8:58 PM  Result Value Ref Range Status   C Diff antigen NEGATIVE NEGATIVE Final   C Diff toxin NEGATIVE NEGATIVE Final   C Diff interpretation No C. difficile detected.  Final      Radiology Studies: US Renal  Result Date: 05/14/2017 CLINICAL DATA:  Acute kidney injury EXAM: RENAL / URINARY TRACT ULTRASOUND COMPLETE COMPARISON:  03/19/2017 FINDINGS: Right Kidney: Length: 11.4 cm. Echogenicity within normal limits. No mass or hydronephrosis visualized. Left Kidney: Length: 11.6 cm. Echogenicity within normal limits. No mass or hydronephrosis visualized. Bladder: Appears normal for degree of bladder distention. Right pleural effusion. IMPRESSION: 1. Normal sonographic appearance of the kidneys. Electronically Signed   By: Amie Portland M.D.   On: 05/14/2017 09:12   Dg Chest  Port 1 View  Result Date: 05/14/2017 CLINICAL DATA:  Followup for hydropneumothorax. EXAM: PORTABLE CHEST 1 VIEW COMPARISON:  05/13/2017 FINDINGS: No significant change in right-sided hydropneumothorax. No left pneumothorax. Bilateral interstitial and hazy airspace opacities are stable. Right-sided subcutaneous emphysema has mildly increased since the previous day's study. IMPRESSION: 1. Mild increase in the right sided subcutaneous emphysema since the previous day's exam. No other change. 2. Persistent right-sided hydropneumothorax. 3. Persistent bilateral interstitial and hazy airspace lung opacities. A combination of infection and atelectasis suspected. Electronically Signed   By: Amie Portland M.D.   On: 05/14/2017 07:14   Dg Chest Port 1 View  Result Date: 05/13/2017 CLINICAL DATA:  Evaluate pneumothorax. EXAM: PORTABLE CHEST 1 VIEW COMPARISON:  05/12/2017 FINDINGS: The fluid and gas component of the right hydropneumothorax have not significantly changed in size. However, there is markedly increased subcutaneous gas in the right chest. In addition, there are increased at densities in the right hilum and central aspect of the right lung. Persistent parenchymal densities throughout the left lung are unchanged. Heart size is within normal limits and stable. The trachea is midline. IMPRESSION: Increased densities in the right hilum and central aspect of the right lung. Findings could represent worsening atelectasis or edema. No significant change in the size of the right hydropneumothorax. However, there is now a large amount of right chest subcutaneous gas. Persistent parenchymal disease throughout the left lung with minimal change. Electronically Signed   By: Richarda Overlie M.D.   On: 05/13/2017 08:12     Scheduled Meds: . escitalopram  20 mg Oral Daily  . feeding supplement (ENSURE ENLIVE)  237 mL Oral TID BM  . ferrous sulfate  325 mg Oral BID WC  . folic acid  1 mg Oral Daily  . furosemide  40 mg  Oral Daily  . lactulose  10 g Oral Daily  . mouth rinse  15 mL Mouth Rinse BID  .  Melatonin  6 mg Oral QHS  . multivitamin with minerals  1 tablet Oral Daily  . pantoprazole  20 mg Oral QAC breakfast  . rifaximin  550 mg Oral BID  . sodium chloride flush  3 mL Intravenous Q12H  . spironolactone  100 mg Oral Daily  . thiamine injection  100 mg Intravenous Daily   Or  . thiamine  100 mg Oral Daily   Continuous Infusions: . azithromycin 500 mg (05/14/17 1147)  . [START ON 05/15/2017] ceFEPime (MAXIPIME) IV       LOS: 11 days   Time Spent in minutes   40 minutes  Meggen Spaziani D.O. on 05/14/2017 at 1:27 PM  Between 7am to 7pm - Pager - [phone]  After 7pm go to [email or web address] - password [email or web address]  And look for the night coverage person covering for me after hours  [facility] Office  [phone]

## 2017-05-14 NOTE — Progress Notes (Signed)
Md paged.  After getting pt up to [facility] pt hr 120, RR 60 assist pt back to bed. Pt anxious, shaking, gave 2 ativan.  Pt still working to breathe.  Md and Rapid to bedside.  RT called.  New orders received.  Will continue to monitor. Karena Addison T

## 2017-05-14 NOTE — Progress Notes (Addendum)
[facility] Progress Note  Chief Complaint:   cirrhosis   Subjective: No complaints right now but I woke her from sleep  Objective:  Vital signs in last 24 hours: Temp:  [97.5 F (36.4 C)-99 F (37.2 C)] 98.4 F (36.9 C) (10/07 1147) Pulse Rate:  [101-114] 101 (10/07 1147) Resp:  [33-46] 33 (10/07 1147) BP: (87-97)/(45-52) 87/50 (10/07 1147) SpO2:  [100 %] 100 % (10/07 1147) Weight:  [112 lb 11.2 oz (51.1 kg)] 112 lb 11.2 oz (51.1 kg) (10/07 0420) Last BM Date: 05/14/17   General:   Sleepy, tachypneic  EENT:  Normal hearing, icteric sclera, conjunctive pink.  Heart:  Tachycardia.  and rhythm;no lower extremity edema Pulm: increased work of breathing. Normal respiratory effort, lungs CTA bilaterally without wheezes or crackles. Abdomen:  Soft, nondistended, nontender.  Normal bowel sounds, no masses felt. No hepatomegaly.    Neurologic:  Alert and  oriented x4;  grossly normal neurologically. Psych:  Pleasant, cooperative.  Normal mood and affect.    Intake/Output from previous day: 10/06 0701 - 10/07 0700 In: 170 [P.O.:120; IV Piggyback:50] Out: -  Intake/Output this shift: Total I/O In: 300 [IV Piggyback:300] Out: -   Lab Results:  Recent Labs  05/14/17 0236 05/14/17 0922 05/14/17 1137  WBC 25.5* 29.7* 26.3*  HGB 8.8* 9.0* 8.4*  HCT 25.8* 27.2* 25.3*  PLT 157 155 145*   BMET  Recent Labs  05/13/17 0154 05/14/17 0236 05/14/17 0922  NA 123* 125* 125*  K 4.2 4.7 5.3*  CL 90* 90* 92*  CO2 21* 23 21*  GLUCOSE 79 122* 98  BUN 64* 68* 74*  CREATININE 1.44* 1.72* 1.85*  CALCIUM 8.6* 8.7* 8.8*   LFT  Recent Labs  05/12/17 0323  PROT 5.7*  ALBUMIN 1.8*  AST 82*  ALT 90*  ALKPHOS 107  BILITOT 5.7*  BILIDIR 2.6*  IBILI 3.1*   PT/INR  Recent Labs  05/12/17 0323  LABPROT 18.2*  INR 1.52   Hepatitis Panel No results for input(s): HEPBSAG, HCVAB, HEPAIGM, HEPBIGM in the last 72 hours.  US Renal  Result Date:  05/14/2017 CLINICAL DATA:  Acute kidney injury EXAM: RENAL / URINARY TRACT ULTRASOUND COMPLETE COMPARISON:  03/19/2017 FINDINGS: Right Kidney: Length: 11.4 cm. Echogenicity within normal limits. No mass or hydronephrosis visualized. Left Kidney: Length: 11.6 cm. Echogenicity within normal limits. No mass or hydronephrosis visualized. Bladder: Appears normal for degree of bladder distention. Right pleural effusion. IMPRESSION: 1. Normal sonographic appearance of the kidneys. Electronically Signed   By: Amie Portland M.D.   On: 05/14/2017 09:12   Dg Chest Port 1 View  Result Date: 05/14/2017 CLINICAL DATA:  Followup for hydropneumothorax. EXAM: PORTABLE CHEST 1 VIEW COMPARISON:  05/13/2017 FINDINGS: No significant change in right-sided hydropneumothorax. No left pneumothorax. Bilateral interstitial and hazy airspace opacities are stable. Right-sided subcutaneous emphysema has mildly increased since the previous day's study. IMPRESSION: 1. Mild increase in the right sided subcutaneous emphysema since the previous day's exam. No other change. 2. Persistent right-sided hydropneumothorax. 3. Persistent bilateral interstitial and hazy airspace lung opacities. A combination of infection and atelectasis suspected. Electronically Signed   By: Amie Portland M.D.   On: 05/14/2017 07:14   Dg Chest Port 1 View  Result Date: 05/13/2017 CLINICAL DATA:  Evaluate pneumothorax. EXAM: PORTABLE CHEST 1 VIEW COMPARISON:  05/12/2017 FINDINGS: The fluid and gas component of the right hydropneumothorax have not significantly changed in size. However, there is markedly increased subcutaneous gas in the right chest.  In addition, there are increased at densities in the right hilum and central aspect of the right lung. Persistent parenchymal densities throughout the left lung are unchanged. Heart size is within normal limits and stable. The trachea is midline. IMPRESSION: Increased densities in the right hilum and central aspect of the  right lung. Findings could represent worsening atelectasis or edema. No significant change in the size of the right hydropneumothorax. However, there is now a large amount of right chest subcutaneous gas. Persistent parenchymal disease throughout the left lung with minimal change. Electronically Signed   By: Richarda Overlie M.D.   On: 05/13/2017 08:12    ASSESSMENT / PLAN:  1. 51 yo female with decompensated ETOH cirrhosis with PSE , ascites and right hepatic hydrothorax. Sustained a right pneumothorax with thoracentesis. Chest tube removed 10/5. Today's CXR shows mild increase in right SQ emphysema since yesterday, persistent right pneumothorax and persistent bilateral interstitial and hazy airspace opacities. She is really having increased WOB. Her sleeping resp rate when I walked in was in the 40's. O2 sats okay.  -I called Hospitalist who shares my concern about respiratory status. She added Azithromycin for atypicals today. She is calling IR back now. Consider asking Pulmonary to re-evaluate as well.  -Her ascites is actually a little better, no asterixis. No changes in management of cirrhosis at this point.   2. AKI, worsening. Cr 1.4 >>> 1.72 >>>1.85.  -Diuretics were stopped today (got am dose)  3. Hyperkalemia, Kayexalate was ordered.  4. Sepsis / persistent leukocytosis, ? Secondary to PNA?  Hospitalist added Azithromycin for atypicals today.   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 I agree with the Advanced Practitioner's note, impression and recommendations. Resp status is worsening. Notified hospitalist and they will evaluate and consider asking Pulm to re-evaluate. Diuretics on hold with increased Cr. Consider adding midodrine when pt more stable.   Claudette Head, MD Clementeen Graham [phone] Mon-Fri 8a-5p [phone] after 5p, weekends, holidays

## 2017-05-14 NOTE — Progress Notes (Signed)
PHARMACY NOTE:  ANTIMICROBIAL RENAL DOSAGE ADJUSTMENT  Current antimicrobial regimen includes a mismatch between antimicrobial dosage and estimated renal function.  As per policy approved by the Pharmacy & Therapeutics and Medical Executive Committees, the antimicrobial dosage will be adjusted accordingly.  Current antimicrobial dosage:  Cefepime 2g IV q24h  Indication: HCAP  Renal Function: CrCl ~28 ml/min  Estimated Creatinine Clearance: 28.4 mL/min (A) (by C-G formula based on SCr of 1.85 mg/dL (H)).      On intermittent HD, scheduled:      On CRRT    Antimicrobial dosage has been changed to:  Cefepime 1g IV q24h  Additional comments:   Thank you for allowing pharmacy to be a part of this patient's care.  Fredonia Highland, PharmD PGY-2 Cardiology Pharmacy Resident Pager: [phone] 05/14/2017

## 2017-05-14 NOTE — Progress Notes (Signed)
Md notified.  ABG and STAT PCXR obtained.   Pt on BIPAP.  bp 80/45 RR 47  No improvement noted.  Will continue to monitor Karena Addison T

## 2017-05-15 ENCOUNTER — Inpatient Hospital Stay (HOSPITAL_COMMUNITY): Payer: 59

## 2017-05-15 DIAGNOSIS — J189 Pneumonia, unspecified organism: Secondary | ICD-10-CM

## 2017-05-15 DIAGNOSIS — D509 Iron deficiency anemia, unspecified: Secondary | ICD-10-CM

## 2017-05-15 DIAGNOSIS — J948 Other specified pleural conditions: Secondary | ICD-10-CM

## 2017-05-15 LAB — CBC
HEMATOCRIT: 25 % — AB (ref 36.0–46.0)
HEMOGLOBIN: 8.2 g/dL — AB (ref 12.0–15.0)
MCH: 33.5 pg (ref 26.0–34.0)
MCHC: 32.8 g/dL (ref 30.0–36.0)
MCV: 102 fL — ABNORMAL HIGH (ref 78.0–100.0)
Platelets: 167 10*3/uL (ref 150–400)
RBC: 2.45 MIL/uL — AB (ref 3.87–5.11)
RDW: 15.4 % (ref 11.5–15.5)
WBC: 29.7 10*3/uL — AB (ref 4.0–10.5)

## 2017-05-15 LAB — BASIC METABOLIC PANEL
Anion gap: 14 (ref 5–15)
BUN: 76 mg/dL — AB (ref 6–20)
CALCIUM: 8.7 mg/dL — AB (ref 8.9–10.3)
CHLORIDE: 92 mmol/L — AB (ref 101–111)
CO2: 20 mmol/L — AB (ref 22–32)
CREATININE: 2.03 mg/dL — AB (ref 0.44–1.00)
GFR calc Af Amer: 31 mL/min — ABNORMAL LOW (ref >60)
GFR calc non Af Amer: 27 mL/min — ABNORMAL LOW (ref >60)
GLUCOSE: 90 mg/dL (ref 65–99)
POTASSIUM: 5 mmol/L (ref 3.5–5.1)
SODIUM: 126 mmol/L — AB (ref 135–145)

## 2017-05-15 LAB — GLUCOSE, CAPILLARY
GLUCOSE-CAPILLARY: 99 mg/dL (ref 65–99)
Glucose-Capillary: 123 mg/dL — ABNORMAL HIGH (ref 65–99)

## 2017-05-15 LAB — MAGNESIUM: Magnesium: 2.3 mg/dL (ref 1.7–2.4)

## 2017-05-15 LAB — PHOSPHORUS: PHOSPHORUS: 5.5 mg/dL — AB (ref 2.5–4.6)

## 2017-05-15 MED ORDER — VITAMIN B-1 100 MG PO TABS
100.0000 mg | ORAL_TABLET | Freq: Every day | ORAL | Status: DC
  Administered 2017-05-16 – 2017-05-18 (×3): 100 mg
  Filled 2017-05-15 (×3): qty 1

## 2017-05-15 MED ORDER — MIDAZOLAM HCL 2 MG/2ML IJ SOLN: 2.0000 mg | INTRAMUSCULAR | Status: DC | PRN

## 2017-05-15 MED ORDER — FENTANYL CITRATE (PF) 100 MCG/2ML IJ SOLN
100.0000 ug | INTRAMUSCULAR | Status: DC | PRN
  Administered 2017-05-15: 100 ug via INTRAVENOUS
  Filled 2017-05-15: qty 2

## 2017-05-15 MED ORDER — PHENYLEPHRINE HCL 10 MG/ML IJ SOLN
0.0000 ug/min | INTRAMUSCULAR | Status: DC
  Administered 2017-05-15 (×4): 100 ug/min via INTRAVENOUS
  Administered 2017-05-15: 10 ug/min via INTRAVENOUS
  Administered 2017-05-15 (×3): 100 ug/min via INTRAVENOUS
  Filled 2017-05-15 (×8): qty 1

## 2017-05-15 MED ORDER — SODIUM CHLORIDE 0.9% FLUSH
10.0000 mL | Freq: Two times a day (BID) | INTRAVENOUS | Status: DC
  Administered 2017-05-15 – 2017-05-18 (×6): 10 mL

## 2017-05-15 MED ORDER — FOLIC ACID 1 MG PO TABS
1.0000 mg | ORAL_TABLET | Freq: Every day | ORAL | Status: DC
  Administered 2017-05-16 – 2017-05-18 (×3): 1 mg
  Filled 2017-05-15 (×3): qty 1

## 2017-05-15 MED ORDER — CHLORHEXIDINE GLUCONATE CLOTH 2 % EX PADS
6.0000 | MEDICATED_PAD | Freq: Every day | CUTANEOUS | Status: DC
  Administered 2017-05-15 – 2017-05-17 (×3): 6 via TOPICAL

## 2017-05-15 MED ORDER — VITAL AF 1.2 CAL PO LIQD
1000.0000 mL | ORAL | Status: DC
Start: 2017-05-15 — End: 2017-05-18
  Administered 2017-05-15 – 2017-05-17 (×3): 1000 mL

## 2017-05-15 MED ORDER — SODIUM CHLORIDE 0.9 % IV SOLN
1.0000 g | Freq: Two times a day (BID) | INTRAVENOUS | Status: DC
  Administered 2017-05-15 – 2017-05-18 (×7): 1 g via INTRAVENOUS
  Filled 2017-05-15 (×7): qty 1

## 2017-05-15 MED ORDER — MIDAZOLAM HCL 2 MG/2ML IJ SOLN
INTRAMUSCULAR | Status: AC
  Administered 2017-05-15: 2 mg
  Filled 2017-05-15: qty 2

## 2017-05-15 MED ORDER — SODIUM CHLORIDE 0.9 % IV SOLN
0.0000 ug/min | INTRAVENOUS | Status: DC
  Administered 2017-05-15: 120 ug/min via INTRAVENOUS
  Administered 2017-05-16: 200 ug/min via INTRAVENOUS
  Administered 2017-05-16 (×2): 400 ug/min via INTRAVENOUS
  Filled 2017-05-15 (×6): qty 4

## 2017-05-15 MED ORDER — FENTANYL 2500MCG IN NS 250ML (10MCG/ML) PREMIX INFUSION
25.0000 ug/h | INTRAVENOUS | Status: DC
  Administered 2017-05-15: 75 ug/h via INTRAVENOUS
  Administered 2017-05-17: 20 ug/h via INTRAVENOUS
  Filled 2017-05-15 (×2): qty 250

## 2017-05-15 MED ORDER — RIFAXIMIN 550 MG PO TABS
550.0000 mg | ORAL_TABLET | Freq: Two times a day (BID) | ORAL | Status: DC
  Administered 2017-05-15 – 2017-05-16 (×2): 550 mg
  Filled 2017-05-15 (×2): qty 1

## 2017-05-15 MED ORDER — SODIUM CHLORIDE 0.9% FLUSH
10.0000 mL | INTRAVENOUS | Status: DC | PRN
  Administered 2017-05-16: 10 mL
  Filled 2017-05-15: qty 40

## 2017-05-15 MED ORDER — VITAL HIGH PROTEIN PO LIQD: 1000.0000 mL | ORAL | Status: DC

## 2017-05-15 MED ORDER — FENTANYL CITRATE (PF) 100 MCG/2ML IJ SOLN: 100.0000 ug | INTRAMUSCULAR | Status: DC | PRN

## 2017-05-15 MED ORDER — FOLIC ACID 5 MG/ML IJ SOLN
1.0000 mg | Freq: Every day | INTRAMUSCULAR | Status: DC
  Filled 2017-05-15: qty 0.2

## 2017-05-15 MED ORDER — FENTANYL BOLUS VIA INFUSION
50.0000 ug | INTRAVENOUS | Status: DC | PRN
  Filled 2017-05-15: qty 50

## 2017-05-15 MED ORDER — PANTOPRAZOLE SODIUM 40 MG IV SOLR
40.0000 mg | INTRAVENOUS | Status: DC
  Administered 2017-05-15 – 2017-05-18 (×4): 40 mg via INTRAVENOUS
  Filled 2017-05-15 (×4): qty 40

## 2017-05-15 MED ORDER — PRO-STAT SUGAR FREE PO LIQD
30.0000 mL | Freq: Two times a day (BID) | ORAL | Status: DC
  Administered 2017-05-15 (×2): 30 mL
  Administered 2017-05-16: 23:00:00
  Administered 2017-05-16 – 2017-05-18 (×4): 30 mL
  Filled 2017-05-15 (×8): qty 30

## 2017-05-15 MED ORDER — FENTANYL CITRATE (PF) 100 MCG/2ML IJ SOLN: 50.0000 ug | Freq: Once | INTRAMUSCULAR | Status: DC

## 2017-05-15 NOTE — Progress Notes (Signed)
PULMONARY / CRITICAL CARE MEDICINE   Name: Rhonda Haley MRN: [id number] DOB: [birth year]    ADMISSION DATE:  04/25/2017 CONSULTATION DATE:  05/12/2017   REFERRING MD:  Russella Dar  CHIEF COMPLAINT:  Fever, chills   HISTORY OF PRESENT ILLNESS:   51 year old with chronic liver disease who presented with fevers chills and hypotension. She had a significant white count with a left shift and she was started on empiric therapy for healthcare associated pneumonia with a combination of vancomycin and cefepime. She has a known history of prior hepatic pleural effusion. She had a very large right pleural effusion on this admission which was drained with a pigtail catheter on September 30. That fluid has shown no growth and had a LDH of only 34 a pH of 8.1 and only 24 white blood cells. The pigtail catheter was subsequently removed. CT with the pigtail in place showed a right hydropneumothorax and no loculation along with diffuse pulmonary infiltrates. Worsening respiratory status on Bipap was intubated and transferred to ICU on morning of 10/8.    REVIEW OF SYSTEMS:   No fevers, chills.  No rigors.   SUBJECTIVE:  Intubated at 3AM this morning - Transferred to ICU 4AM for worsening mental status and ongoing tachypnea on bipap.   PICC line also placed this morning.    VITAL SIGNS: BP (!) 73/38 (BP Location: Left Arm)   Pulse 84   Temp 98.3 F (36.8 C) (Oral)   Resp 14   Ht  (1.727 m)   Wt 111 lb 8.8 oz (50.6 kg)   SpO2 100%   BMI 16.96 kg/m   HEMODYNAMICS:    VENTILATOR SETTINGS: Vent Mode: PRVC FiO2 (%):  [40 %-60 %] 40 % Set Rate:  [14 bmp] 14 bmp Vt Set:  [520 mL] 520 mL PEEP:  [5 cmH20] 5 cmH20 Pressure Support:  [10 cmH20] 10 cmH20 Plateau Pressure:  [22 cmH20-24 cmH20] 22 cmH20  INTAKE / OUTPUT: I/O last 3 completed shifts: In: 900 [P.O.:120; I.V.:480; IV Piggyback:300] Out: 175 [Urine:175]  PHYSICAL EXAMINATION: General: thin, on vent, appears comfortable Cardiovascular: RRR  no MRG, 2+ pedal pulses bilaterally  Lungs: coarse lung sounds, no wheeze  Abdomen: soft, NTND, +bs , mild distention with no fluid wave present  Neuro: awakens to sternal rub,  does not follow commands   LABS: C diff negative   BMET  Recent Labs Lab 05/14/17 0236 05/14/17 0922 05/15/17 0231  NA 125* 125* 126*  K 4.7 5.3* 5.0  CL 90* 92* 92*  CO2 23 21* 20*  BUN 68* 74* 76*  CREATININE 1.72* 1.85* 2.03*  GLUCOSE 122* 98 90    Electrolytes  Recent Labs Lab 05/09/17 0218 05/10/17 0225  05/14/17 0236 05/14/17 0922 05/15/17 0231  CALCIUM 9.6 8.9  < > 8.7* 8.8* 8.7*  MG 2.4 2.1  --   --   --   --   PHOS 2.9 3.7  --   --   --   --   < > = values in this interval not displayed.  CBC  Recent Labs Lab 05/14/17 0922 05/14/17 1137 05/15/17 0231  WBC 29.7* 26.3* 29.7*  HGB 9.0* 8.4* 8.2*  HCT 27.2* 25.3* 25.0*  PLT 155 145* 167    Coag's  Recent Labs Lab 05/12/17 0323  INR 1.52   Sepsis Markers  Recent Labs Lab 05/12/17 1501 05/13/17 0154 05/14/17 0236  PROCALCITON 0.54 0.56 0.58   ABG  Recent Labs Lab 05/14/17 2034  PHART 7.503*  PCO2ART  27.8*  PO2ART 47.7*   Liver Enzymes  Recent Labs Lab 05/09/17 0218 05/10/17 0225 05/12/17 0323  AST 58* 75* 82*  ALT 74* 82* 90*  ALKPHOS 109 101 107  BILITOT 6.0* 4.9* 5.7*  ALBUMIN 1.8* 1.7* 1.8*   Cardiac Enzymes No results for input(s): TROPONINI, PROBNP in the last 168 hours.  Glucose  Recent Labs Lab 05/15/17 0426  GLUCAP 123*   Imaging Dg Chest Port 1 View  Result Date: 05/15/2017 CLINICAL DATA:  Intubation EXAM: PORTABLE CHEST 1 VIEW COMPARISON:  Chest radiograph 05/14/2017 FINDINGS: Endotracheal tube tip is at the level of the clavicular heads, in satisfactory position. There is diffuse reticular opacity throughout the left lung, unchanged. There is a large right hydropneumothorax that has decreased in size from the prior study. It now occupies approximately 1/3 of the right  hemithorax. There is subcutaneous emphysema within the right axillary soft tissues. IMPRESSION: 1. Endotracheal tube tip in appropriate position at the level of the clavicular heads. 2. Slight decrease in size of right hydropneumothorax. Electronically Signed   By: Deatra Robinson M.D.   On: 05/15/2017 04:16   Dg Chest Port 1 View  Result Date: 05/14/2017 CLINICAL DATA:  Follow-up subacute onset of shortness of breath. Altered mental status. Initial encounter. EXAM: PORTABLE CHEST 1 VIEW COMPARISON:  Chest radiograph performed earlier today at 5:08 a.m. FINDINGS: A moderate to large right-sided hydropneumothorax is perhaps mildly increased from the prior study. Right-sided soft tissue emphysema is again noted. There appears to be worsening bilateral interstitial and hazy airspace opacity. This is concerning for a combination of pneumonia and pulmonary edema. The cardiomediastinal silhouette is borderline normal in size. No acute osseous abnormalities are identified. IMPRESSION: 1. Moderate to large right-sided hydropneumothorax is perhaps mildly increased in size from the prior study. 2. Worsening bilateral interstitial and hazy airspace opacity, concerning for a combination of pneumonia and pulmonary edema. 3. Right-sided soft tissue emphysema again noted. Electronically Signed   By: Roanna Raider M.D.   On: 05/14/2017 21:36   STUDIES:  -CXR 10/7 > mod to large R side hydropneumo, worsening bilateral interstitial and hazy airspace opacity concerning for a combination of pneumonia and pulmonary edema  -CXR 10/8 > ETT in good position, slight decrease in size of right hydropneumothorax    CULTURES: Resp cx 10/8 >>  Bcx 10/8 >>  ANTIBIOTICS: Azithro 10/7 > Cefepime 10/8 >   SIGNIFICANT EVENTS: ETT 10/8 for worsening resp status on Bipap    LINES/TUBES: -ETT 10/8 >  -double lumen 10/8>  -PIV R arm 10/7 -PIV L posterior forearm 10/8  ASSESSMENT / PLAN:  PULMONARY Acute respiratory failure  requiring mechanical ventilation Initially on Bipap but with worsening resp status  CXR -   CARDIOVASCULAR IV Lasix to diurese Monitor SCr with lasix  Neo   RENAL Replete electrolytes as needed  Foley cath   GASTROINTESTINAL H/o alcoholic cirrhosis with ascites  -Continue lactulose NPO while vented Consider starting TF  Protonix  HEMATOLOGIC Normocytic anemia with Hgb 8.2 No active bleeding Continue to monitor   INFECTIOUS PICC line placed this morning  Azithro and Cefepime for presumed ?HCAP   ENDOCRINE No h/o diabetes  Monitor CBGs   NEUROLOGIC Continue home Lexapro  Requiring mechanical ventilation  Fentanyl with versed PRN  RASS goal: -2   FAMILY  - Updates:  No family at bedside    Freddrick March, MD  PGY-2, [facility] 05/15/2017 11:07 AM

## 2017-05-15 NOTE — Procedures (Signed)
Intubation Procedure Note Rhonda Haley [id number] 1966/07/09  Procedure: Intubation Indications: Respiratory insufficiency  Procedure Details Consent: Risks of procedure as well as the alternatives and risks of each were explained to the (patient/caregiver).  Consent for procedure obtained. Time Out: Verified patient identification, verified procedure, site/side was marked, verified correct patient position, special equipment/implants available, medications/allergies/relevent history reviewed, required imaging and test results available.  Performed  Maximum sterile technique was used including gloves and mask.  MAC and 3 Size 7.5 tube at 22cm at the lips   Evaluation Hemodynamic Status: BP stable throughout; O2 sats: stable throughout Patient's Current Condition: stable Complications: No apparent complications Patient did tolerate procedure well. Chest X-ray ordered to verify placement.  CXR: pending.   Rhonda Haley 05/15/2017

## 2017-05-15 NOTE — Progress Notes (Signed)
Pharmacy Antibiotic Note  Rhonda Haley is a 51 y.o. female admitted on 04/15/2017 with hepatic hydrothorax in setting of alcoholic cirrhosis s/p thoracentesis and treated for pneumonia for 14 days with cefepime (10 days with vancomycin). Patient experienced respiratory decompensation the PM of 10/7 requiring BiPAP and subsequent transfer to ICU and intubation the AM of 10/8. Decision was made to broaden therapy to [facility] for HCAP and worsening respiratory status. Pharmacy has been consulted for Merrem dosing.   SCr is rising, currently at 2.03, with estimated CrCl ~ 25 mL/min.  WBC is elevatd at 29.7. Patient is afebrile.   Plan: Start Merrem 1g IV every 12 hours.  Monitor renal function and need to adjust dose further.  Monitor culture results and ability to narrow therapy.   Height:  (172.7 cm) Weight: 111 lb 8.8 oz (50.6 kg) IBW/kg (Calculated) : 63.9  Temp (24hrs), Avg:98.2 F (36.8 C), Min:97.4 F (36.3 C), Max:98.7 F (37.1 C)   Recent Labs Lab 05/12/17 0323 05/13/17 0154 05/14/17 0236 05/14/17 0922 05/14/17 1137 05/15/17 0231  WBC 17.3* 16.7* 25.5* 29.7* 26.3* 29.7*  CREATININE 1.62* 1.44* 1.72* 1.85*  --  2.03*    Estimated Creatinine Clearance: 25.6 mL/min (A) (by C-G formula based on SCr of 2.03 mg/dL (H)).    No Known Allergies  Antimicrobials this admission: Zosyn x 1 on 9/25  Cefepime 9/25 >> 10/8 Vancomycin 9/25 >> 10/4 (10 days) Azithromycin 10/7 >> Merrem 10/8 >>  Dose adjustments this admission: 9/29 VT 27>>change to 1g q24h  Microbiology results: 10/8 Trach aspirate >>  10/8 Blood >>  10/6 BCx: sent  10/5 C. Diff: negative  10/2 MRSA neg  9/25 Influenza panel negative  9/25 HIV non-reactive  9/25 blood x 2 - NGTD  9/25 urine - multiple species, suggest recollection  9/26 right pleural fluid: ngF  9/?? MRSA PCR - pending  9/25 Strep pneuma Ag - negative  9/25 Legionella Ag - neg  Thank you for allowing pharmacy to be a part of this  patient's care.  Link Snuffer, PharmD, BCPS Clinical Pharmacist Clinical phone 05/15/2017 until 3:30PM [phone] After hours, please call [phone] 05/15/2017 1:17 PM

## 2017-05-15 NOTE — Progress Notes (Signed)
eLink Physician-Brief Progress Note Patient Name: Rhonda Haley DOB: 1965/10/28 MRN: [id number]   Date of Service  05/15/2017  HPI/Events of Note  RR remains in 40's on BiPAP.   eICU Interventions  Will order: 1. Transfer patient to ICU. 2. Dr. Minette Headland to re-evaluate patient on arrival.      Intervention Category Major Interventions: Respiratory failure - evaluation and management  Sommer,Steven Eugene 05/15/2017, 2:30 AM

## 2017-05-15 NOTE — Progress Notes (Signed)
Nutrition Consult / Follow-up  DOCUMENTATION CODES:   Severe malnutrition in context of chronic illness, Underweight  INTERVENTION:    Vital AF 1.2 at 40 ml/h (960 ml per day)  Pro-stat 30 ml BID  Provides 1352 kcal, 102 gm protein, 779 ml free water daily  NUTRITION DIAGNOSIS:   Malnutrition (Severe) related to chronic illness (ETOH cirrhosis) as evidenced by energy intake < 75% for > or equal to 1 month, severe depletion of body fat, severe depletion of muscle mass, moderate depletion of body fat, moderate depletions of muscle mass.  Ongoing  GOAL:   Patient will meet greater than or equal to 90% of their needs  Being addressed with TF  MONITOR:   Vent status, TF tolerance, Labs, I & O's  REASON FOR ASSESSMENT:   Consult Enteral/tube feeding initiation and management  ASSESSMENT:   51 y.o. female who presents with sepsis from HCAP in the setting of severe ETOH cirrhosis and recurrent pleural effusion. S/P thoracentesis on admission. Failed BiPAP and required intubation on 10/8.  Received MD Consult for TF initiation and management. Patient is currently intubated on ventilator support MV: 7.5 L/min Temp (24hrs), Avg:98.2 F (36.8 C), Min:97.4 F (36.3 C), Max:98.7 F (37.1 C)   Labs reviewed. Sodium 126 (L) Medications reviewed and include folic acid, lactulose, thiamine.  Diet Order:   NPO  Skin:  Reviewed, no issues  Last BM:  10/8  Height:   Ht Readings from Last 1 Encounters:  05/03/17  (1.727 m)    Weight:   Wt Readings from Last 1 Encounters:  05/15/17 111 lb 8.8 oz (50.6 kg)    Ideal Body Weight:  63.6 kg  BMI:  Body mass index is 16.96 kg/m.  Estimated Nutritional Needs:   Kcal:  1333  Protein:  90-105 grams  Fluid:  1.6-1.8 L  EDUCATION NEEDS:   Education needs addressed  Joaquin Courts, RD, LDN, CNSC Pager [phone] After Hours Pager [phone]

## 2017-05-15 NOTE — Progress Notes (Signed)
Pt transferred to ICU via bed, on BIPAP with RN and RT.   Husband at bedside.  Karena Addison T

## 2017-05-15 NOTE — Progress Notes (Signed)
PT Cancellation Note  Patient Details Name: Raneen Jaffer MRN: [id number] DOB: Oct 08, 1965   Cancelled Treatment:    Reason Eval/Treat Not Completed: Patient not medically ready Pt transferred back to ICU due to worsening in mental status and intubated early this AM. Will hold Pt today.   Blake Divine A Rhianon Zabawa 05/15/2017, 8:38 AM Mylo Red, PT, DPT [phone]

## 2017-05-15 NOTE — Progress Notes (Signed)
Attending:  I have seen and examined the patient with nurse practitioner/resident and agree with the note above.  We formulated the plan together and I elicited the following history.    Subjective: Intubated overnight Failed BIPAP Had been admitted for dyspnea, found to have a R pleural effusion   Objective: Vitals:   05/15/17 0428 05/15/17 0449 05/15/17 0725 05/15/17 0745  BP:      Pulse:    84  Resp:    14  Temp: 98.4 F (36.9 C)  98.3 F (36.8 C)   TempSrc: Oral  Oral   SpO2:  100%    Weight:      Height:       Vent Mode: PRVC FiO2 (%):  [40 %-60 %] 40 % Set Rate:  [14 bmp] 14 bmp Vt Set:  [520 mL] 520 mL PEEP:  [5 cmH20] 5 cmH20 Pressure Support:  [10 cmH20] 10 cmH20 Plateau Pressure:  [22 cmH20-24 cmH20] 22 cmH20  Intake/Output Summary (Last 24 hours) at 05/15/17 1129 Last data filed at 05/15/17 0700  Gross per 24 hour  Intake              730 ml  Output              175 ml  Net              555 ml    General:  In bed on vent HENT: NCAT ETT in place PULM: Rhonci B, vent supported breathing CV: RRR, no mgr GI: BS+, soft, nontender MSK: normal bulk and tone Neuro: sedated on vent     CBC    Component Value Date/Time   WBC 29.7 (H) 05/15/2017 0231   RBC 2.45 (L) 05/15/2017 0231   HGB 8.2 (L) 05/15/2017 0231   HCT 25.0 (L) 05/15/2017 0231   PLT 167 05/15/2017 0231   MCV 102.0 (H) 05/15/2017 0231   MCH 33.5 05/15/2017 0231   MCHC 32.8 05/15/2017 0231   RDW 15.4 05/15/2017 0231   LYMPHSABS 1.3 05/14/2017 1137   MONOABS 3.2 (H) 05/14/2017 1137   EOSABS 0.0 05/14/2017 1137   BASOSABS 0.0 05/14/2017 1137    BMET    Component Value Date/Time   NA 126 (L) 05/15/2017 0231   K 5.0 05/15/2017 0231   CL 92 (L) 05/15/2017 0231   CO2 20 (L) 05/15/2017 0231   GLUCOSE 90 05/15/2017 0231   BUN 76 (H) 05/15/2017 0231   CREATININE 2.03 (H) 05/15/2017 0231   CALCIUM 8.7 (L) 05/15/2017 0231   GFRNONAA 27 (L) 05/15/2017 0231   GFRAA 31 (L) 05/15/2017 0231     CXR images reviewed: trace apical R pneumothorax, bilateral airspace disease, ett in place  Impression/Plan: Acute respiratory failure with hypoxemia in setting of HCAP and a pleural effusion: continue full vent support, daily SBT, VAP prevention R pleural effusion: hepatic hydrothorax, would not perform more thoracentesis or chest tube further 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

## 2017-05-15 NOTE — Progress Notes (Signed)
Peripherally Inserted Central Catheter/Midline Placement  The IV Nurse has discussed with the patient and/or persons authorized to consent for the patient, the purpose of this procedure and the potential benefits and risks involved with this procedure.  The benefits include less needle sticks, lab draws from the catheter, and the patient may be discharged home with the catheter. Risks include, but not limited to, infection, bleeding, blood clot (thrombus formation), and puncture of an artery; nerve damage and irregular heartbeat and possibility to perform a PICC exchange if needed/ordered by physician.  Alternatives to this procedure were also discussed.  Bard Power PICC patient education guide, fact sheet on infection prevention and patient information card has been provided to patient /or left at bedside.    PICC/Midline Placement Documentation    Telephone consent by Husband    Rhonda Haley 05/15/2017, 9:17 AM

## 2017-05-15 NOTE — Progress Notes (Signed)
Due to worsening mental status and ongoing tachypnea on the BIPAP and no response to diuresis a decision was made to intubate the patient. Discussed with husband who was in agreement. He will go home and look thru her wishes to discuss goals of care.

## 2017-05-15 NOTE — Progress Notes (Signed)
[facility] called about pt bp still soft, RR still 40s.  Bipap not really improving much.  Will continue to monitor Rhonda Haley

## 2017-05-15 NOTE — Progress Notes (Signed)
Daily Rounding Note  05/15/2017, 12:55 PM  LOS: 12 days   SUBJECTIVE:   Chief complaint:    Intubated this AM   OBJECTIVE:         Vital signs in last 24 hours:    Temp:  [97.4 F (36.3 C)-98.7 F (37.1 C)] 97.9 F (36.6 C) (10/08 1132) Pulse Rate:  [84-124] 84 (10/08 0745) Resp:  [14-58] 14 (10/08 1213) BP: (73-104)/(38-57) 94/49 (10/08 1213) SpO2:  [96 %-100 %] 100 % (10/08 1213) FiO2 (%):  [40 %-60 %] 40 % (10/08 1213) Weight:  [50.6 kg (111 lb 8.8 oz)] 50.6 kg (111 lb 8.8 oz) (10/08 0341) Last BM Date: 05/15/17 Filed Weights   05/13/17 0611 05/14/17 0420 05/15/17 0341  Weight: 50.7 kg (111 lb 12.4 oz) 51.1 kg (112 lb 11.2 oz) 50.6 kg (111 lb 8.8 oz)   General: sedated on vent   Heart: RRR Chest: clear bil.  On vent Abdomen: soft, NT, ND.  Active BS  Extremities: no CCE Neuro/Psych:  Unresponsive to voice and exam.    Intake/Output from previous day: 10/07 0701 - 10/08 0700 In: 780 [I.V.:480; IV Piggyback:300] Out: 175 [Urine:175]  Intake/Output this shift: No intake/output data recorded.  Lab Results:  Recent Labs  05/14/17 0922 05/14/17 1137 05/15/17 0231  WBC 29.7* 26.3* 29.7*  HGB 9.0* 8.4* 8.2*  HCT 27.2* 25.3* 25.0*  PLT 155 145* 167   BMET  Recent Labs  05/14/17 0236 05/14/17 0922 05/15/17 0231  NA 125* 125* 126*  K 4.7 5.3* 5.0  CL 90* 92* 92*  CO2 23 21* 20*  GLUCOSE 122* 98 90  BUN 68* 74* 76*  CREATININE 1.72* 1.85* 2.03*  CALCIUM 8.7* 8.8* 8.7*   LFT No results for input(s): PROT, ALBUMIN, AST, ALT, ALKPHOS, BILITOT, BILIDIR, IBILI in the last 72 hours. PT/INR No results for input(s): LABPROT, INR in the last 72 hours. Hepatitis Panel No results for input(s): HEPBSAG, HCVAB, HEPAIGM, HEPBIGM in the last 72 hours.  Studies/Results: US Renal  Result Date: 05/14/2017 CLINICAL DATA:  Acute kidney injury EXAM: RENAL / URINARY TRACT ULTRASOUND COMPLETE COMPARISON:   03/19/2017 FINDINGS: Right Kidney: Length: 11.4 cm. Echogenicity within normal limits. No mass or hydronephrosis visualized. Left Kidney: Length: 11.6 cm. Echogenicity within normal limits. No mass or hydronephrosis visualized. Bladder: Appears normal for degree of bladder distention. Right pleural effusion. IMPRESSION: 1. Normal sonographic appearance of the kidneys. Electronically Signed   By: Amie Portland M.D.   On: 05/14/2017 09:12   Dg Chest Port 1 View  Result Date: 05/15/2017 CLINICAL DATA:  Intubation EXAM: PORTABLE CHEST 1 VIEW COMPARISON:  Chest radiograph 05/14/2017 FINDINGS: Endotracheal tube tip is at the level of the clavicular heads, in satisfactory position. There is diffuse reticular opacity throughout the left lung, unchanged. There is a large right hydropneumothorax that has decreased in size from the prior study. It now occupies approximately 1/3 of the right hemithorax. There is subcutaneous emphysema within the right axillary soft tissues. IMPRESSION: 1. Endotracheal tube tip in appropriate position at the level of the clavicular heads. 2. Slight decrease in size of right hydropneumothorax. Electronically Signed   By: Deatra Robinson M.D.   On: 05/15/2017 04:16   Dg Chest Port 1 View  Result Date: 05/14/2017 CLINICAL DATA:  Follow-up subacute onset of shortness of breath. Altered mental status. Initial encounter. EXAM: PORTABLE CHEST 1 VIEW COMPARISON:  Chest radiograph performed earlier today at 5:08 a.m. FINDINGS: A  moderate to large right-sided hydropneumothorax is perhaps mildly increased from the prior study. Right-sided soft tissue emphysema is again noted. There appears to be worsening bilateral interstitial and hazy airspace opacity. This is concerning for a combination of pneumonia and pulmonary edema. The cardiomediastinal silhouette is borderline normal in size. No acute osseous abnormalities are identified. IMPRESSION: 1. Moderate to large right-sided hydropneumothorax is  perhaps mildly increased in size from the prior study. 2. Worsening bilateral interstitial and hazy airspace opacity, concerning for a combination of pneumonia and pulmonary edema. 3. Right-sided soft tissue emphysema again noted. Electronically Signed   By: Roanna Raider M.D.   On: 05/14/2017 21:36   Dg Chest Port 1 View  Result Date: 05/14/2017 CLINICAL DATA:  Followup for hydropneumothorax. EXAM: PORTABLE CHEST 1 VIEW COMPARISON:  05/13/2017 FINDINGS: No significant change in right-sided hydropneumothorax. No left pneumothorax. Bilateral interstitial and hazy airspace opacities are stable. Right-sided subcutaneous emphysema has mildly increased since the previous day's study. IMPRESSION: 1. Mild increase in the right sided subcutaneous emphysema since the previous day's exam. No other change. 2. Persistent right-sided hydropneumothorax. 3. Persistent bilateral interstitial and hazy airspace lung opacities. A combination of infection and atelectasis suspected. Electronically Signed   By: Amie Portland M.D.   On: 05/14/2017 07:14   Dg Abd Portable 1v  Result Date: 05/15/2017 CLINICAL DATA:  Patient has undergone placement of an esophagogastric tube. EXAM: PORTABLE ABDOMEN - 1 VIEW COMPARISON:  No recent studies in [facility] FINDINGS: The orogastric tube tip and proximal port lie in the distal gastric body. The bowel gas pattern is within the limits of normal. There is a large right pleural effusion. IMPRESSION: Reasonable positioning of the esophagogastric tube. Electronically Signed   By: David  [place] M.D.   On: 05/15/2017 12:30   Scheduled Meds: . Chlorhexidine Gluconate Cloth  6 each Topical Daily  . feeding supplement (PRO-STAT SUGAR FREE 64)  30 mL Per Tube BID  . fentaNYL (SUBLIMAZE) injection  50 mcg Intravenous Once  . folic acid  1 mg Intravenous Daily  . lactulose  10 g Oral Daily  . mouth rinse  15 mL Mouth Rinse BID  . pantoprazole (PROTONIX) IV  40 mg Intravenous Q24H  . rifaximin  550  mg Per Tube BID  . sodium chloride flush  10-40 mL Intracatheter Q12H  . sodium chloride flush  3 mL Intravenous Q12H  . thiamine injection  100 mg Intravenous Daily   Continuous Infusions: . azithromycin Stopped (05/14/17 1247)  . feeding supplement (VITAL AF 1.2 CAL)    . fentaNYL infusion INTRAVENOUS 75 mcg/hr (05/15/17 0420)  . meropenem (MERREM) IV 1 g (05/15/17 1227)  . phenylephrine (NEO-SYNEPHRINE) Adult infusion 100 mcg/min (05/15/17 1122)   PRN Meds:.fentaNYL, fentaNYL (SUBLIMAZE) injection, fentaNYL (SUBLIMAZE) injection, LORazepam, LORazepam, midazolam, midazolam, ondansetron **OR** ondansetron (ZOFRAN) IV, sodium chloride flush  ASSESMENT:   *  Decompensated cirrhosis.    *  Right hepatic hydrothorax.  Chest tube placed 9/12- 10/5 for small post thoracentesis ptx.  *  Resp failure, placed on Bipap over weekend,  Intubated/VENT early this morning.  Growing pleural effusion not responsive to diuretics and renal function hindered high doses of these.   *  AKI.  Labs worse.    *  HE.  On Lactulose and Rifaximin.    *  Severe malnutrition, chronic.   Was on Ensure BID.  Now on Vital AF tube feeds at 30mL/hr, Prostat.      PLAN   *  Supportive care.  Has palliative care, GOC been offered to pt's spouse?     Rhonda Haley  05/15/2017, 12:55 PM Pager: [phone]   Attending physician's note   I have taken an interval history, reviewed the chart and examined the patient. I agree with the Advanced Practitioner's note, impression and recommendations.  Intubated. R hydropneumothorax. Persistently elevated WBC count concerning for possible infected hydrothorax/SBP Decompensated cirrhosis, MELD 24. Cr rising to 2.0 today Continue supportive care Overall poor prognosis  K Scherry Ran, MD [phone] Mon-Fri 8a-5p [phone] after 5p, weekends, holidays

## 2017-05-15 NOTE — Progress Notes (Signed)
Called Husband and update on pt.  Will be transferring to ICU.  Will continue to monitor. Rhonda Haley

## 2017-05-16 LAB — BASIC METABOLIC PANEL
ANION GAP: 10 (ref 5–15)
BUN: 85 mg/dL — AB (ref 6–20)
CHLORIDE: 97 mmol/L — AB (ref 101–111)
CO2: 24 mmol/L (ref 22–32)
Calcium: 7.5 mg/dL — ABNORMAL LOW (ref 8.9–10.3)
Creatinine, Ser: 2.04 mg/dL — ABNORMAL HIGH (ref 0.44–1.00)
GFR calc Af Amer: 31 mL/min — ABNORMAL LOW (ref >60)
GFR calc non Af Amer: 27 mL/min — ABNORMAL LOW (ref >60)
Glucose, Bld: 124 mg/dL — ABNORMAL HIGH (ref 65–99)
POTASSIUM: 4.5 mmol/L (ref 3.5–5.1)
SODIUM: 131 mmol/L — AB (ref 135–145)

## 2017-05-16 LAB — CBC
HCT: 23.8 % — ABNORMAL LOW (ref 36.0–46.0)
HEMOGLOBIN: 7.6 g/dL — AB (ref 12.0–15.0)
MCH: 33.3 pg (ref 26.0–34.0)
MCHC: 31.9 g/dL (ref 30.0–36.0)
MCV: 104.4 fL — ABNORMAL HIGH (ref 78.0–100.0)
Platelets: 192 10*3/uL (ref 150–400)
RBC: 2.28 MIL/uL — AB (ref 3.87–5.11)
RDW: 15.5 % (ref 11.5–15.5)
WBC: 30.2 10*3/uL — AB (ref 4.0–10.5)

## 2017-05-16 LAB — POCT I-STAT 3, ART BLOOD GAS (G3+)
Acid-base deficit: 8 mmol/L — ABNORMAL HIGH (ref 0.0–2.0)
Acid-base deficit: 3 mmol/L — ABNORMAL HIGH (ref 0.0–2.0)
Bicarbonate: 18.5 mmol/L — ABNORMAL LOW (ref 20.0–28.0)
Bicarbonate: 22.2 mmol/L (ref 20.0–28.0)
O2 Saturation: 90 %
O2 Saturation: 89 %
PCO2 ART: 40 mmHg (ref 32.0–48.0)
PCO2 ART: 38.6 mmHg (ref 32.0–48.0)
PH ART: 7.275 — AB (ref 7.350–7.450)
PH ART: 7.368 (ref 7.350–7.450)
PO2 ART: 67 mmHg — AB (ref 83.0–108.0)
PO2 ART: 58 mmHg — AB (ref 83.0–108.0)
Patient temperature: 98.9
Patient temperature: 98.9
TCO2: 20 mmol/L — ABNORMAL LOW (ref 22–32)
TCO2: 23 mmol/L (ref 22–32)

## 2017-05-16 LAB — GLUCOSE, CAPILLARY
GLUCOSE-CAPILLARY: 107 mg/dL — AB (ref 65–99)
GLUCOSE-CAPILLARY: 265 mg/dL — AB (ref 65–99)
Glucose-Capillary: 193 mg/dL — ABNORMAL HIGH (ref 65–99)
Glucose-Capillary: 239 mg/dL — ABNORMAL HIGH (ref 65–99)

## 2017-05-16 LAB — PHOSPHORUS: Phosphorus: 4.7 mg/dL — ABNORMAL HIGH (ref 2.5–4.6)

## 2017-05-16 LAB — MAGNESIUM: MAGNESIUM: 2.3 mg/dL (ref 1.7–2.4)

## 2017-05-16 LAB — LACTIC ACID, PLASMA: LACTIC ACID, VENOUS: 2.6 mmol/L — AB (ref 0.5–1.9)

## 2017-05-16 LAB — PROCALCITONIN: PROCALCITONIN: 3.8 ng/mL

## 2017-05-16 LAB — TROPONIN I
TROPONIN I: 0.13 ng/mL — AB (ref <0.03)
Troponin I: 0.03 ng/mL (ref <0.03)

## 2017-05-16 LAB — CORTISOL: CORTISOL PLASMA: 15.1 ug/dL

## 2017-05-16 MED ORDER — SODIUM CHLORIDE 0.9 % IV BOLUS (SEPSIS)
500.0000 mL | Freq: Once | INTRAVENOUS | Status: AC
Start: 2017-05-16 — End: 2017-05-16
  Administered 2017-05-16: 500 mL via INTRAVENOUS

## 2017-05-16 MED ORDER — HYDROCORTISONE NA SUCCINATE PF 100 MG IJ SOLR
50.0000 mg | Freq: Four times a day (QID) | INTRAMUSCULAR | Status: DC
  Administered 2017-05-16 – 2017-05-18 (×8): 50 mg via INTRAVENOUS
  Filled 2017-05-16 (×5): qty 1
  Filled 2017-05-16: qty 2
  Filled 2017-05-16 (×5): qty 1

## 2017-05-16 MED ORDER — STERILE WATER FOR INJECTION IV SOLN
INTRAVENOUS | Status: DC
  Administered 2017-05-16: 04:00:00 via INTRAVENOUS
  Filled 2017-05-16: qty 850

## 2017-05-16 MED ORDER — RIFAXIMIN 200 MG PO TABS
400.0000 mg | ORAL_TABLET | Freq: Three times a day (TID) | ORAL | Status: DC
  Administered 2017-05-16 – 2017-05-18 (×6): 400 mg
  Filled 2017-05-16 (×7): qty 2

## 2017-05-16 MED ORDER — HEPARIN SODIUM (PORCINE) 5000 UNIT/ML IJ SOLN
5000.0000 [IU] | Freq: Three times a day (TID) | INTRAMUSCULAR | Status: DC
  Administered 2017-05-16 – 2017-05-18 (×6): 5000 [IU] via SUBCUTANEOUS
  Filled 2017-05-16 (×6): qty 1

## 2017-05-16 MED ORDER — SODIUM BICARBONATE 8.4 % IV SOLN
50.0000 meq | Freq: Once | INTRAVENOUS | Status: AC
  Administered 2017-05-16: 50 meq via INTRAVENOUS
  Filled 2017-05-16: qty 50

## 2017-05-16 MED ORDER — NOREPINEPHRINE BITARTRATE 1 MG/ML IV SOLN
0.0000 ug/min | INTRAVENOUS | Status: DC
  Filled 2017-05-16: qty 16

## 2017-05-16 MED ORDER — NOREPINEPHRINE 16 MG/250ML-% IV SOLN
0.0000 ug/min | INTRAVENOUS | Status: DC
  Administered 2017-05-16: 10 ug/min via INTRAVENOUS
  Administered 2017-05-16: 28 ug/min via INTRAVENOUS
  Administered 2017-05-17: 16 ug/min via INTRAVENOUS
  Administered 2017-05-17: 28 ug/min via INTRAVENOUS
  Filled 2017-05-16 (×4): qty 250

## 2017-05-16 NOTE — Care Management Note (Signed)
Case Management Note  Patient Details  Name: Rhonda Haley MRN: [id number] Date of Birth: 1965-09-04  Subjective/Objective:   Pt admitted with Pnemothorax -Right pneumothorax s/p thoracentesis 9/29, now s/p chest tube placement 9/30                Action/Plan:  PTA from home with husband  but SNF recommended at discharge - CSW following.    Expected Discharge Date:                  Expected Discharge Plan:  Skilled Nursing Facility  In-House Referral:  Clinical Social Work  Discharge planning Services  CM Consult  Post Acute Care Choice:    Choice offered to:     DME Arranged:    DME Agency:     HH Arranged:    HH Agency:     Status of Service:     If discussed at [employer], dates discussed:    Additional Comments: 05/16/2017 Pt had to be transferred to ICU - now intubated. Remains intubated, increasing pressor requirement.  Overall poor prognosis.  GOC ongoing Cherylann Parr, RN 05/16/2017, 4:42 PM

## 2017-05-16 NOTE — Progress Notes (Signed)
Daily Rounding Note  05/16/2017, 1:02 PM  LOS: 13 days   SUBJECTIVE:   Family conference this AM with spouse, pt is DNR but for time being will remain intubated.    OBJECTIVE:         Vital signs in last 24 hours:    Temp:  [98.5 F (36.9 C)-99.7 F (37.6 C)] 99.2 F (37.3 C) (10/09 1102) Pulse Rate:  [80-109] 109 (10/09 1120) Resp:  [14-24] 20 (10/09 1120) BP: (79-114)/(38-57) 114/57 (10/09 1120) SpO2:  [95 %-100 %] 98 % (10/09 1120) FiO2 (%):  [40 %] 40 % (10/09 1120) Weight:  [55.6 kg (122 lb 9.2 oz)] 55.6 kg (122 lb 9.2 oz) (10/09 0500) Last BM Date: 05/15/17 Filed Weights   05/14/17 0420 05/15/17 0341 05/16/17 0500  Weight: 51.1 kg (112 lb 11.2 oz) 50.6 kg (111 lb 8.8 oz) 55.6 kg (122 lb 9.2 oz)   General: looks ill   Sedated on vent   Heart: RRR Chest: breathing  Non-labored on vent Abdomen: soft, slightly distended.  NT.  Active BS  Extremities: no CCE.  Thin and wasted limbs Neuro/Psych:  Sedated on vent.   Intake/Output from previous day: 10/08 0701 - 10/09 0700 In: 4259.4 [I.V.:3109.4; NG/GT:700; IV Piggyback:450] Out: 535 [Urine:535]  Intake/Output this shift: Total I/O In: 1483.7 [I.V.:1013.7; NG/GT:120; IV Piggyback:350] Out: 86 [Urine:86]  Lab Results:  Recent Labs  05/14/17 1137 05/15/17 0231 05/16/17 0444  WBC 26.3* 29.7* 30.2*  HGB 8.4* 8.2* 7.6*  HCT 25.3* 25.0* 23.8*  PLT 145* 167 192   BMET  Recent Labs  05/14/17 0922 05/15/17 0231 05/16/17 0444  NA 125* 126* 131*  K 5.3* 5.0 4.5  CL 92* 92* 97*  CO2 21* 20* 24  GLUCOSE 98 90 124*  BUN 74* 76* 85*  CREATININE 1.85* 2.03* 2.04*  CALCIUM 8.8* 8.7* 7.5*   LFT No results for input(s): PROT, ALBUMIN, AST, ALT, ALKPHOS, BILITOT, BILIDIR, IBILI in the last 72 hours. PT/INR No results for input(s): LABPROT, INR in the last 72 hours. Hepatitis Panel No results for input(s): HEPBSAG, HCVAB, HEPAIGM, HEPBIGM in the last  72 hours.  Studies/Results: Dg Chest Port 1 View  Result Date: 05/15/2017 CLINICAL DATA:  Intubation EXAM: PORTABLE CHEST 1 VIEW COMPARISON:  Chest radiograph 05/14/2017 FINDINGS: Endotracheal tube tip is at the level of the clavicular heads, in satisfactory position. There is diffuse reticular opacity throughout the left lung, unchanged. There is a large right hydropneumothorax that has decreased in size from the prior study. It now occupies approximately 1/3 of the right hemithorax. There is subcutaneous emphysema within the right axillary soft tissues. IMPRESSION: 1. Endotracheal tube tip in appropriate position at the level of the clavicular heads. 2. Slight decrease in size of right hydropneumothorax. Electronically Signed   By: Deatra Robinson M.D.   On: 05/15/2017 04:16   Dg Chest Port 1 View  Result Date: 05/14/2017 CLINICAL DATA:  Follow-up subacute onset of shortness of breath. Altered mental status. Initial encounter. EXAM: PORTABLE CHEST 1 VIEW COMPARISON:  Chest radiograph performed earlier today at 5:08 a.m. FINDINGS: A moderate to large right-sided hydropneumothorax is perhaps mildly increased from the prior study. Right-sided soft tissue emphysema is again noted. There appears to be worsening bilateral interstitial and hazy airspace opacity. This is concerning for a combination of pneumonia and pulmonary edema. The cardiomediastinal silhouette is borderline normal in size. No acute osseous abnormalities are identified. IMPRESSION: 1. Moderate to large right-sided  hydropneumothorax is perhaps mildly increased in size from the prior study. 2. Worsening bilateral interstitial and hazy airspace opacity, concerning for a combination of pneumonia and pulmonary edema. 3. Right-sided soft tissue emphysema again noted. Electronically Signed   By: Roanna Raider M.D.   On: 05/14/2017 21:36   Dg Abd Portable 1v  Result Date: 05/15/2017 CLINICAL DATA:  Patient has undergone placement of an  esophagogastric tube. EXAM: PORTABLE ABDOMEN - 1 VIEW COMPARISON:  No recent studies in [facility] FINDINGS: The orogastric tube tip and proximal port lie in the distal gastric body. The bowel gas pattern is within the limits of normal. There is a large right pleural effusion. IMPRESSION: Reasonable positioning of the esophagogastric tube. Electronically Signed   By: David  [place] M.D.   On: 05/15/2017 12:30   Scheduled Meds: . Chlorhexidine Gluconate Cloth  6 each Topical Daily  . feeding supplement (PRO-STAT SUGAR FREE 64)  30 mL Per Tube BID  . fentaNYL (SUBLIMAZE) injection  50 mcg Intravenous Once  . folic acid  1 mg Per Tube Daily  . heparin subcutaneous  5,000 Units Subcutaneous Q8H  . hydrocortisone sod succinate (SOLU-CORTEF) inj  50 mg Intravenous Q6H  . lactulose  10 g Oral Daily  . mouth rinse  15 mL Mouth Rinse BID  . pantoprazole (PROTONIX) IV  40 mg Intravenous Q24H  . rifaximin  400 mg Per Tube TID  . sodium chloride flush  10-40 mL Intracatheter Q12H  . sodium chloride flush  3 mL Intravenous Q12H  . thiamine  100 mg Per Tube Daily   Continuous Infusions: . azithromycin Stopped (05/16/17 1227)  . feeding supplement (VITAL AF 1.2 CAL) 1,000 mL (05/15/17 1321)  . fentaNYL infusion INTRAVENOUS 40 mcg/hr (05/16/17 0600)  . meropenem (MERREM) IV Stopped (05/16/17 1055)  . norepinephrine (LEVOPHED) Adult infusion 25 mcg/min (05/16/17 1245)  . phenylephrine (NEO-SYNEPHRINE) Adult infusion 75 mcg/min (05/16/17 1300)   PRN Meds:.fentaNYL, fentaNYL (SUBLIMAZE) injection, fentaNYL (SUBLIMAZE) injection, midazolam, midazolam, ondansetron **OR** ondansetron (ZOFRAN) IV, sodium chloride flush  ASSESMENT:   *  Decompensated cirrhosis.    *  Right hepatic hydrothorax.  Chest tube placed 9/12- 10/5 for small post thoracentesis ptx. Hydrothorax persists, diuretics of Lasix and aldactone not very effective and doses decreased and then stopped due to AKI.     *  Resp failure, placed on  Bipap over weekend,  Intubated/VENT early this morning.  Growing pleural effusion not responsive to diuretics and renal function hindered high doses of these.  On pressors, Levo and neo.    *  AKI.  Labs worse.    *  Coagulopathy, stable.    *  Hyponatremia, chronic, improved.   *  Macrocytic anemia.  Hgb down 1.5 gm in last 3 days.  FOBT negative on 10/4.    *  HE.  On Lactulose and Rifaximin.    *  Severe malnutrition, chronic.   Was on Ensure BID.  Now on Vital AF tube feeds at 64mL/hr, Prostat.       PLAN   *  Continue supportive care for now, reassess over next few days.  If trending worse overall, consider extubation and withdrawal of support.   *  GI will follow from a distance.   Call us if you need [place] actively involved.   Jennye Moccasin  05/16/2017, 1:02 PM Pager: [phone]   Attending physician's note   I have taken an interval history, reviewed the chart and examined  the patient. I agree with the Advanced Practitioner's note, impression and recommendations.  Remains intubated, increasing pressor requirement Overall poor prognosis Patient was made DNR after discussion with family Available for any questions   K Scherry Ran, MD [phone] Mon-Fri 8a-5p [phone] after 5p, weekends, holidays

## 2017-05-16 NOTE — Progress Notes (Signed)
OT Cancellation Note  Patient Details Name: Tiffanie Blassingame MRN: [id number] DOB: 01/11/1966   Cancelled Treatment:    Reason Eval/Treat Not Completed: Medical issues which prohibited therapy.  Pt intubated, not responsive.  Will check back to determine appropriateness.  Charlann Wayne [place], OTR/L [phone]   Jeani Hawking M 05/16/2017, 10:50 AM

## 2017-05-16 NOTE — Progress Notes (Signed)
PT Cancellation Note  Patient Details Name: Rhonda Haley MRN: [id number] DOB: [birth year]   Cancelled Treatment:    Reason Eval/Treat Not Completed: Patient not medically ready Per RN, pt not opening eyes or following any commands, not ready for PT today. Will follow up.   Blake Divine A Lidie Glade 05/16/2017, 10:11 AM Mylo Red, PT, DPT [phone]

## 2017-05-16 NOTE — Progress Notes (Signed)
In to see patient at this time and patient noted to have decreasing bp's in light of providing increased pressor support. Patients bp trending from  SBP 80-90's, DBP 40-50's with maps 55-57, NSR 90-95, 97 % Fi02 40. MD Arsenio Loader via Pola Corn notified at this time.  New orders obtained. Will continue to monitor the patient closely.

## 2017-05-16 NOTE — Progress Notes (Signed)
PULMONARY / CRITICAL CARE MEDICINE   Name: Rhonda Haley MRN: [id number] DOB: 03/15/1966    ADMISSION DATE:  05-13-17 CONSULTATION DATE:  05/12/2017   REFERRING MD:  Russella Dar  CHIEF COMPLAINT:  Fever, chills   HISTORY OF PRESENT ILLNESS:   51 year old with chronic liver disease who presented with fevers chills and hypotension. She had a significant white count with a left shift and she was started on empiric therapy for healthcare associated pneumonia with a combination of vancomycin and cefepime. She has a known history of prior hepatic pleural effusion. She had a very large right pleural effusion on this admission which was drained with a pigtail catheter on September 30. That fluid has shown no growth and had a LDH of only 34 a pH of 8.1 and only 24 white blood cells. The pigtail catheter was subsequently removed. CT with the pigtail in place showed a right hydropneumothorax and no loculation along with diffuse pulmonary infiltrates. Worsening respiratory status on Bipap was intubated and transferred to ICU on morning of 10/8.    REVIEW OF SYSTEMS:   No fevers, chills.  No rigors.   SUBJECTIVE:  Neo increased to 300 overnight for labile pressures.  Started on bicarb drip gtt as acidotic on ABG.    VITAL SIGNS: BP (!) 92/47   Pulse (!) 101   Temp 98.9 F (37.2 C) (Axillary)   Resp 19   Ht  (1.727 m)   Wt 122 lb 9.2 oz (55.6 kg)   SpO2 98%   BMI 18.64 kg/m   HEMODYNAMICS:    VENTILATOR SETTINGS: Vent Mode: PRVC FiO2 (%):  [40 %] 40 % Set Rate:  [14 bmp] 14 bmp Vt Set:  [520 mL] 520 mL PEEP:  [5 cmH20] 5 cmH20 Plateau Pressure:  [20 cmH20-24 cmH20] 21 cmH20  INTAKE / OUTPUT: I/O last 3 completed shifts: In: 4534.9 [I.V.:3424.9; NG/GT:660; IV Piggyback:450] Out: 710 [Urine:710]  PHYSICAL EXAMINATION: General: thin, on vent, awakens to sternal rub  Cardiovascular: grade 3/6 systolic murmur, 2+ pedal pulses bilaterally  Lungs: coarse lung sounds, no wheeze  Abdomen:  soft, NTND, mild distention, no fluid wave present , +bs  Neuro:  does not follow commands   LABS: C diff negative   BMET  Recent Labs Lab 05/14/17 0922 05/15/17 0231 05/16/17 0444  NA 125* 126* 131*  K 5.3* 5.0 4.5  CL 92* 92* 97*  CO2 21* 20* 24  BUN 74* 76* 85*  CREATININE 1.85* 2.03* 2.04*  GLUCOSE 98 90 124*    Electrolytes  Recent Labs Lab 05/10/17 0225  05/14/17 0922 05/15/17 0231 05/15/17 1607 05/16/17 0444  CALCIUM 8.9  < > 8.8* 8.7*  --  7.5*  MG 2.1  --   --   --  2.3 2.3  PHOS 3.7  --   --   --  5.5* 4.7*  < > = values in this interval not displayed.  CBC  Recent Labs Lab 05/14/17 1137 05/15/17 0231 05/16/17 0444  WBC 26.3* 29.7* 30.2*  HGB 8.4* 8.2* 7.6*  HCT 25.3* 25.0* 23.8*  PLT 145* 167 192    Coag's  Recent Labs Lab 05/12/17 0323  INR 1.52   Sepsis Markers  Recent Labs Lab 05/12/17 1501 05/13/17 0154 05/14/17 0236  PROCALCITON 0.54 0.56 0.58   ABG  Recent Labs Lab 05/14/17 2034 05/16/17 0349 05/16/17 0533  PHART 7.503* 7.275* 7.368  PCO2ART 27.8* 40.0 38.6  PO2ART 47.7* 67.0* 58.0*   Liver Enzymes  Recent Labs Lab 05/10/17  0225 05/12/17 0323  AST 75* 82*  ALT 82* 90*  ALKPHOS 101 107  BILITOT 4.9* 5.7*  ALBUMIN 1.7* 1.8*   Cardiac Enzymes No results for input(s): TROPONINI, PROBNP in the last 168 hours.  Glucose  Recent Labs Lab 05/15/17 0426 05/15/17 1129  GLUCAP 123* 99   Imaging Dg Abd Portable 1v  Result Date: 05/15/2017 CLINICAL DATA:  Patient has undergone placement of an esophagogastric tube. EXAM: PORTABLE ABDOMEN - 1 VIEW COMPARISON:  No recent studies in [facility] FINDINGS: The orogastric tube tip and proximal port lie in the distal gastric body. The bowel gas pattern is within the limits of normal. There is a large right pleural effusion. IMPRESSION: Reasonable positioning of the esophagogastric tube. Electronically Signed   By: David  [place] M.D.   On: 05/15/2017 12:30   STUDIES:  -CXR  10/7 > mod to large R side hydropneumo, worsening bilateral interstitial and hazy airspace opacity concerning for a combination of pneumonia and pulmonary edema  -CXR 10/8 > ETT in good position, slight decrease in size of right hydropneumothorax   -ABG 10/9 pH 7/27 > improved to 7.38 after 2 hours   CULTURES: Resp cx 10/8 >>  Bcx 10/8 >>  ANTIBIOTICS: Azithro 10/7 >  Meropenem 10/8>  Cefepime 10/8 > 10/8  SIGNIFICANT EVENTS: ETT 10/8 for worsening resp status on Bipap    LINES/TUBES: -ETT 10/8 >  -double lumen 10/8>  -PIV R arm 10/7 -PIV L posterior forearm 10/8  ASSESSMENT / PLAN:  PULMONARY Acute respiratory failure requiring mechanical ventilation Initially on Bipap but with worsening resp status  CXR with decrease in size of R hydropneumothorax  Acidotic on ABG > started bicarb gtt   CARDIOVASCULAR IV Lasix to diurese Monitor SCr with lasix  Neo increased to 350 this morning for labile pressures Goal MAP>65   RENAL Replete electrolytes as needed  Foley cath  Acidotic on ABG > started bicarb gtt  Monitor SCr as it is rising (2.04 this AM)   GASTROINTESTINAL H/o alcoholic cirrhosis with ascites  Decompensated cirrhosis with MELD 24 Continue lactulose and Rifaximin  NPO while vented Continue TF  Protonix Per GI consult - supportive care for overall poor prognosis, consider palliative   HEMATOLOGIC Normocytic anemia with Hgb 8.2  No active bleeding Continue to monitor   INFECTIOUS PICC line placed 10/8  Continue azithro  Meropenem added 10/8  Persistently elevated WBC count concerning for possible infected hydrothorax/SBP   ENDOCRINE No h/o diabetes  Monitor CBGs   NEUROLOGIC Holding home Lexapro  Requiring mechanical ventilation  Fentanyl with versed PRN  RASS goal: -2   FAMILY  - Updates:  No family at bedside  Patient remains full code >> will need to get in touch with husband today.  Consider consult to palliative for GOC.    Freddrick March, MD  PGY-2, [facility] 05/16/2017 7:40 AM

## 2017-05-16 NOTE — Plan of Care (Signed)
  Interdisciplinary Goals of Care Family Meeting   Date carried out:: 05/16/2017  Location of the meeting: Conference room  Member's involved: Physician, Bedside Registered Nurse and Family Member or next of kin  Durable Power of Attorney or acting medical decision maker: Husband Russia Scheiderer  Discussion: We discussed goals of care for [employer] .  She is in worsening shock with multiorgan failure likely from HCAP, sepsis in the setting of decompensated liver failure. Her prognosis is grim.  Mr. Garramone stated that the family been preparing for this situation. He wants her to be DO NOT RESUSCITATE. We will continue current support with no escalation. He'll try to get in touch with his in-laws in [place]. We'll regroup in 24- 48 hours see if she has made any progress. If there is no change in status or worsening we will likely transition to comfort care.  Code status: Limited Code or DNR with short term and Mechanical ventilation  Disposition: Continue current acute care  Time spent for the meeting: 15 mins  Demba Nigh 05/16/2017, 12:43 PM

## 2017-05-17 ENCOUNTER — Inpatient Hospital Stay (HOSPITAL_COMMUNITY): Payer: 59

## 2017-05-17 DIAGNOSIS — L8990 Pressure ulcer of unspecified site, unspecified stage: Secondary | ICD-10-CM | POA: Insufficient documentation

## 2017-05-17 LAB — CBC
HCT: 25.3 % — ABNORMAL LOW (ref 36.0–46.0)
HEMOGLOBIN: 8.1 g/dL — AB (ref 12.0–15.0)
MCH: 33.3 pg (ref 26.0–34.0)
MCHC: 32 g/dL (ref 30.0–36.0)
MCV: 104.1 fL — ABNORMAL HIGH (ref 78.0–100.0)
PLATELETS: 191 10*3/uL (ref 150–400)
RBC: 2.43 MIL/uL — ABNORMAL LOW (ref 3.87–5.11)
RDW: 15.9 % — ABNORMAL HIGH (ref 11.5–15.5)
WBC: 29.8 10*3/uL — ABNORMAL HIGH (ref 4.0–10.5)

## 2017-05-17 LAB — CULTURE, RESPIRATORY

## 2017-05-17 LAB — GLUCOSE, CAPILLARY
GLUCOSE-CAPILLARY: 193 mg/dL — AB (ref 65–99)
GLUCOSE-CAPILLARY: 206 mg/dL — AB (ref 65–99)
Glucose-Capillary: 205 mg/dL — ABNORMAL HIGH (ref 65–99)
Glucose-Capillary: 191 mg/dL — ABNORMAL HIGH (ref 65–99)

## 2017-05-17 LAB — COMPREHENSIVE METABOLIC PANEL
ALT: 48 U/L (ref 14–54)
AST: 53 U/L — AB (ref 15–41)
Albumin: 2.1 g/dL — ABNORMAL LOW (ref 3.5–5.0)
Alkaline Phosphatase: 127 U/L — ABNORMAL HIGH (ref 38–126)
Anion gap: 11 (ref 5–15)
BUN: 92 mg/dL — ABNORMAL HIGH (ref 6–20)
CHLORIDE: 99 mmol/L — AB (ref 101–111)
CO2: 20 mmol/L — ABNORMAL LOW (ref 22–32)
Calcium: 7.6 mg/dL — ABNORMAL LOW (ref 8.9–10.3)
Creatinine, Ser: 1.98 mg/dL — ABNORMAL HIGH (ref 0.44–1.00)
GFR, EST AFRICAN AMERICAN: 32 mL/min — AB (ref >60)
GFR, EST NON AFRICAN AMERICAN: 28 mL/min — AB (ref >60)
Glucose, Bld: 243 mg/dL — ABNORMAL HIGH (ref 65–99)
POTASSIUM: 4.9 mmol/L (ref 3.5–5.1)
SODIUM: 130 mmol/L — AB (ref 135–145)
Total Bilirubin: 5.4 mg/dL — ABNORMAL HIGH (ref 0.3–1.2)
Total Protein: 6 g/dL — ABNORMAL LOW (ref 6.5–8.1)

## 2017-05-17 LAB — TROPONIN I: TROPONIN I: 0.1 ng/mL — AB (ref <0.03)

## 2017-05-17 LAB — LACTIC ACID, PLASMA: Lactic Acid, Venous: 1.9 mmol/L (ref 0.5–1.9)

## 2017-05-17 LAB — PROCALCITONIN: PROCALCITONIN: 5.68 ng/mL

## 2017-05-17 LAB — MAGNESIUM: MAGNESIUM: 2.4 mg/dL (ref 1.7–2.4)

## 2017-05-17 LAB — PHOSPHORUS: PHOSPHORUS: 5.5 mg/dL — AB (ref 2.5–4.6)

## 2017-05-17 SURGERY — COLONOSCOPY
Anesthesia: Monitor Anesthesia Care

## 2017-05-17 MED ORDER — INSULIN GLARGINE 100 UNIT/ML ~~LOC~~ SOLN
5.0000 [IU] | Freq: Every day | SUBCUTANEOUS | Status: DC
  Administered 2017-05-17 – 2017-05-18 (×2): 5 [IU] via SUBCUTANEOUS
  Filled 2017-05-17 (×2): qty 0.05

## 2017-05-17 MED ORDER — ORAL CARE MOUTH RINSE
15.0000 mL | Freq: Four times a day (QID) | OROMUCOSAL | Status: DC
  Administered 2017-05-17 – 2017-05-18 (×6): 15 mL via OROMUCOSAL

## 2017-05-17 MED ORDER — INSULIN ASPART 100 UNIT/ML ~~LOC~~ SOLN
0.0000 [IU] | SUBCUTANEOUS | Status: DC
  Administered 2017-05-17: 2 [IU] via SUBCUTANEOUS
  Administered 2017-05-17 (×2): 3 [IU] via SUBCUTANEOUS
  Administered 2017-05-17: 2 [IU] via SUBCUTANEOUS
  Administered 2017-05-17: 3 [IU] via SUBCUTANEOUS
  Administered 2017-05-18 (×3): 2 [IU] via SUBCUTANEOUS
  Administered 2017-05-18: 3 [IU] via SUBCUTANEOUS

## 2017-05-17 MED ORDER — CHLORHEXIDINE GLUCONATE 0.12% ORAL RINSE (MEDLINE KIT)
15.0000 mL | Freq: Two times a day (BID) | OROMUCOSAL | Status: DC
  Administered 2017-05-17 – 2017-05-18 (×3): 15 mL via OROMUCOSAL

## 2017-05-17 NOTE — Progress Notes (Signed)
PULMONARY / CRITICAL CARE MEDICINE   Name: Rhonda Haley MRN: [id number] DOB: Mar 16, 1966    ADMISSION DATE:  05/07/2017 CONSULTATION DATE:  05/12/2017   REFERRING MD:  Russella Dar  CHIEF COMPLAINT:  Fever, chills   HISTORY OF PRESENT ILLNESS:   51 year old with chronic liver disease who presented with fevers chills and hypotension. She had a significant white count with a left shift and she was started on empiric therapy for healthcare associated pneumonia with a combination of vancomycin and cefepime. She has a known history of prior hepatic pleural effusion. She had a very large right pleural effusion on this admission which was drained with a pigtail catheter on September 30. That fluid has shown no growth and had a LDH of only 34 a pH of 8.1 and only 24 white blood cells. The pigtail catheter was subsequently removed. CT with the pigtail in place showed a right hydropneumothorax and no loculation along with diffuse pulmonary infiltrates. Worsening respiratory status on Bipap was intubated and transferred to ICU on morning of 10/8.    REVIEW OF SYSTEMS:   No fevers, chills.  No rigors.   SUBJECTIVE:  Improvement in blood pressures overnight on Levophed.   Has remained afebrile with no acute events noted.   VITAL SIGNS: BP (!) 115/59   Pulse (!) 109   Temp 98.2 F (36.8 C) (Oral)   Resp 18   Ht  (1.727 m)   Wt 130 lb 4.7 oz (59.1 kg)   SpO2 92%   BMI 19.81 kg/m   HEMODYNAMICS: CVP:  [7 mmHg] 7 mmHg  VENTILATOR SETTINGS: Vent Mode: PRVC FiO2 (%):  [40 %-55 %] 55 % Set Rate:  [14 bmp] 14 bmp Vt Set:  [520 mL] 520 mL PEEP:  [5 cmH20] 5 cmH20 Plateau Pressure:  [21 cmH20-25 cmH20] 22 cmH20  INTAKE / OUTPUT: I/O last 3 completed shifts: In: 5553 [I.V.:3043; NG/GT:1460; IV Piggyback:1050] Out: 741 [Urine:741]  PHYSICAL EXAMINATION: General: thin, on vent, awakens to sternal rub, opens eyes, NAD  Cardiovascular: grade 3/6 systolic murmur, 2+ pedal pulses bilaterally  Lungs:  coarse, no wheeze  Abdomen: soft, NTND, mild distention, no fluid wave present , +bs  Neuro: awakens on exam, able to follow commands, wiggles toes   LABS: C diff negative  Trop elevated 0.03>0.13>0.10  Cortisol 15.1 wnl  LA elev 2.6>1.9  Procal 3.80>5.68 Na 130  Mag 2.4 wnl  Phos 5.5 elev   BMET  Recent Labs Lab 05/15/17 0231 05/16/17 0444 05/17/17 0140  NA 126* 131* 130*  K 5.0 4.5 4.9  CL 92* 97* 99*  CO2 20* 24 20*  BUN 76* 85* 92*  CREATININE 2.03* 2.04* 1.98*  GLUCOSE 90 124* 243*    Electrolytes  Recent Labs Lab 05/15/17 0231 05/15/17 1607 05/16/17 0444 05/17/17 0140  CALCIUM 8.7*  --  7.5* 7.6*  MG  --  2.3 2.3 2.4  PHOS  --  5.5* 4.7* 5.5*    CBC  Recent Labs Lab 05/15/17 0231 05/16/17 0444 05/17/17 0547  WBC 29.7* 30.2* 29.8*  HGB 8.2* 7.6* 8.1*  HCT 25.0* 23.8* 25.3*  PLT 167 192 191    Coag's  Recent Labs Lab 05/12/17 0323  INR 1.52   Sepsis Markers  Recent Labs Lab 05/14/17 0236 05/16/17 1233 05/16/17 1235 05/17/17 0127 05/17/17 0140  LATICACIDVEN  --   --  2.6* 1.9  --   PROCALCITON 0.58 3.80  --   --  5.68   ABG  Recent Labs Lab 05/14/17  2034 05/16/17 0349 05/16/17 0533  PHART 7.503* 7.275* 7.368  PCO2ART 27.8* 40.0 38.6  PO2ART 47.7* 67.0* 58.0*   Liver Enzymes  Recent Labs Lab 05/12/17 0323 05/17/17 0140  AST 82* 53*  ALT 90* 48  ALKPHOS 107 127*  BILITOT 5.7* 5.4*  ALBUMIN 1.8* 2.1*   Cardiac Enzymes  Recent Labs Lab 05/16/17 1233 05/16/17 1820 05/17/17 0127  TROPONINI 0.03* 0.13* 0.10*    Glucose  Recent Labs Lab 05/15/17 1129 05/16/17 1100 05/16/17 1459 05/16/17 2006 05/16/17 2345 05/17/17 0406  GLUCAP 99 107* 193* 239* 265* 205*   IMAGING STUDIES:  -CXR 10/7 > mod to large R side hydropneumo, worsening bilateral interstitial and hazy airspace opacity concerning for a combination of pneumonia and pulmonary edema  -CXR 10/8 > ETT in good position, slight decrease in size of  right hydropneumothorax   -ABG 10/9 pH 7/27 > improved to 7.38 after 2 hours  -CXR 10/10 >> f/u   CULTURES: Resp cx 10/8 >> rare budding yeast, cx reincubated for better growth  Bcx 10/8 >> NGx2D   ANTIBIOTICS: Azithro 10/7 >> Meropenem 10/8>> Cefepime 10/8 > 10/8  SIGNIFICANT EVENTS: ETT 10/8 for worsening resp status on Bipap    LINES/TUBES: -ETT 10/8 >  -double lumen 10/8>  -PIV R arm 10/7 -PIV L posterior forearm 10/8 -PICC line  10/8   ASSESSMENT / PLAN:  PULMONARY Acute respiratory failure requiring mechanical ventilation  CXR with decrease in size of R hydropneumothorax  S/p bicarb gtt 10/9 to correct acidemia on ABG   CARDIOVASCULAR Hypotension Levophed started 10/9, wean off neo  Goal MAP>65  CVP monitoring  Elevated troponins   RENAL Foley cath  Monitor SCr as it is rising (2.04>1.98 this AM)   Hyponatremia improving  Replete electrolytes as needed   GASTROINTESTINAL H/o alcoholic cirrhosis with ascites  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  MELD score 24 and poor prognosis  Coagulopathy is stable  Continue lactulose and Rifaximin  Follow LFTs  NPO while vented- Continue TF  Protonix  GI following  Continuing supportive care for overall poor prognosis   HEMATOLOGIC H/o macrocytic anemia  FOBT neg on 10/4  No active bleeding Continue to monitor   INFECTIOUS Septic shock  Normal cortisol and resolved lactic acidosis  Continue stress dose steroids - Solucortef  Continue Azithro and Meropenem Persistently elevated WBC count concerning for possible infected hydrothorax/SBP    ENDOCRINE  No h/o diabetes  Random blood cortisol level wnl  Continue Solucortef 50 mg IV Q6  CBG elevation in 200-260 range  May consider adding Lantus for better control   NEUROLOGIC Holding home Lexapro  Requiring mechanical ventilation  Fentanyl with versed PRN  RASS goal: -2   FAMILY  - Updates:  Discussed poor prognosis with  husband in GOC family meeting yesterday > pt code status has been changed to DNR and will continue current support with no escalation.  Patient's husband in agreement that if no progress after 24-48 hours may want to transition to comfort care.     Freddrick March, MD  PGY-2, [facility] 05/17/2017 7:49 AM

## 2017-05-18 ENCOUNTER — Inpatient Hospital Stay (HOSPITAL_COMMUNITY): Payer: 59

## 2017-05-18 DIAGNOSIS — J9601 Acute respiratory failure with hypoxia: Secondary | ICD-10-CM

## 2017-05-18 LAB — GLUCOSE, CAPILLARY
GLUCOSE-CAPILLARY: 196 mg/dL — AB (ref 65–99)
GLUCOSE-CAPILLARY: 207 mg/dL — AB (ref 65–99)
GLUCOSE-CAPILLARY: 164 mg/dL — AB (ref 65–99)
GLUCOSE-CAPILLARY: 181 mg/dL — AB (ref 65–99)
Glucose-Capillary: 201 mg/dL — ABNORMAL HIGH (ref 65–99)

## 2017-05-18 LAB — PROCALCITONIN: PROCALCITONIN: 4.46 ng/mL

## 2017-05-18 LAB — COMPREHENSIVE METABOLIC PANEL
ALK PHOS: 129 U/L — AB (ref 38–126)
ALT: 46 U/L (ref 14–54)
ANION GAP: 11 (ref 5–15)
AST: 61 U/L — ABNORMAL HIGH (ref 15–41)
Albumin: 2 g/dL — ABNORMAL LOW (ref 3.5–5.0)
BUN: 102 mg/dL — ABNORMAL HIGH (ref 6–20)
CHLORIDE: 100 mmol/L — AB (ref 101–111)
CO2: 21 mmol/L — AB (ref 22–32)
CREATININE: 1.7 mg/dL — AB (ref 0.44–1.00)
Calcium: 7.7 mg/dL — ABNORMAL LOW (ref 8.9–10.3)
GFR, EST AFRICAN AMERICAN: 39 mL/min — AB (ref >60)
GFR, EST NON AFRICAN AMERICAN: 33 mL/min — AB (ref >60)
Glucose, Bld: 203 mg/dL — ABNORMAL HIGH (ref 65–99)
Potassium: 4.7 mmol/L (ref 3.5–5.1)
SODIUM: 132 mmol/L — AB (ref 135–145)
Total Bilirubin: 4.9 mg/dL — ABNORMAL HIGH (ref 0.3–1.2)
Total Protein: 6.2 g/dL — ABNORMAL LOW (ref 6.5–8.1)

## 2017-05-18 LAB — MAGNESIUM: Magnesium: 2.5 mg/dL — ABNORMAL HIGH (ref 1.7–2.4)

## 2017-05-18 LAB — PHOSPHORUS: PHOSPHORUS: 5.2 mg/dL — AB (ref 2.5–4.6)

## 2017-05-18 LAB — CBC
HCT: 24 % — ABNORMAL LOW (ref 36.0–46.0)
HEMOGLOBIN: 7.6 g/dL — AB (ref 12.0–15.0)
MCH: 32.6 pg (ref 26.0–34.0)
MCHC: 31.7 g/dL (ref 30.0–36.0)
MCV: 103 fL — ABNORMAL HIGH (ref 78.0–100.0)
PLATELETS: 157 10*3/uL (ref 150–400)
RBC: 2.33 MIL/uL — AB (ref 3.87–5.11)
RDW: 15.5 % (ref 11.5–15.5)
WBC: 29.1 10*3/uL — AB (ref 4.0–10.5)

## 2017-05-18 MED ORDER — SODIUM CHLORIDE 0.9 % IV SOLN
10.0000 mg/h | INTRAVENOUS | Status: DC
  Administered 2017-05-18: 1 mg/h via INTRAVENOUS
  Filled 2017-05-18: qty 10

## 2017-05-18 MED ORDER — SODIUM CHLORIDE 0.9 % IV SOLN
10.0000 mg/h | INTRAVENOUS | Status: DC
  Filled 2017-05-18: qty 10

## 2017-05-18 MED ORDER — MORPHINE BOLUS VIA INFUSION
5.0000 mg | INTRAVENOUS | Status: DC | PRN
  Administered 2017-05-18: 5 mg via INTRAVENOUS
  Filled 2017-05-18: qty 20

## 2017-05-19 ENCOUNTER — Telehealth: Payer: Self-pay

## 2017-05-19 LAB — CULTURE, BLOOD (ROUTINE X 2)
Culture: NO GROWTH
Culture: NO GROWTH
SPECIAL REQUESTS: ADEQUATE

## 2017-05-19 NOTE — Telephone Encounter (Signed)
On 05/19/17 I received a d/c from [facility] (original). The d/c is for cremation. The patient is a patient of Doctor Maneem. The d/c will be taken to [facility] ([address]) this pm for signature.  On 05/22/17 I received the d/c back from Doctor Maneem. I got the d/c ready and called the funeral home to let them know the d/c is ready to be picked up. I also faxed a copy to the funeral home per the funeral home request.

## 2017-06-08 NOTE — Progress Notes (Signed)
Pt extubated to room air per withdrawal order. RN in pt's room.

## 2017-06-08 NOTE — Progress Notes (Signed)
CSW acknowledges consult for comfort care. CSW will continue to follow for support.     Claude Manges Levi Crass, MSW, LCSW-A Emergency Department Clinical Social Worker [phone]

## 2017-06-08 NOTE — Progress Notes (Signed)
PULMONARY / CRITICAL CARE MEDICINE   Name: Rhonda Haley MRN: [id number] DOB: 25-Aug-1965    ADMISSION DATE:  05/01/2017 CONSULTATION DATE:  05/12/2017   REFERRING MD:  Russella Dar  CHIEF COMPLAINT:  Fever, chills   HISTORY OF PRESENT ILLNESS:   51 year old with chronic liver disease who presented with fevers chills and hypotension. She had a significant white count with a left shift and she was started on empiric therapy for healthcare associated pneumonia with a combination of vancomycin and cefepime. She has a known history of prior hepatic pleural effusion. She had a very large right pleural effusion on this admission which was drained with a pigtail catheter on September 30. That fluid has shown no growth and had a LDH of only 34 a pH of 8.1 and only 24 white blood cells. The pigtail catheter was subsequently removed. CT with the pigtail in place showed a right hydropneumothorax and no loculation along with diffuse pulmonary infiltrates. Worsening respiratory status on Bipap was intubated and transferred to ICU on morning of 10/8.    REVIEW OF SYSTEMS:   No fevers, chills.  No rigors.   SUBJECTIVE:  Blood pressures continue to be stable overnight with systolics in the low 100s. On Levophed.  Has remained afebrile with no acute events noted.   VITAL SIGNS: BP (!) 96/46   Pulse (!) 109   Temp 98.4 F (36.9 C) (Oral)   Resp (!) 22   Ht  (1.727 m)   Wt 135 lb 2.3 oz (61.3 kg)   SpO2 93%   BMI 20.55 kg/m   HEMODYNAMICS: CVP:  [8 mmHg-12 mmHg] 8 mmHg  VENTILATOR SETTINGS: Vent Mode: PRVC FiO2 (%):  [55 %-65 %] 65 % Set Rate:  [14 bmp] 14 bmp Vt Set:  [520 mL] 520 mL PEEP:  [5 cmH20] 5 cmH20 Plateau Pressure:  [19 cmH20-25 cmH20] 22 cmH20  INTAKE / OUTPUT: I/O last 3 completed shifts: In: 2606.4 [I.V.:756.4; NG/GT:1400; IV Piggyback:450] Out: 1072 [Urine:1022; Stool:50]  PHYSICAL EXAMINATION: General: thin, remains on vent, awakens to sternal rub and opens eyes   Cardiovascular: tachycardic, grade 3/6 systolic murmur  Lungs: coarse lung sounds, no wheeze  Abdomen: soft, NTND, mild distention, +bs  Neuro: awakens on exam, able to follow commands, wiggles toes   LABS: C diff negative  Trop elevated 0.03>0.13>0.10  Cortisol 15.1 wnl  LA elev 2.6>1.9  Procal 3.80>5.68>  Na 130>132 SCr 1.98>1.70  Mag 2.5 wnl  Phos 5.2 elev  AST 53>61 ALT 48>46   BMET  Recent Labs Lab 05/16/17 0444 05/17/17 0140 05/10/2017 0528  NA 131* 130* 132*  K 4.5 4.9 4.7  CL 97* 99* 100*  CO2 24 20* 21*  BUN 85* 92* 102*  CREATININE 2.04* 1.98* 1.70*  GLUCOSE 124* 243* 203*   Electrolytes  Recent Labs Lab 05/16/17 0444 05/17/17 0140 05/22/2017 0528  CALCIUM 7.5* 7.6* 7.7*  MG 2.3 2.4 2.5*  PHOS 4.7* 5.5* 5.2*   CBC  Recent Labs Lab 05/16/17 0444 05/17/17 0547 05/30/2017 0528  WBC 30.2* 29.8* 29.1*  HGB 7.6* 8.1* 7.6*  HCT 23.8* 25.3* 24.0*  PLT 192 191 157   Coag's  Recent Labs Lab 05/12/17 0323  INR 1.52   Sepsis Markers  Recent Labs Lab 05/16/17 1233 05/16/17 1235 05/17/17 0127 05/17/17 0140 05/17/2017 0528  LATICACIDVEN  --  2.6* 1.9  --   --   PROCALCITON 3.80  --   --  5.68 4.46   ABG  Recent Labs Lab 05/14/17 2034  05/16/17 0349 05/16/17 0533  PHART 7.503* 7.275* 7.368  PCO2ART 27.8* 40.0 38.6  PO2ART 47.7* 67.0* 58.0*   Liver Enzymes  Recent Labs Lab 05/12/17 0323 05/17/17 0140 May 22, 2017 0528  AST 82* 53* 61*  ALT 90* 48 46  ALKPHOS 107 127* 129*  BILITOT 5.7* 5.4* 4.9*  ALBUMIN 1.8* 2.1* 2.0*   Cardiac Enzymes  Recent Labs Lab 05/16/17 1233 05/16/17 1820 05/17/17 0127  TROPONINI 0.03* 0.13* 0.10*    Glucose  Recent Labs Lab 05/16/17 2345 05/17/17 0406 05/17/17 1527 05/17/17 2010 05/17/17 2334 2017/05/22 0338  GLUCAP 265* 205* 206* 191* 193* 207*   IMAGING STUDIES:  -CXR 10/7 > mod to large R side hydropneumo, worsening bilateral interstitial and hazy airspace opacity concerning for a  combination of pneumonia and pulmonary edema  -CXR 10/8 > ETT in good position, slight decrease in size of right hydropneumothorax   -ABG 10/9 pH 7/27 > improved to 7.38 after 2 hours  -CXR 10/10 >> f/u  -CXR 10/11 >>   CULTURES:  Resp cx 10/8 >> rare budding yeast, cx reincubated for better growth  Bcx 10/8 >> NGx3D   ANTIBIOTICS: Azithro 10/7 >>  Meropenem 10/8>> Cefepime 10/8 > 10/8   SIGNIFICANT EVENTS: ETT 10/8 for worsening resp status on Bipap    LINES/TUBES: -ETT 10/8 >  -double lumen 10/8>  -PIV R arm 10/7 -PIV L posterior forearm 10/8 -PICC line  10/8   ASSESSMENT / PLAN:  PULMONARY Acute respiratory failure requiring mechanical ventilation  Continue on vent  Follow CXR   CARDIOVASCULAR Hypotension  On Levophed with goal MAP>65  CVP monitoring  Elevated troponins   RENAL Foley cath  Monitor SCr  (2.04>1.98>1.70 this AM)   Hyponatremia slowly improving  Replete electrolytes as needed   GASTROINTESTINAL H/o alcoholic cirrhosis with ascites  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  MELD score 24 and poor prognosis  Coagulopathy is stable  Continue lactulose and Rifaximin  Follow LFTs  NPO while vented- Continue TF  Protonix  GI following  Continuing supportive care for overall poor prognosis   HEMATOLOGIC H/o macrocytic anemia  FOBT neg on 10/4  No active bleeding  Continue to monitor   INFECTIOUS Septic shock  Normal cortisol and resolved lactic acidosis  Continue stress dose steroids - Solucortef  Continue Azithro and Meropenem  Persistently elevated WBC count concerning for possible infected hydrothorax/SBP    ENDOCRINE  No h/o diabetes Random blood cortisol level wnl  Continue Solucortef 50 mg IV Q6  CBG  in 190-200 range, on 5U Lantus nightly Increase to 10U Lantus  SSI  NEUROLOGIC Holding home Lexapro  Requiring mechanical ventilation  Fentanyl with versed PRN  RASS goal: -2   FAMILY  - Updates:  GOC family meeting with husband 10/9 > pt code status has been changed to DNR and will continue current support with no escalation.  Patient's husband in agreement that if no progress after 24-48 hours may want to transition to comfort care.     Freddrick March, MD  PGY-2, [facility] 05/22/2017 8:05 AM

## 2017-06-08 NOTE — Progress Notes (Signed)
Responded to page to support family at bedside. Patient is comfort care  Family is preparing to withdraw later today. Prayed with family and reinforced their faith.  Husband is beginning to cope better with decision to allow wife to transition. Family is a strong support to each other.  Provided empathetic listening, grief,emotional and spiritual support. Will follow as needed.    06/06/2017 1339  Clinical Encounter Type  Visited With Patient and family together;Health care provider  Visit Type Initial;Spiritual support;Patient actively dying  Referral From Nurse  Spiritual Encounters  Spiritual Needs Prayer;Emotional;Grief support  Stress Factors  Family Stress Factors Loss;Major life changes  Fae Pippin, [facility], Pager [phone]

## 2017-06-08 NOTE — Progress Notes (Signed)
200cc of Fentanyl drip wasted in sink; witnessed by Hermelinda Medicus, RN.

## 2017-06-08 NOTE — Discharge Summary (Signed)
Pulmonary Critical Care  Hospital Discharge Summary  Patient name: Rhonda Haley Medical record number: [id number] Date of birth: 1965-11-25 Age: 51 y.o. Gender: female Date of Admission: May 08, 2017  Date of Discharge: May 24, 2017 Admitting Physician: Ozella Rocks, MD  Primary Care Provider: Lewis Moccasin, MD Consultants: CCM, GI  Indication for Hospitalization: Sepsis  Discharge Diagnoses/Problem List:  Patient Active Problem List   Diagnosis Date Noted  . Acute respiratory failure with hypoxemia (HCC)   . Pressure injury of skin 05/17/2017  . Dyspnea   . Chest tube in place   . Hyperkalemia 05/08/2017  . AKI (acute kidney injury) (HCC) 05/08/2017  . Pleural effusion associated with hepatic disorder 05/03/2017  . Acute respiratory distress   . Fever, unspecified 05-08-2017  . Hydropneumothorax May 08, 2017  . Sepsis (HCC) May 08, 2017  . Anemia 2017-05-08  . Protein calorie malnutrition (HCC) 05/08/17  . Chronic hyponatremia 2017/05/08  . HCAP (healthcare-associated pneumonia) 05/08/17  . Murmur, cardiac 08-May-2017  . Abnormal MRI, kidney 03/30/2017  . Alcoholism (HCC)   . Alcoholic hepatitis with ascites 03/21/2017  . Coagulopathy (HCC) 03/21/2017  . Hyponatremia 03/21/2017  . Thrombocytopenia (HCC) 03/21/2017  . 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 Ascites due to alcoholic cirrhosis (HCC)   . Jaundice 03/09/2017  . Cholelithiasis 02/24/2017  . Abnormal MRI, liver 02/24/2017  . Protein-calorie malnutrition, severe 02/21/2017  . Normocytic anemia, not due to blood loss Jan 25, 2017  . Alcoholic cirrhosis of liver without ascites (HCC) 01-25-2017  . 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    Brief Hospital Course:  51 year old with chronic liver disease who presented with fevers chills and hypotension. She  had a significant white count with a left shift and she was started on empiric therapy for HCAP with a combination of vancomycin and cefepime. She has a known history of prior hepatic pleural effusion. She had a very large right pleural effusion on this admission which was drained with a pigtail catheter onSeptember 30. Fluid labs revealed it was transudative in nature. CT showed a right hydropneumothorax and no loculation along with diffuse pulmonary infiltrates. Given her worsening respiratory status on Bipap she was intubated and transferred to the ICU on morning of 10/8.  CXR was monitored for improvement and she was continued on antibiotics. Her hypotension improved slightly on pressor drips.   Given her history of alcoholic cirrhosis, and  decompensated cirrhosis, GI was consulted.  They deemed she had a grim prognosis although coagulopathy stable.  Rifaximin and Lactulose were continued and LFTs were monitored.  She made little to no improvement and in setting of her worsening renal function was in multiorgan system failure.  Family meeting was held to determine code status and goals of care.  She was changed to DNR and current support was continued. Husband was in agreement that if no progress made within another 24-48 hours they may want to withdraw care.   She passed on 05-24-2017 when decision was made to transition to comfort care.    Issues for Follow Up:  1. None   Significant Procedures:  Pleural effusion drain - 9/30  Transfer to CCM 10/8    ETT - 10/8     Significant Labs and Imaging:   Recent Labs Lab 05/16/17 0444 05/17/17 0547 2017-05-24 0528  WBC 30.2* 29.8* 29.1*  HGB 7.6* 8.1* 7.6*  HCT 23.8* 25.3* 24.0*  PLT  192 191 157    Recent Labs Lab 05/14/17 0922 05/15/17 0231 05/15/17 1607 05/16/17 0444 05/17/17 0140 05-30-2017 0528  NA 125* 126*  --  131* 130* 132*  K 5.3* 5.0  --  4.5 4.9 4.7  CL 92* 92*  --  97* 99* 100*  CO2 21* 20*  --  24 20* 21*  GLUCOSE 98 90  --   124* 243* 203*  BUN 74* 76*  --  85* 92* 102*  CREATININE 1.85* 2.03*  --  2.04* 1.98* 1.70*  CALCIUM 8.8* 8.7*  --  7.5* 7.6* 7.7*  MG  --   --  2.3 2.3 2.4 2.5*  PHOS  --   --  5.5* 4.7* 5.5* 5.2*  ALKPHOS  --   --   --   --  127* 129*  AST  --   --   --   --  53* 61*  ALT  --   --   --   --  48 46  ALBUMIN  --   --   --   --  2.1* 2.0*   Results/Tests Pending at Time of Discharge: None   Discharge Medications:  N/A  Discharge Instructions: Please refer to Patient Instructions section of EMR for full details.  Patient was counseled important signs and symptoms that should prompt return to medical care, changes in medications, dietary instructions, activity restrictions, and follow up appointments.    Freddrick March, MD 05/19/2017, 10:21 AM [facility] PGY-2

## 2017-06-08 NOTE — Progress Notes (Signed)
Patient's time of death: 70 Verified with Hermelinda Medicus, RN Family at bedside. CDS notified. Attending notified. Belongings sent with husband.

## 2017-06-08 DEATH — deceased

## 2017-06-28 ENCOUNTER — Ambulatory Visit: Payer: 59 | Admitting: Internal Medicine

## 2017-11-06 DEATH — deceased

## 2018-01-29 IMAGING — DX DG CHEST 2V
2 series · 2 of 2 positions shown · non-contrast
Comparison: Chest x-ray 02/21/2017

CLINICAL DATA: Shortness of breath.

EXAM:
CHEST  2 VIEW

[chest pa]
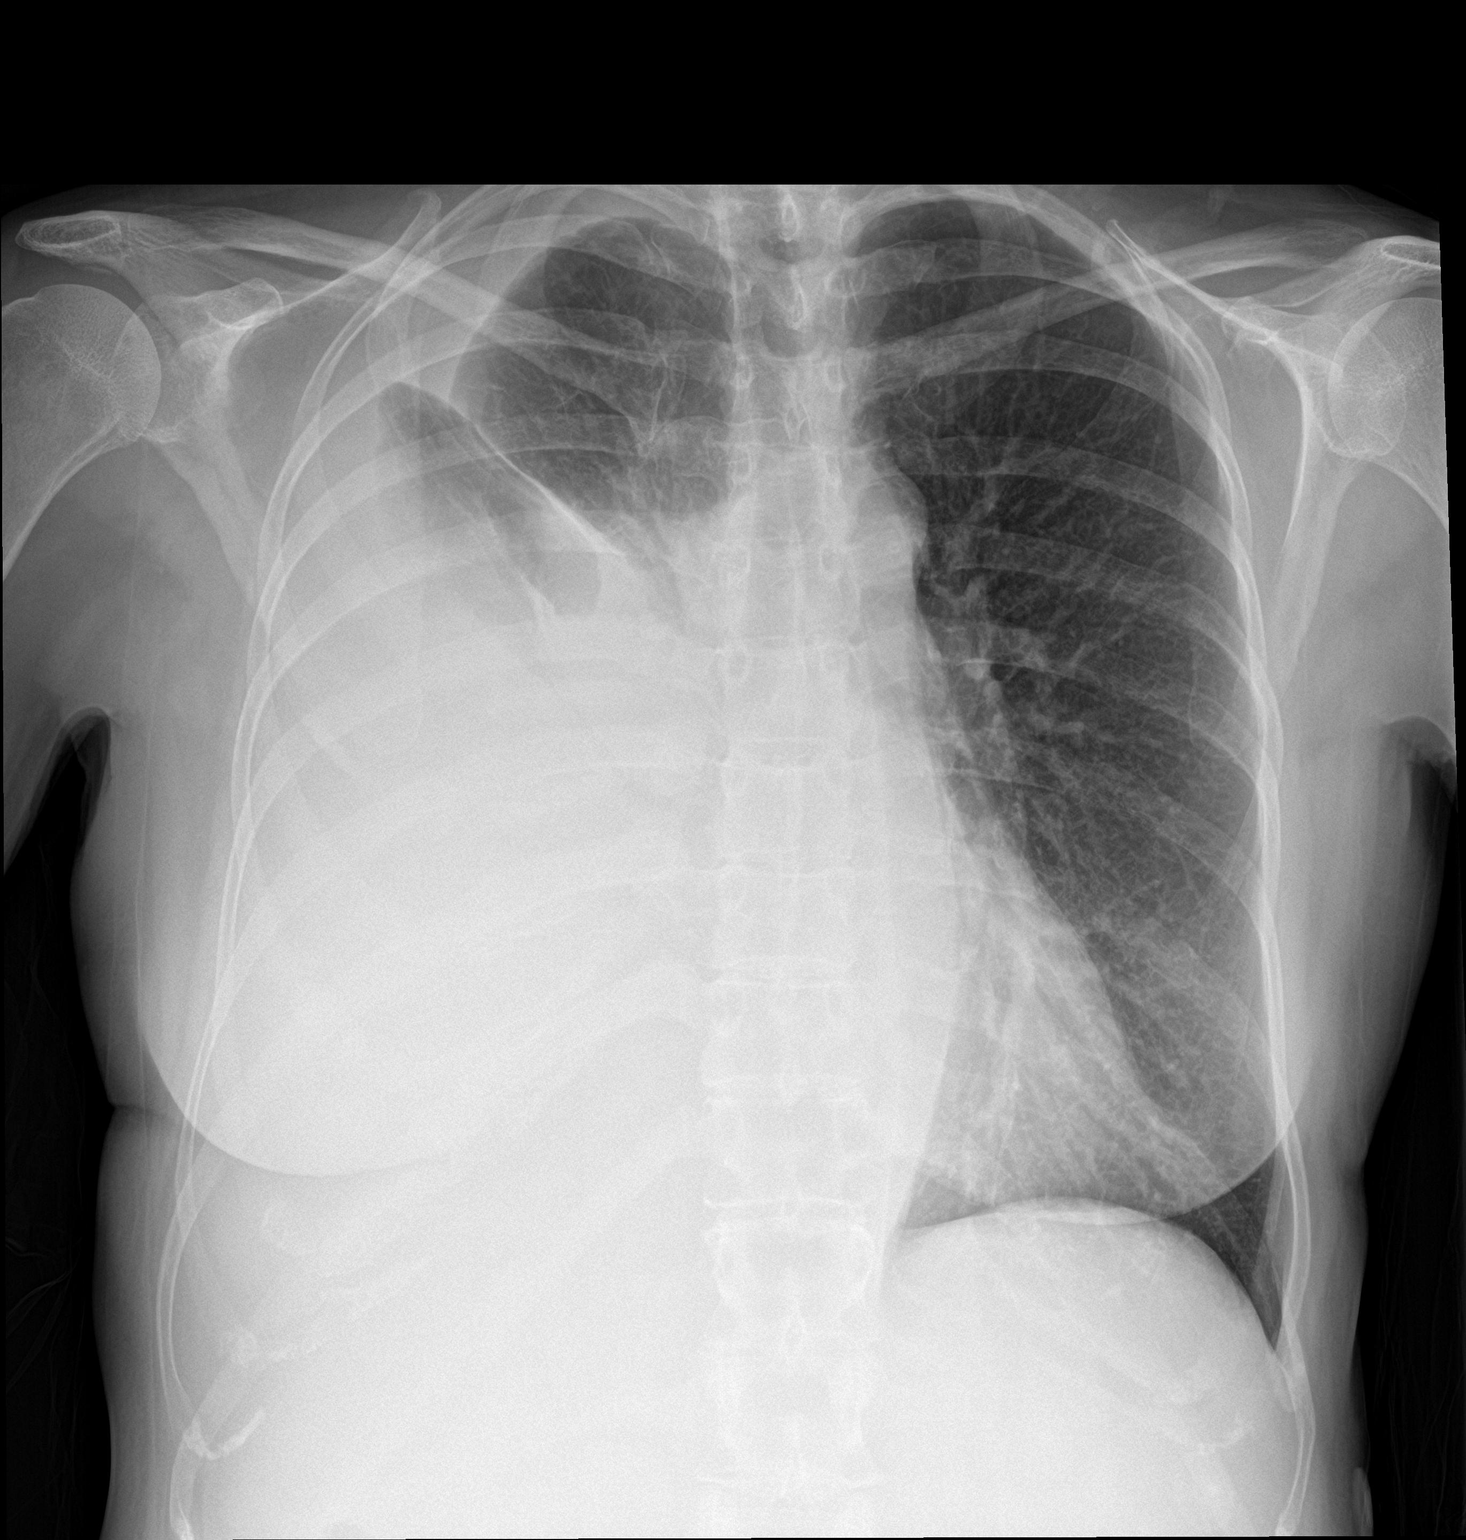

[chest lat]
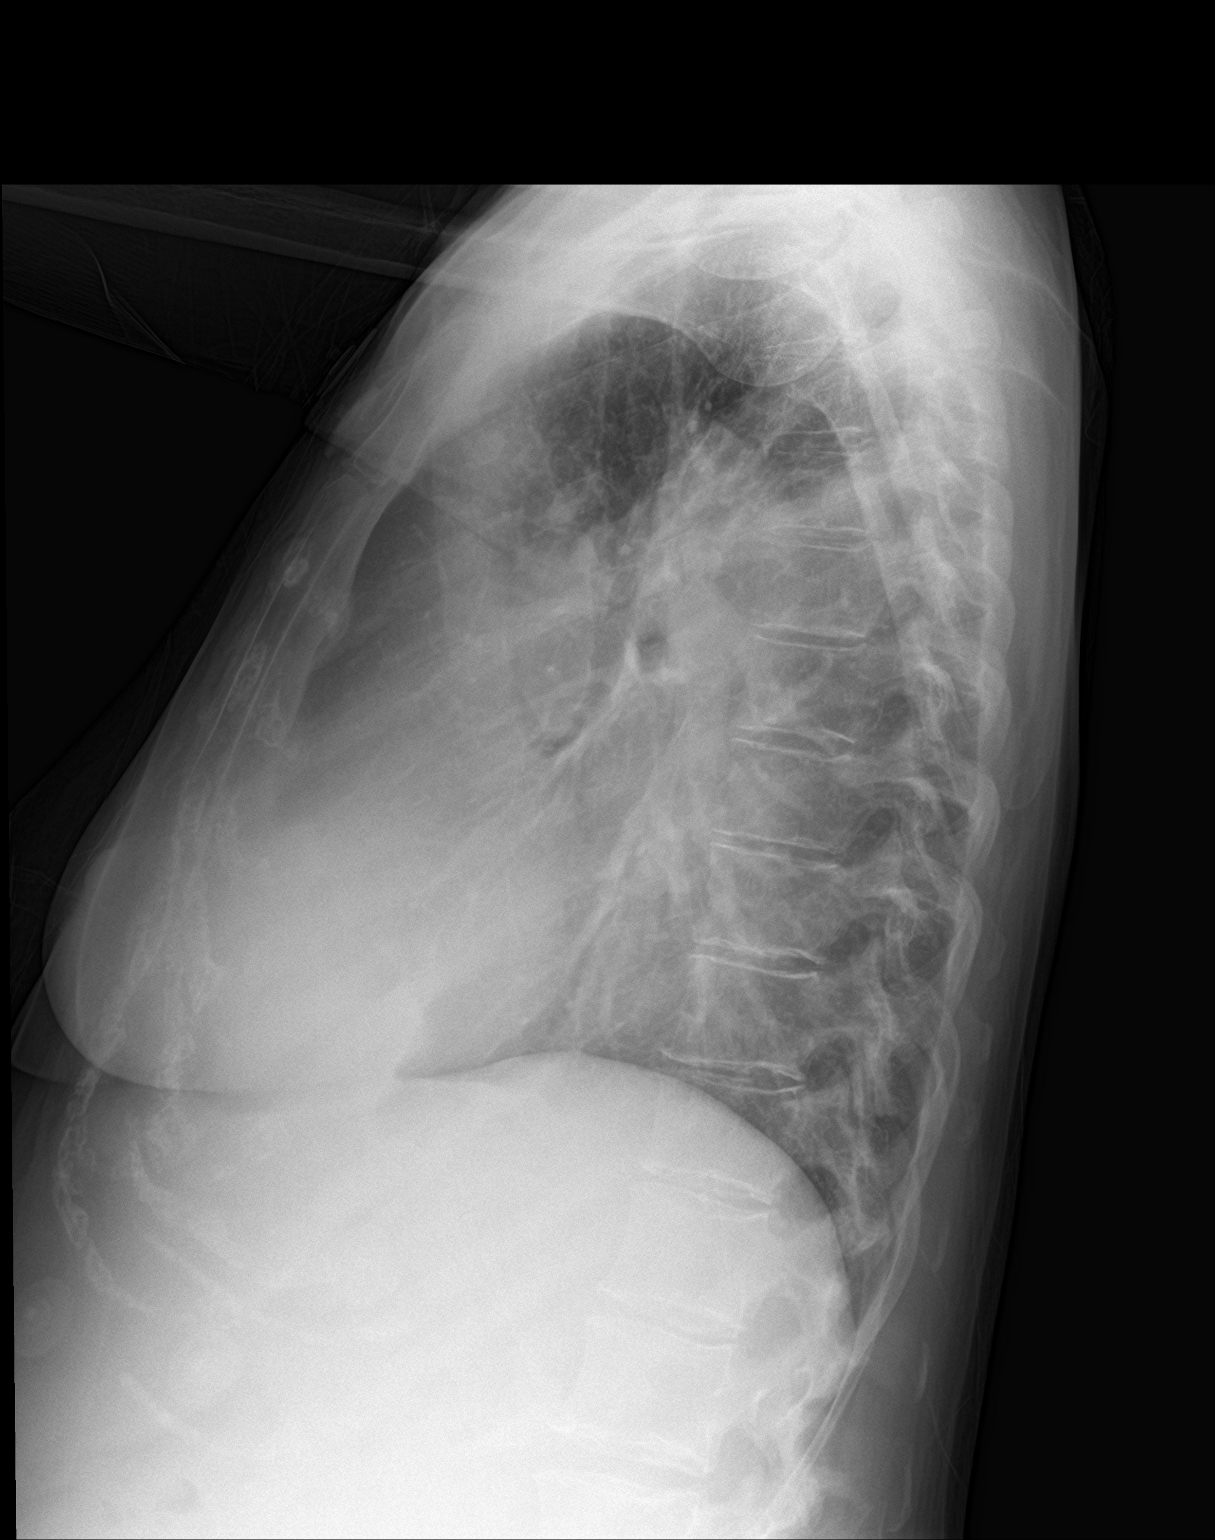

[2 of 2 positions shown; findings below may reference images not displayed]

FINDINGS: Large right-sided pleural effusion with overlying atelectasis. The
left lung is relatively clear. The cardiac silhouette, mediastinal
and hilar contours are grossly normal and stable. The bony thorax is
intact.
IMPRESSION: Large right pleural effusion.

## 2018-03-23 IMAGING — DX DG CHEST 1V PORT
1 series · 1 of 1 positions shown · non-contrast
Comparison: May 08, 2017

CLINICAL DATA: Pleural effusion with chest tube in place

EXAM:
PORTABLE CHEST 1 VIEW

[chest ap]
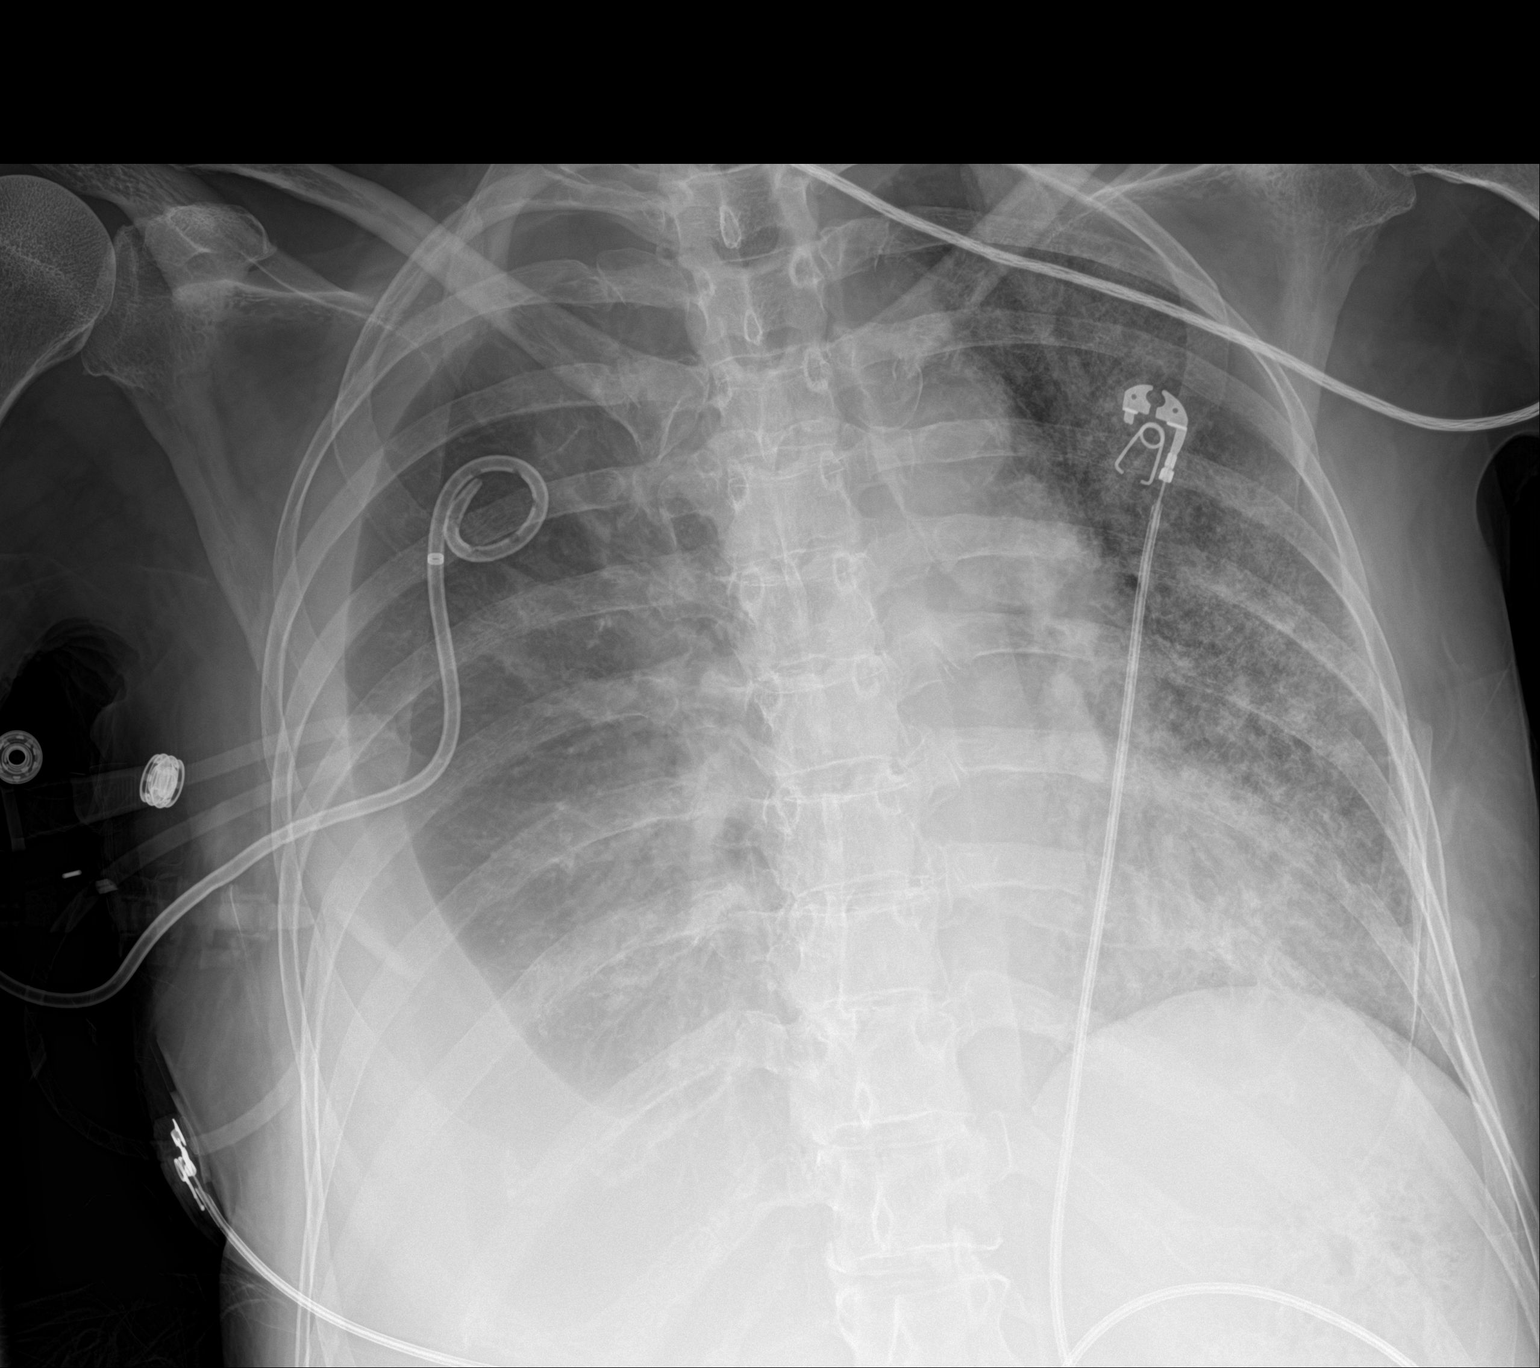

[1 of 1 positions shown; findings below may reference images not displayed]

FINDINGS: Chest tube remains on the right. There is a pneumothorax on the
right which appears smaller compared to 1 day prior. There may be a
slight tension component given slight shift of heart and mediastinum
toward the left. This finding is stable. There is a fairly sizable
partially loculated pleural effusion on the right. There is
underlying interstitial and patchy alveolar edema bilaterally. No
new opacity is evident. Heart is upper normal in size with a degree
of pulmonary venous hypertension evident. No evident adenopathy. No
bone lesions.
IMPRESSION: Persistent chest tube on the right without change in position. The
pneumothorax on the right appears smaller compared to 1 day prior.
There may be mild tension component, stable. There remains sizable
partially loculated pleural effusion on the right. There is a degree
of underlying edema, likely due to underlying congestive heart
failure. Patchy infiltrate on the left potentially could represent
superimposed pneumonia. These changes are stable. The cardiac
silhouette is stable.

## 2018-03-31 IMAGING — CR DG CHEST 1V PORT
1 series · 1 of 1 positions shown · non-contrast
Comparison: Chest x-ray of May 15, 2017

CLINICAL DATA: Community-acquired pneumonia, cirrhosis with ascites
healthcare associated pneumonia, current smoker.

EXAM:
PORTABLE CHEST 1 VIEW

[AP]
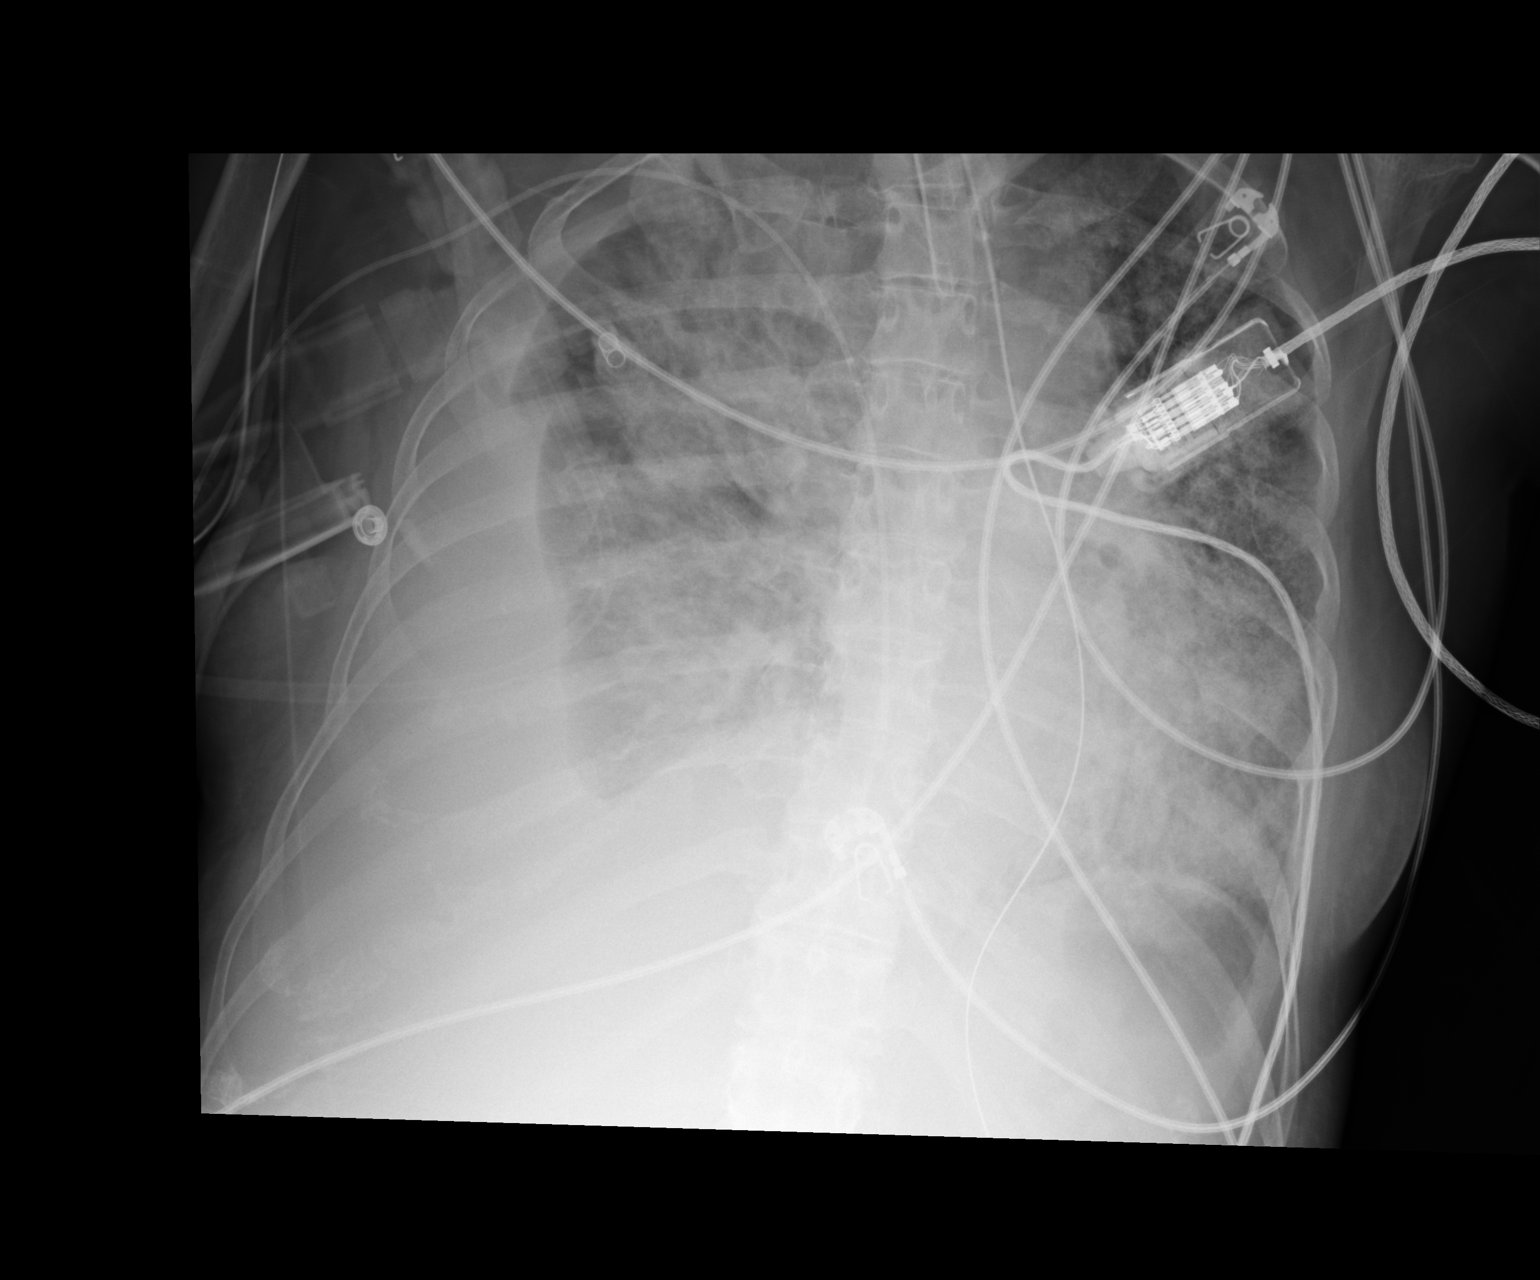

[1 of 1 positions shown; findings below may reference images not displayed]

FINDINGS: The lungs are adequately inflated. A large right pleural effusion
persists. The interstitial markings are both lungs are increased and
more conspicuous than on the study of 2 days ago. The cardiac
silhouette is not enlarged. The pulmonary vascularity is indistinct.
The endotracheal tube tip lies approximately 3.6 cm above the
carina. The esophagogastric tube tip projects below the inferior
margin of the image. The right-sided PICC line tip projects at the
cavoatrial junction.
IMPRESSION: Worsening of bilateral pneumonia. There may be superimposed
interstitial edema. Stable large right pleural effusion. The support
tubes are in reasonable position.

## 2018-04-01 IMAGING — DX DG CHEST 1V PORT
1 series · 1 of 1 positions shown · non-contrast
Comparison: 05/17/2017.

CLINICAL DATA: Community acquired pneumonia.

EXAM:
PORTABLE CHEST 1 VIEW

[chest ap]
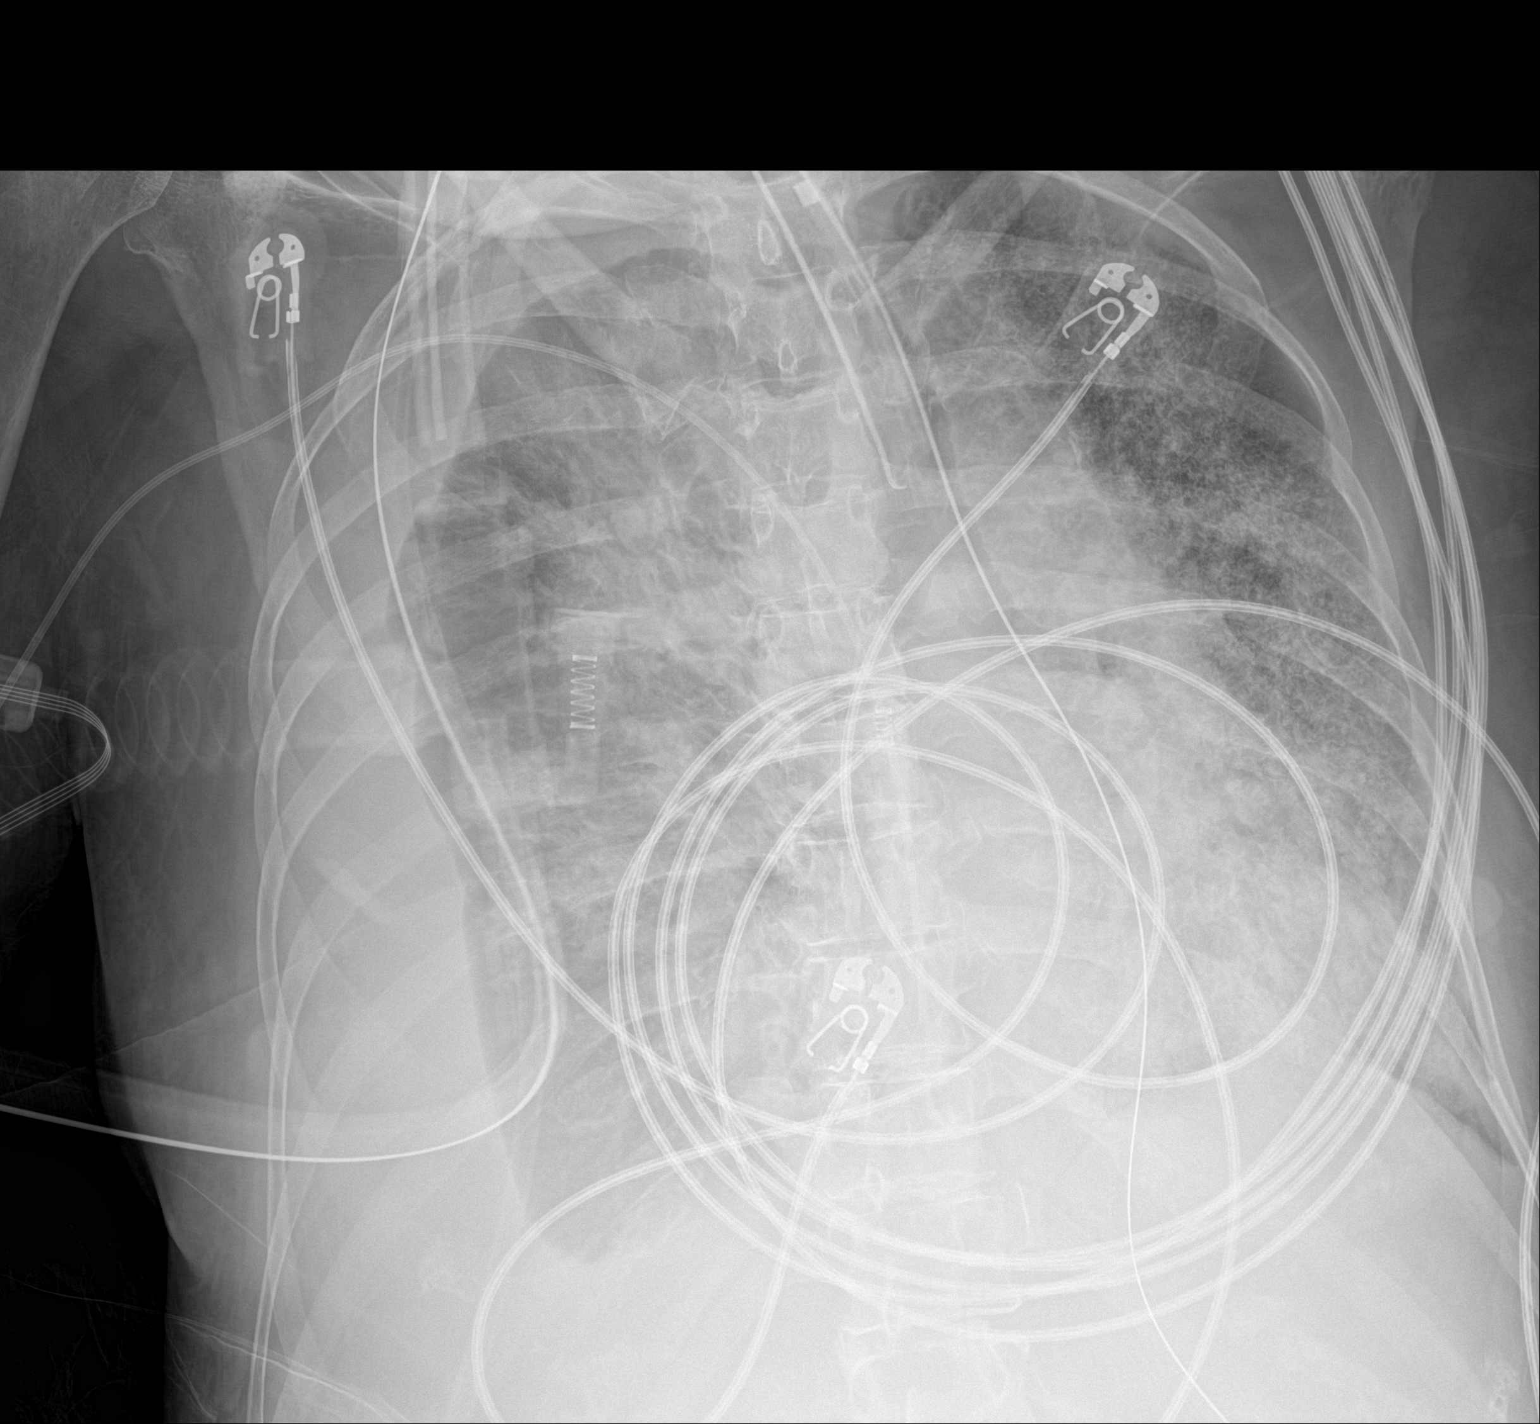

[1 of 1 positions shown; findings below may reference images not displayed]

FINDINGS: Endotracheal tube, NG tube, right PICC line stable position. Heart
size stable. Diffuse severe bilateral airspace disease and large
right pleural effusion again noted. No interim change.
IMPRESSION: 1. Lines and tubes in stable position.

2. Diffuse severe bilateral airspace disease and large right-sided
pleural effusion again noted. No interim change .
